# Patient Record
Sex: Female | Born: 1940 | Race: Black or African American | Hispanic: No | Smoking: Never smoker
Health system: Southern US, Community
[De-identification: ages and names within clinical notes are randomized; demographics above are authoritative.]

## PROBLEM LIST (undated history)

## (undated) DIAGNOSIS — I10 Essential (primary) hypertension: Secondary | ICD-10-CM

## (undated) DIAGNOSIS — M1990 Unspecified osteoarthritis, unspecified site: Secondary | ICD-10-CM

## (undated) DIAGNOSIS — I4891 Unspecified atrial fibrillation: Secondary | ICD-10-CM

## (undated) DIAGNOSIS — E079 Disorder of thyroid, unspecified: Secondary | ICD-10-CM

## (undated) DIAGNOSIS — E669 Obesity, unspecified: Secondary | ICD-10-CM

## (undated) DIAGNOSIS — E119 Type 2 diabetes mellitus without complications: Secondary | ICD-10-CM

## (undated) HISTORY — DX: Essential (primary) hypertension: I10

## (undated) HISTORY — DX: Type 2 diabetes mellitus without complications: E11.9

## (undated) HISTORY — DX: Disorder of thyroid, unspecified: E07.9

## (undated) HISTORY — DX: Obesity, unspecified: E66.9

## (undated) HISTORY — DX: Unspecified atrial fibrillation: I48.91

## (undated) HISTORY — PX: EYE SURGERY: SHX253

## (undated) HISTORY — PX: FOOT SURGERY: SHX648

## (undated) HISTORY — PX: TUBAL LIGATION: SHX77

## (undated) HISTORY — DX: Unspecified osteoarthritis, unspecified site: M19.90

## (undated) HISTORY — PX: ABDOMINAL HYSTERECTOMY: SHX81

## (undated) HISTORY — PX: ANKLE FRACTURE SURGERY: SHX122

## (undated) HISTORY — PX: GALLBLADDER SURGERY: SHX652

---

## 1998-03-07 ENCOUNTER — Other Ambulatory Visit: Admission: RE | Admit: 1998-03-07 | Discharge: 1998-03-07 | Payer: Self-pay | Admitting: Internal Medicine

## 1998-05-09 ENCOUNTER — Ambulatory Visit (HOSPITAL_COMMUNITY): Admission: RE | Admit: 1998-05-09 | Discharge: 1998-05-09 | Payer: Self-pay | Admitting: Internal Medicine

## 1998-05-13 ENCOUNTER — Other Ambulatory Visit: Admission: RE | Admit: 1998-05-13 | Discharge: 1998-05-13 | Payer: Self-pay | Admitting: Internal Medicine

## 1998-06-27 ENCOUNTER — Ambulatory Visit (HOSPITAL_COMMUNITY): Admission: RE | Admit: 1998-06-27 | Discharge: 1998-06-27 | Payer: Self-pay | Admitting: Internal Medicine

## 1999-03-23 ENCOUNTER — Emergency Department (HOSPITAL_COMMUNITY): Admission: EM | Admit: 1999-03-23 | Discharge: 1999-03-23 | Payer: Self-pay | Admitting: Emergency Medicine

## 1999-05-16 ENCOUNTER — Ambulatory Visit (HOSPITAL_COMMUNITY): Admission: RE | Admit: 1999-05-16 | Discharge: 1999-05-16 | Payer: Self-pay | Admitting: Internal Medicine

## 1999-07-05 ENCOUNTER — Other Ambulatory Visit: Admission: RE | Admit: 1999-07-05 | Discharge: 1999-07-05 | Payer: Self-pay | Admitting: Internal Medicine

## 2000-06-11 ENCOUNTER — Ambulatory Visit (HOSPITAL_COMMUNITY): Admission: RE | Admit: 2000-06-11 | Discharge: 2000-06-11 | Payer: Self-pay | Admitting: Internal Medicine

## 2000-06-11 ENCOUNTER — Encounter: Payer: Self-pay | Admitting: Internal Medicine

## 2001-02-28 ENCOUNTER — Encounter: Payer: Self-pay | Admitting: Emergency Medicine

## 2001-02-28 ENCOUNTER — Emergency Department (HOSPITAL_COMMUNITY): Admission: EM | Admit: 2001-02-28 | Discharge: 2001-02-28 | Payer: Self-pay | Admitting: Emergency Medicine

## 2001-06-19 ENCOUNTER — Encounter: Payer: Self-pay | Admitting: Internal Medicine

## 2001-06-19 ENCOUNTER — Ambulatory Visit (HOSPITAL_COMMUNITY): Admission: RE | Admit: 2001-06-19 | Discharge: 2001-06-19 | Payer: Self-pay | Admitting: Internal Medicine

## 2001-08-22 ENCOUNTER — Ambulatory Visit (HOSPITAL_BASED_OUTPATIENT_CLINIC_OR_DEPARTMENT_OTHER): Admission: RE | Admit: 2001-08-22 | Discharge: 2001-08-22 | Payer: Self-pay | Admitting: Orthopedic Surgery

## 2002-07-15 ENCOUNTER — Encounter: Payer: Self-pay | Admitting: Internal Medicine

## 2002-07-15 ENCOUNTER — Ambulatory Visit (HOSPITAL_COMMUNITY): Admission: RE | Admit: 2002-07-15 | Discharge: 2002-07-15 | Payer: Self-pay | Admitting: Internal Medicine

## 2003-09-14 ENCOUNTER — Encounter: Payer: Self-pay | Admitting: Internal Medicine

## 2003-09-14 ENCOUNTER — Ambulatory Visit (HOSPITAL_COMMUNITY): Admission: RE | Admit: 2003-09-14 | Discharge: 2003-09-14 | Payer: Self-pay | Admitting: Internal Medicine

## 2003-11-15 ENCOUNTER — Emergency Department (HOSPITAL_COMMUNITY): Admission: EM | Admit: 2003-11-15 | Discharge: 2003-11-15 | Payer: Self-pay | Admitting: Emergency Medicine

## 2004-08-29 ENCOUNTER — Ambulatory Visit (HOSPITAL_BASED_OUTPATIENT_CLINIC_OR_DEPARTMENT_OTHER): Admission: RE | Admit: 2004-08-29 | Discharge: 2004-08-29 | Payer: Self-pay | Admitting: Orthopedic Surgery

## 2004-08-29 ENCOUNTER — Ambulatory Visit (HOSPITAL_COMMUNITY): Admission: RE | Admit: 2004-08-29 | Discharge: 2004-08-29 | Payer: Self-pay | Admitting: Orthopedic Surgery

## 2006-01-24 ENCOUNTER — Ambulatory Visit (HOSPITAL_COMMUNITY): Admission: RE | Admit: 2006-01-24 | Discharge: 2006-01-24 | Payer: Self-pay | Admitting: Internal Medicine

## 2007-03-05 ENCOUNTER — Ambulatory Visit (HOSPITAL_COMMUNITY): Admission: RE | Admit: 2007-03-05 | Discharge: 2007-03-05 | Payer: Self-pay | Admitting: Internal Medicine

## 2008-09-14 ENCOUNTER — Ambulatory Visit (HOSPITAL_COMMUNITY): Admission: RE | Admit: 2008-09-14 | Discharge: 2008-09-14 | Payer: Self-pay | Admitting: Internal Medicine

## 2008-11-15 ENCOUNTER — Ambulatory Visit (HOSPITAL_COMMUNITY): Admission: RE | Admit: 2008-11-15 | Discharge: 2008-11-15 | Payer: Self-pay | Admitting: Internal Medicine

## 2009-02-23 ENCOUNTER — Encounter (INDEPENDENT_AMBULATORY_CARE_PROVIDER_SITE_OTHER): Payer: Self-pay | Admitting: Surgery

## 2009-02-24 ENCOUNTER — Inpatient Hospital Stay (HOSPITAL_COMMUNITY): Admission: RE | Admit: 2009-02-24 | Discharge: 2009-02-25 | Payer: Self-pay | Admitting: Surgery

## 2009-08-18 ENCOUNTER — Emergency Department (HOSPITAL_COMMUNITY): Admission: EM | Admit: 2009-08-18 | Discharge: 2009-08-18 | Payer: Self-pay | Admitting: Family Medicine

## 2009-08-19 ENCOUNTER — Inpatient Hospital Stay (HOSPITAL_COMMUNITY): Admission: EM | Admit: 2009-08-19 | Discharge: 2009-08-23 | Payer: Self-pay | Admitting: Emergency Medicine

## 2009-09-15 ENCOUNTER — Ambulatory Visit (HOSPITAL_COMMUNITY): Admission: RE | Admit: 2009-09-15 | Discharge: 2009-09-15 | Payer: Self-pay | Admitting: Internal Medicine

## 2010-06-07 ENCOUNTER — Inpatient Hospital Stay (HOSPITAL_COMMUNITY): Admission: EM | Admit: 2010-06-07 | Discharge: 2010-06-11 | Payer: Self-pay | Admitting: Emergency Medicine

## 2010-09-18 ENCOUNTER — Emergency Department (HOSPITAL_COMMUNITY)
Admission: EM | Admit: 2010-09-18 | Discharge: 2010-09-19 | Payer: Self-pay | Source: Home / Self Care | Admitting: Emergency Medicine

## 2010-09-19 ENCOUNTER — Ambulatory Visit (HOSPITAL_COMMUNITY): Admission: RE | Admit: 2010-09-19 | Discharge: 2010-09-19 | Payer: Self-pay | Admitting: Internal Medicine

## 2010-12-16 ENCOUNTER — Encounter: Payer: Self-pay | Admitting: Internal Medicine

## 2010-12-17 ENCOUNTER — Encounter: Payer: Self-pay | Admitting: Internal Medicine

## 2011-02-07 LAB — BASIC METABOLIC PANEL
BUN: 15 mg/dL (ref 6–23)
Chloride: 109 mEq/L (ref 96–112)
Creatinine, Ser: 0.88 mg/dL (ref 0.4–1.2)
GFR calc Af Amer: >60 mL/min (ref >60)
GFR calc non Af Amer: >60 mL/min (ref >60)
Glucose, Bld: 141 mg/dL — ABNORMAL HIGH (ref 70–99)
Potassium: 3.7 mEq/L (ref 3.5–5.1)
Sodium: 143 mEq/L (ref 135–145)

## 2011-02-07 LAB — POCT CARDIAC MARKERS
CKMB, poc: <1 ng/mL — ABNORMAL LOW (ref 1.0–8.0)
Myoglobin, poc: 46.4 ng/mL (ref 12–200)
Troponin i, poc: <0.05 ng/mL (ref 0.00–0.09)

## 2011-02-07 LAB — DIFFERENTIAL
Basophils Relative: 0 % (ref 0–1)
Eosinophils Relative: 1 % (ref 0–5)
Lymphocytes Relative: 33 % (ref 12–46)
Monocytes Absolute: 0.5 10*3/uL (ref 0.1–1.0)
Monocytes Relative: 9 % (ref 3–12)

## 2011-02-07 LAB — CBC
MCH: 30.2 pg (ref 26.0–34.0)
MCHC: 32.4 g/dL (ref 30.0–36.0)
Platelets: 217 10*3/uL (ref 150–400)
WBC: 5.5 10*3/uL (ref 4.0–10.5)

## 2011-02-10 LAB — COMPREHENSIVE METABOLIC PANEL
ALT: 16 U/L (ref 0–35)
AST: 29 U/L (ref 0–37)
CO2: 28 mEq/L (ref 19–32)
Chloride: 102 mEq/L (ref 96–112)
Creatinine, Ser: 0.97 mg/dL (ref 0.4–1.2)
GFR calc Af Amer: >60 mL/min (ref >60)
GFR calc non Af Amer: 57 mL/min — ABNORMAL LOW (ref >60)
Total Bilirubin: 0.4 mg/dL (ref 0.3–1.2)

## 2011-02-10 LAB — BASIC METABOLIC PANEL
GFR calc Af Amer: >60 mL/min (ref >60)
GFR calc non Af Amer: 55 mL/min — ABNORMAL LOW (ref >60)
Glucose, Bld: 153 mg/dL — ABNORMAL HIGH (ref 70–99)
Potassium: 5.1 mEq/L (ref 3.5–5.1)

## 2011-02-10 LAB — VITAMIN D 1,25 DIHYDROXY
Vitamin D 1, 25 (OH)2 Total: 32 pg/mL (ref 18–72)
Vitamin D2 1, 25 (OH)2: <8 pg/mL
Vitamin D3 1, 25 (OH)2: 32 pg/mL

## 2011-02-10 LAB — GLUCOSE, CAPILLARY
Glucose-Capillary: 124 mg/dL — ABNORMAL HIGH (ref 70–99)
Glucose-Capillary: 127 mg/dL — ABNORMAL HIGH (ref 70–99)
Glucose-Capillary: 131 mg/dL — ABNORMAL HIGH (ref 70–99)
Glucose-Capillary: 158 mg/dL — ABNORMAL HIGH (ref 70–99)

## 2011-02-10 LAB — VITAMIN D 25 HYDROXY (VIT D DEFICIENCY, FRACTURES): Vit D, 25-Hydroxy: 13 ng/mL — ABNORMAL LOW (ref 30–89)

## 2011-02-10 LAB — CBC
HCT: 26.3 % — ABNORMAL LOW (ref 36.0–46.0)
Hemoglobin: 10.1 g/dL — ABNORMAL LOW (ref 12.0–15.0)
Hemoglobin: 8.9 g/dL — ABNORMAL LOW (ref 12.0–15.0)
MCH: 32.5 pg (ref 26.0–34.0)
MCV: 96.1 fL (ref 78.0–100.0)
RBC: 2.74 MIL/uL — ABNORMAL LOW (ref 3.87–5.11)

## 2011-02-11 LAB — GLUCOSE, CAPILLARY
Glucose-Capillary: 113 mg/dL — ABNORMAL HIGH (ref 70–99)
Glucose-Capillary: 114 mg/dL — ABNORMAL HIGH (ref 70–99)
Glucose-Capillary: 125 mg/dL — ABNORMAL HIGH (ref 70–99)
Glucose-Capillary: 133 mg/dL — ABNORMAL HIGH (ref 70–99)
Glucose-Capillary: 162 mg/dL — ABNORMAL HIGH (ref 70–99)

## 2011-02-11 LAB — DIFFERENTIAL
Eosinophils Absolute: 0 10*3/uL (ref 0.0–0.7)
Eosinophils Relative: 1 % (ref 0–5)
Lymphocytes Relative: 34 % (ref 12–46)
Lymphs Abs: 2.7 10*3/uL (ref 0.7–4.0)
Monocytes Relative: 7 % (ref 3–12)
Neutrophils Relative %: 58 % (ref 43–77)

## 2011-02-11 LAB — COMPREHENSIVE METABOLIC PANEL
ALT: 24 U/L (ref 0–35)
AST: 28 U/L (ref 0–37)
AST: 36 U/L (ref 0–37)
BUN: 7 mg/dL (ref 6–23)
CO2: 28 mEq/L (ref 19–32)
Calcium: 8.7 mg/dL (ref 8.4–10.5)
Calcium: 8.8 mg/dL (ref 8.4–10.5)
Creatinine, Ser: 0.95 mg/dL (ref 0.4–1.2)
GFR calc Af Amer: 59 mL/min — ABNORMAL LOW (ref >60)
GFR calc Af Amer: >60 mL/min (ref >60)
GFR calc non Af Amer: 59 mL/min — ABNORMAL LOW (ref >60)
Sodium: 137 mEq/L (ref 135–145)
Total Protein: 7.1 g/dL (ref 6.0–8.3)

## 2011-02-11 LAB — CBC
MCHC: 33.8 g/dL (ref 30.0–36.0)
RDW: 13.9 % (ref 11.5–15.5)

## 2011-02-11 LAB — PROTIME-INR: Prothrombin Time: 13.3 seconds (ref 11.6–15.2)

## 2011-03-02 LAB — COMPREHENSIVE METABOLIC PANEL
ALT: 159 U/L — ABNORMAL HIGH (ref 0–35)
ALT: 324 U/L — ABNORMAL HIGH (ref 0–35)
ALT: 577 U/L — ABNORMAL HIGH (ref 0–35)
AST: 176 U/L — ABNORMAL HIGH (ref 0–37)
AST: 419 U/L — ABNORMAL HIGH (ref 0–37)
AST: 742 U/L — ABNORMAL HIGH (ref 0–37)
Albumin: 3.9 g/dL (ref 3.5–5.2)
Albumin: 4.1 g/dL (ref 3.5–5.2)
Alkaline Phosphatase: 178 U/L — ABNORMAL HIGH (ref 39–117)
Alkaline Phosphatase: 257 U/L — ABNORMAL HIGH (ref 39–117)
BUN: 14 mg/dL (ref 6–23)
CO2: 26 mEq/L (ref 19–32)
CO2: 28 mEq/L (ref 19–32)
Calcium: 8.6 mg/dL (ref 8.4–10.5)
Chloride: 101 mEq/L (ref 96–112)
Chloride: 103 mEq/L (ref 96–112)
Chloride: 104 mEq/L (ref 96–112)
Creatinine, Ser: 0.88 mg/dL (ref 0.4–1.2)
Creatinine, Ser: 1.19 mg/dL (ref 0.4–1.2)
GFR calc Af Amer: 55 mL/min — ABNORMAL LOW (ref >60)
GFR calc Af Amer: >60 mL/min (ref >60)
GFR calc Af Amer: >60 mL/min (ref >60)
GFR calc non Af Amer: 52 mL/min — ABNORMAL LOW (ref >60)
GFR calc non Af Amer: >60 mL/min (ref >60)
Glucose, Bld: 127 mg/dL — ABNORMAL HIGH (ref 70–99)
Glucose, Bld: 80 mg/dL (ref 70–99)
Glucose, Bld: 87 mg/dL (ref 70–99)
Potassium: 3.5 mEq/L (ref 3.5–5.1)
Potassium: 3.9 mEq/L (ref 3.5–5.1)
Sodium: 139 mEq/L (ref 135–145)
Sodium: 140 mEq/L (ref 135–145)
Sodium: 145 mEq/L (ref 135–145)
Total Bilirubin: 5.2 mg/dL — ABNORMAL HIGH (ref 0.3–1.2)
Total Bilirubin: 6 mg/dL — ABNORMAL HIGH (ref 0.3–1.2)
Total Bilirubin: 6.3 mg/dL — ABNORMAL HIGH (ref 0.3–1.2)
Total Protein: 5.5 g/dL — ABNORMAL LOW (ref 6.0–8.3)
Total Protein: 5.6 g/dL — ABNORMAL LOW (ref 6.0–8.3)

## 2011-03-02 LAB — GLUCOSE, CAPILLARY
Glucose-Capillary: 101 mg/dL — ABNORMAL HIGH (ref 70–99)
Glucose-Capillary: 102 mg/dL — ABNORMAL HIGH (ref 70–99)
Glucose-Capillary: 106 mg/dL — ABNORMAL HIGH (ref 70–99)
Glucose-Capillary: 108 mg/dL — ABNORMAL HIGH (ref 70–99)
Glucose-Capillary: 132 mg/dL — ABNORMAL HIGH (ref 70–99)
Glucose-Capillary: 133 mg/dL — ABNORMAL HIGH (ref 70–99)
Glucose-Capillary: 140 mg/dL — ABNORMAL HIGH (ref 70–99)
Glucose-Capillary: 149 mg/dL — ABNORMAL HIGH (ref 70–99)
Glucose-Capillary: 69 mg/dL — ABNORMAL LOW (ref 70–99)
Glucose-Capillary: 78 mg/dL (ref 70–99)
Glucose-Capillary: 85 mg/dL (ref 70–99)
Glucose-Capillary: 86 mg/dL (ref 70–99)
Glucose-Capillary: 88 mg/dL (ref 70–99)
Glucose-Capillary: 93 mg/dL (ref 70–99)
Glucose-Capillary: 96 mg/dL (ref 70–99)
Glucose-Capillary: 99 mg/dL (ref 70–99)

## 2011-03-02 LAB — BASIC METABOLIC PANEL
BUN: 3 mg/dL — ABNORMAL LOW (ref 6–23)
CO2: 29 mEq/L (ref 19–32)
Calcium: 8.5 mg/dL (ref 8.4–10.5)
Chloride: 112 mEq/L (ref 96–112)
Creatinine, Ser: 0.96 mg/dL (ref 0.4–1.2)
GFR calc Af Amer: >60 mL/min (ref >60)
GFR calc non Af Amer: 58 mL/min — ABNORMAL LOW (ref >60)
Glucose, Bld: 130 mg/dL — ABNORMAL HIGH (ref 70–99)
Potassium: 3.6 mEq/L (ref 3.5–5.1)
Sodium: 144 mEq/L (ref 135–145)

## 2011-03-02 LAB — URINE MICROSCOPIC-ADD ON

## 2011-03-02 LAB — HEPATIC FUNCTION PANEL
ALT: 213 U/L — ABNORMAL HIGH (ref 0–35)
ALT: 242 U/L — ABNORMAL HIGH (ref 0–35)
AST: 86 U/L — ABNORMAL HIGH (ref 0–37)
Albumin: 3.1 g/dL — ABNORMAL LOW (ref 3.5–5.2)
Alkaline Phosphatase: 166 U/L — ABNORMAL HIGH (ref 39–117)
Bilirubin, Direct: 0.5 mg/dL — ABNORMAL HIGH (ref 0.0–0.3)
Indirect Bilirubin: 0.5 mg/dL (ref 0.3–0.9)
Indirect Bilirubin: 0.7 mg/dL (ref 0.3–0.9)
Total Bilirubin: 1 mg/dL (ref 0.3–1.2)
Total Protein: 5.5 g/dL — ABNORMAL LOW (ref 6.0–8.3)
Total Protein: 6.3 g/dL (ref 6.0–8.3)

## 2011-03-02 LAB — CBC
HCT: 23.5 % — ABNORMAL LOW (ref 36.0–46.0)
HCT: 33.5 % — ABNORMAL LOW (ref 36.0–46.0)
Hemoglobin: 11.4 g/dL — ABNORMAL LOW (ref 12.0–15.0)
Hemoglobin: 8.1 g/dL — ABNORMAL LOW (ref 12.0–15.0)
MCHC: 34.3 g/dL (ref 30.0–36.0)
MCHC: 34.5 g/dL (ref 30.0–36.0)
MCV: 93.8 fL (ref 78.0–100.0)
MCV: 94.7 fL (ref 78.0–100.0)
MCV: 94.8 fL (ref 78.0–100.0)
Platelets: 162 10*3/uL (ref 150–400)
Platelets: 208 10*3/uL (ref 150–400)
RBC: 2.48 MIL/uL — ABNORMAL LOW (ref 3.87–5.11)
RDW: 14.9 % (ref 11.5–15.5)
RDW: 14.9 % (ref 11.5–15.5)
WBC: 10.3 10*3/uL (ref 4.0–10.5)
WBC: 12.3 10*3/uL — ABNORMAL HIGH (ref 4.0–10.5)
WBC: 6.2 10*3/uL (ref 4.0–10.5)

## 2011-03-02 LAB — URINALYSIS, ROUTINE W REFLEX MICROSCOPIC
Glucose, UA: NEGATIVE mg/dL
Ketones, ur: 40 mg/dL — AB
Protein, ur: 30 mg/dL — AB

## 2011-03-02 LAB — LIPASE, BLOOD: Lipase: 26 U/L (ref 11–59)

## 2011-03-02 LAB — DIFFERENTIAL
Basophils Absolute: 0 10*3/uL (ref 0.0–0.1)
Eosinophils Relative: 0 % (ref 0–5)
Lymphocytes Relative: 2 % — ABNORMAL LOW (ref 12–46)
Lymphs Abs: 0.3 10*3/uL — ABNORMAL LOW (ref 0.7–4.0)
Monocytes Absolute: 0.6 10*3/uL (ref 0.1–1.0)
Monocytes Relative: 5 % (ref 3–12)

## 2011-03-02 LAB — RAPID URINE DRUG SCREEN, HOSP PERFORMED
Amphetamines: NOT DETECTED
Barbiturates: NOT DETECTED
Benzodiazepines: NOT DETECTED
Cocaine: NOT DETECTED

## 2011-03-02 LAB — POCT URINALYSIS DIP (DEVICE): Ketones, ur: 80 mg/dL — AB

## 2011-03-02 LAB — ACETAMINOPHEN LEVEL: Acetaminophen (Tylenol), Serum: <10 ug/mL — ABNORMAL LOW (ref 10–30)

## 2011-03-02 LAB — APTT: aPTT: 24 seconds (ref 24–37)

## 2011-03-02 LAB — URINE CULTURE: Colony Count: 50000

## 2011-03-02 LAB — CULTURE, BLOOD (ROUTINE X 2): Culture: NO GROWTH

## 2011-03-02 LAB — HEPATITIS B SURFACE ANTIGEN: Hepatitis B Surface Ag: NEGATIVE

## 2011-03-02 LAB — POCT CARDIAC MARKERS: Myoglobin, poc: 147 ng/mL (ref 12–200)

## 2011-03-02 LAB — CALCIUM: Calcium: 8.1 mg/dL — ABNORMAL LOW (ref 8.4–10.5)

## 2011-03-07 LAB — CBC
Hemoglobin: 8.3 g/dL — ABNORMAL LOW (ref 12.0–15.0)
MCHC: 34.1 g/dL (ref 30.0–36.0)
RBC: 2.61 MIL/uL — ABNORMAL LOW (ref 3.87–5.11)
WBC: 8.2 10*3/uL (ref 4.0–10.5)

## 2011-03-07 LAB — COMPREHENSIVE METABOLIC PANEL
ALT: 35 U/L (ref 0–35)
AST: 40 U/L — ABNORMAL HIGH (ref 0–37)
Alkaline Phosphatase: 59 U/L (ref 39–117)
CO2: 30 mEq/L (ref 19–32)
Calcium: 8.3 mg/dL — ABNORMAL LOW (ref 8.4–10.5)
Chloride: 107 mEq/L (ref 96–112)
GFR calc Af Amer: >60 mL/min (ref >60)
GFR calc non Af Amer: 60 mL/min — ABNORMAL LOW (ref >60)
Glucose, Bld: 125 mg/dL — ABNORMAL HIGH (ref 70–99)
Potassium: 3.1 mEq/L — ABNORMAL LOW (ref 3.5–5.1)
Sodium: 141 mEq/L (ref 135–145)
Total Bilirubin: 0.6 mg/dL (ref 0.3–1.2)

## 2011-03-08 LAB — GLUCOSE, CAPILLARY: Glucose-Capillary: 119 mg/dL — ABNORMAL HIGH (ref 70–99)

## 2011-03-08 LAB — BASIC METABOLIC PANEL
BUN: 13 mg/dL (ref 6–23)
Calcium: 8.4 mg/dL (ref 8.4–10.5)
Chloride: 100 mEq/L (ref 96–112)
Creatinine, Ser: 1.2 mg/dL (ref 0.4–1.2)
GFR calc Af Amer: 54 mL/min — ABNORMAL LOW (ref >60)
GFR calc non Af Amer: 45 mL/min — ABNORMAL LOW (ref >60)

## 2011-03-08 LAB — HEMOGLOBIN AND HEMATOCRIT, BLOOD
HCT: 28.3 % — ABNORMAL LOW (ref 36.0–46.0)
Hemoglobin: 9.6 g/dL — ABNORMAL LOW (ref 12.0–15.0)

## 2011-04-10 NOTE — Discharge Summary (Signed)
NAMEMEERA, Darlene Boyer              ACCOUNT NO.:  [id number]   MEDICAL RECORD NO.:  [id number]          PATIENT TYPE:  INP   LOCATION:  1523                         FACILITY:  [facility]   PHYSICIAN:  Thornton Park. Daphine Deutscher, MD  DATE OF BIRTH:  1941-06-20   DATE OF ADMISSION:  02/23/2009  DATE OF DISCHARGE:  02/25/2009                               DISCHARGE SUMMARY   ADMISSION DIAGNOSIS:  Abdominal pain and gallstones.   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   PROCEDURE:  Laparoscopic cholecystectomy with intraoperative  cholangiogram.   HOSPITAL COURSE:  Darlene Boyer is a 70 year old African American lady  with abdominal pain and gallstones.  She had fairly remarkable  cholecystitis of a chronic nature which was taken care of surgically.  She stayed through postop day two and was ready for discharge.  She had  a low-grade fever the night before but constitutionally did not have any  complaints.  Laboratory on the day of admission revealed that her anemia  persisted at this time with hemoglobin 8.3, white count 8200 and  potassium 3.1.  She was encouraged to increase bananas and other organic  sources of potassium chloride in her diet.  More importantly, she has  been requested to follow up with Dr. Margaretmary Bayley to schedule anemia  workup which may include colonoscopy.  I mentioned that to her as well.  We will encourage her to take some ferrous sulfate in the meantime.  I  asked her to come back to see me in the office in about three weeks.  She was given a prescription for Tylox to take for pain.   DISCHARGE MEDICATIONS:  1. Hyzaar 100/12.5.  2. Arthrotec 75 mg.  3. Lorazepam.  4. Tylenol p.r.n.      Thornton Park Daphine Deutscher, MD  Electronically Signed     MBM/MEDQ  D:  02/25/2009  T:  02/25/2009  Job:  [id number]

## 2011-04-10 NOTE — Op Note (Signed)
NAMENOELANI, HARBACH              ACCOUNT NO.:  [id number]   MEDICAL RECORD NO.:  [id number]          PATIENT TYPE:  OIB   LOCATION:  1523                         FACILITY:  [facility]   PHYSICIAN:  Thornton Park. Daphine Deutscher, MD  DATE OF BIRTH:  [birth year]   DATE OF PROCEDURE:  02/23/2009  DATE OF DISCHARGE:                               OPERATIVE REPORT   PREOPERATIVE DIAGNOSIS:  Chronic cholecystitis.   POSTOPERATIVE DIAGNOSES:  Severe chronic cholecystitis with numerous  adhesions and subacute changes of the gallbladder consistent with  chronically impacted gallstones in the neck of the gallbladder.   PROCEDURE:  Laparoscopic cholecystectomy with enterolysis and  intraoperative cholangiogram.   SURGEON:  Thornton Park. Daphine Deutscher, MD   ASSISTANT:  Currie Paris, M.D.   ANESTHESIA:  General.   DESCRIPTION OF PROCEDURE:  A 70 year old African American lady with  upper abdominal pain and gallstones.  She was taken to OR one on February 23, 2009, given general anesthesia. The abdomen was prepped with the  [facility] equipment and draped sterilely.  Access to the abdomen was  gained through the umbilicus, cutting down into the umbilicus.  She had  a lot of scar tissue there and she had a wound that healed secondarily,  but we entered and then found a bunch of adhesions.  After placing the  multiple trocars for our gallbladder I went ahead and took those down  sharply.  The gallbladder itself was adherent to the colon up on the  fundus and we took those adhesions away.  Elevated the gallbladder and  then stripped it down to the neck where it was really adherent and the  anatomy was somewhat confusing.  The neck of the gallbladder was  followed down to what was a very big cystic duct into the common duct.  The critical view was achieved.  I went ahead and made an incision up of  the gallbladder and cystic duct and milked back a lot of pasty material  and small stones.  I then did a dynamic  cholangiogram, inserting the  Reddick catheter 2 cm into the common duct and taking that showing a  very dilated common duct with flow into the duodenum.  There may be what  was sludge in the common duct that may need to be retrieved  endoscopically with ERCP later.  In the meantime, we went ahead and did  debulked the cystic duct remnants as best we could of the particulate  matter consistent with old stones.  The cystic duct was then ligated  with a PDS Lapra-Ty and was transected.  The gallbladder was then  removed from the gallbladder bed with hook electrocautery and placed in  a bag and brought out through the umbilicus.  I irrigated and removed  some of the material that spilled and seemed to get all of that.  There  was no bleeding or bile leaks noted.  The umbilical defect  was repaired with laparoscopic vision with three sutures of zero Vicryl,  4-0 Vicryl used in the skin.  The patient was awakened and taken to the  recovery room  in satisfactory condition.   FINAL DIAGNOSES:  Severe chronic cholecystitis, subacute cholecystitis.      Thornton Park Daphine Deutscher, MD  Electronically Signed     MBM/MEDQ  D:  02/23/2009  T:  02/23/2009  Job:  [id number]   cc:   Margaretmary Bayley, M.D.  Fax: [phone]

## 2011-04-13 NOTE — Op Note (Signed)
[name], Darlene Boyer              ACCOUNT NO.:  [id number]   MEDICAL RECORD NO.:  [id number]          PATIENT TYPE:  AMB   LOCATION:  DSC                          FACILITY:  [facility]   PHYSICIAN:  Katy Fitch. Sypher Montez Hageman., M.D.DATE OF BIRTH:  [birth year]   DATE OF PROCEDURE:  08/29/2004  DATE OF DISCHARGE:                                 OPERATIVE REPORT   PREOPERATIVE DIAGNOSES:  1.  Stenosing tenosynovitis, right ring finger at A-1 pulley.  2.  Mass A-1, A-2 pulleys, right ring finger consistent with mucoid      cyst/ganglion cyst.   POSTOPERATIVE DIAGNOSES:  1.  Stenosing tenosynovitis, right ring finger at A-1 pulley.  2.  Mass A-1, A-2 pulleys, right ring finger consistent with mucoid      cyst/ganglion cyst.   OPERATION PERFORMED:  1.  Release of right ring finger A-1 pulley and debridement of necrotic      superficialis tendon.  2.  Excision of flexor sheath mucoid tumor.   SURGEON:  Katy Fitch. Sypher, M.D.   ASSISTANT:  Jonni Sanger, P.A.   ANESTHESIA:  General sedation and 0.25% Marcaine, 2% lidocaine metacarpal  head level block, right ring finger.   SUPERVISING ANESTHESIOLOGIST:  Zenon Mayo, MD   INDICATIONS FOR PROCEDURE:  [name] is a 70 year old woman referred  by Dr. Margaretmary Bayley for evaluation and management of a painful mass  involving her right palm overlying the ring finger flexor sheath.  She had  active stenosing tenosynovitis noted.  Clinical examination suggested a  mucoid tumor/ganglion at the A1-A2 pulley junction and evidence of stenosing  tenosynovitis.  Due to failure to respond to nonoperative measures, the  patient is brought to the operating room at this time for release of her  right finger A-1 pulley and excision of her ring finger ganglion.   DESCRIPTION OF PROCEDURE:  [name] was brought to the operating room  and placed in supine position on the operating table.  Following sedation,  the right arm was prepped with  Betadine soap and solution and sterilely  draped.  0.25% Marcaine and 2% lidocaine were infiltrated at the metacarpal  head level to obtain a digital block.  When anesthesia was satisfactory, the  procedure commenced with a short incision transversely just distal to the  distal palmar crease.  Subcutaneous tissues were carefully divided revealing  the flexor sheath of the ring finger.  There was noted to be a sizable  mucoid tumor measuring 1 cm in length and 8 mm in width and about 5 mm in  height.  This was circumferentially dissected and removed with a rongeur  down to the A-2 pulley, A-1 pulley junction.  No other masses or  predicaments were noted.  The A-1 pulley was then split with scissors.  The  flexor tendons were delivered and inspected.  The superficialis tendon had  an area of necrosis that was debrided with a  rongeur.  The wound was then inspected for bleeding points and repaired with  a mattress suture of 5-0 nylon.  There were no apparent complications.  Ms.  Monte  tolerated the surgery and anesthesia well.  She was transferred to  recovery room with stable vital signs.       RVS/MEDQ  D:  08/29/2004  T:  08/29/2004  Job:  [id number]

## 2011-04-13 NOTE — Op Note (Signed)
[place]  Patient:    Darlene Boyer, Darlene Boyer Visit Number: [id number] MRN: [id number]          Service Type: DSU Location: [facility] Attending Physician:  Susa Day Dictated by:   Katy Fitch Naaman Plummer., M.D. Proc. Date: 08/22/01 Admit Date:  08/22/2001   CC:         Lindell Spar. Chestine Spore, M.D.   Operative Report  PREOPERATIVE DIAGNOSIS:  Stenosing tenosynovitis right first dorsal compartment.  POSTOPERATIVE DIAGNOSIS:  Stenosing tenosynovitis right first dorsal compartment.  OPERATION:  Release of right first dorsal compartment.  SURGEON:  Katy Fitch. Sypher, Montez Hageman., M.D.  ASSISTANT:  Molly Maduro ______, P.A.-C  ANESTHESIA:  Marcaine 0.25% and 2% lidocaine field block of right first dorsal compartment, supplemented with IV sedation, supervised by anesthesiologist, Dr. Gelene Mink.  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  She was referred by her primary care physician, Dr. Chestine Spore, for evaluation and management of this predicament.  Clinical examination revealed a very thickened first dorsal compartment. After informed consent, she is brought to the operating room at this time for release of her right first dorsal compartment.  DESCRIPTION OF PROCEDURE:  [name] is brought to the operating room and placed in supine position on the operating table.  Following placement of a field block, the right arm was prepped with Betadine solution and sterilely draped.  When anesthesia was satisfactory, the procedure commenced with exsanguination of the limb with the Esmarch bandage, and inflation of the arterial tourniquet to 230 mmHg.  Surgery was initiated with a short transverse incision directly over the enlarged compartment.  Subcutaneous tissues were carefully dividing, taking care to identify and gently retract the radial sensory branches.   The compartment was split with scissors and scalpel.  A small septum between the abductor pollicis longus tendon and extensor pollicis brevis tendons was identified and removed with the rongeur. Full range of motion of the wrist and thumb was recovered without discomfort.  The wound was then repaired with intradermal 0 Prolene suture and Steri-Strips applied.  A wound was dressed with xeroform, sterile gauze, and Ace wrap.  [name] is advised to return for followup in approximately 7 to 10 days.  DISCHARGE MEDICATIONS:  She is given a prescription for Percocet one or two tablets p.o. q.4-6h. p.r.n. pain.  She will return to our office for followup in 7 to 10 days, or sooner if any problems. Dictated by:   Katy Fitch Naaman Plummer., M.D. Attending Physician:  Susa Day DD:  08/22/01 TD:  08/22/01 Job: [id number]

## 2011-05-12 IMAGING — RF DG ERCP WO/W SPHINCTEROTOMY
1 series · 2 of 2 positions shown · non-contrast
Comparison: CT abdomen yesterday.  Intraoperative cholangiogram
02/23/2009.

CLINICAL DATA: Acute hepatitis.  Dilated intra and extrahepatic
bile ducts on CT yesterday.  Previous cholecystectomy.

ERCP 08/19/2009:
TECHNIQUE: Multiple spot images obtained with the fluoroscopic
device and submitted for interpretation post-procedure.  ERCP was
performed by Cherradi. Jaunarena.

[Series 1: run · 2 of 2 slices shown]
[im 1/2]
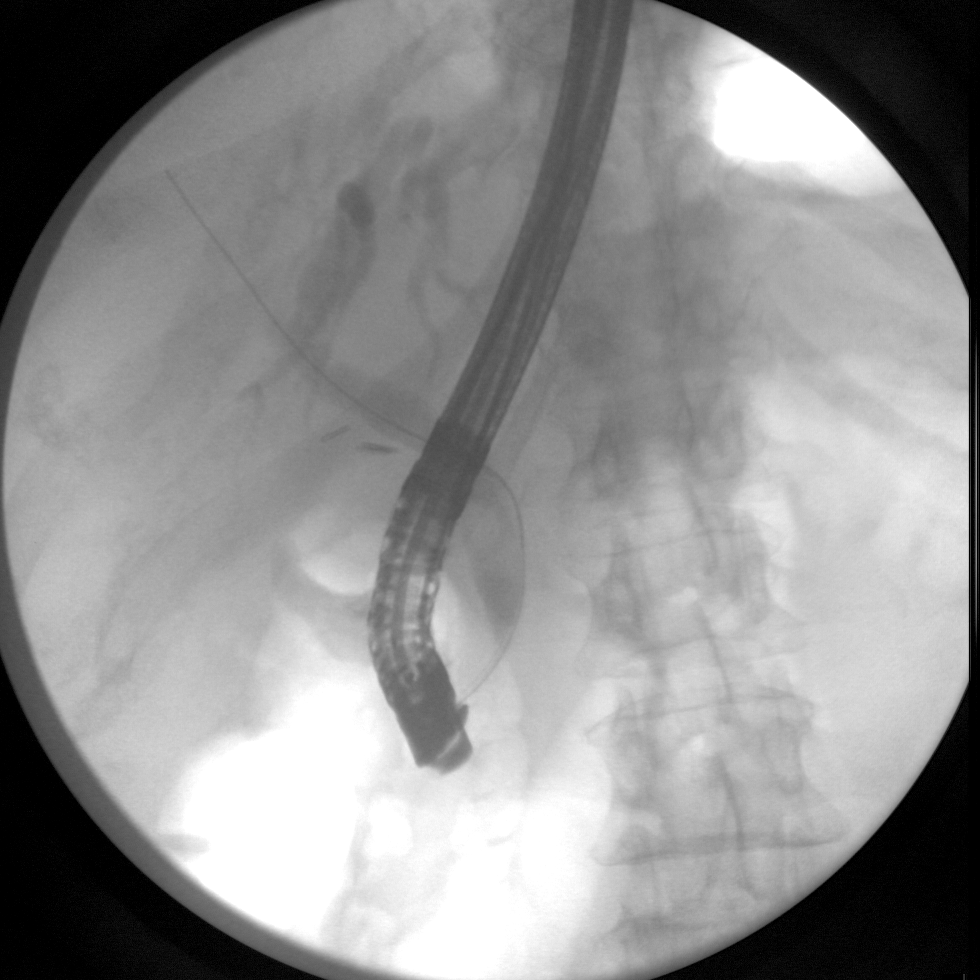
[im 2/2]
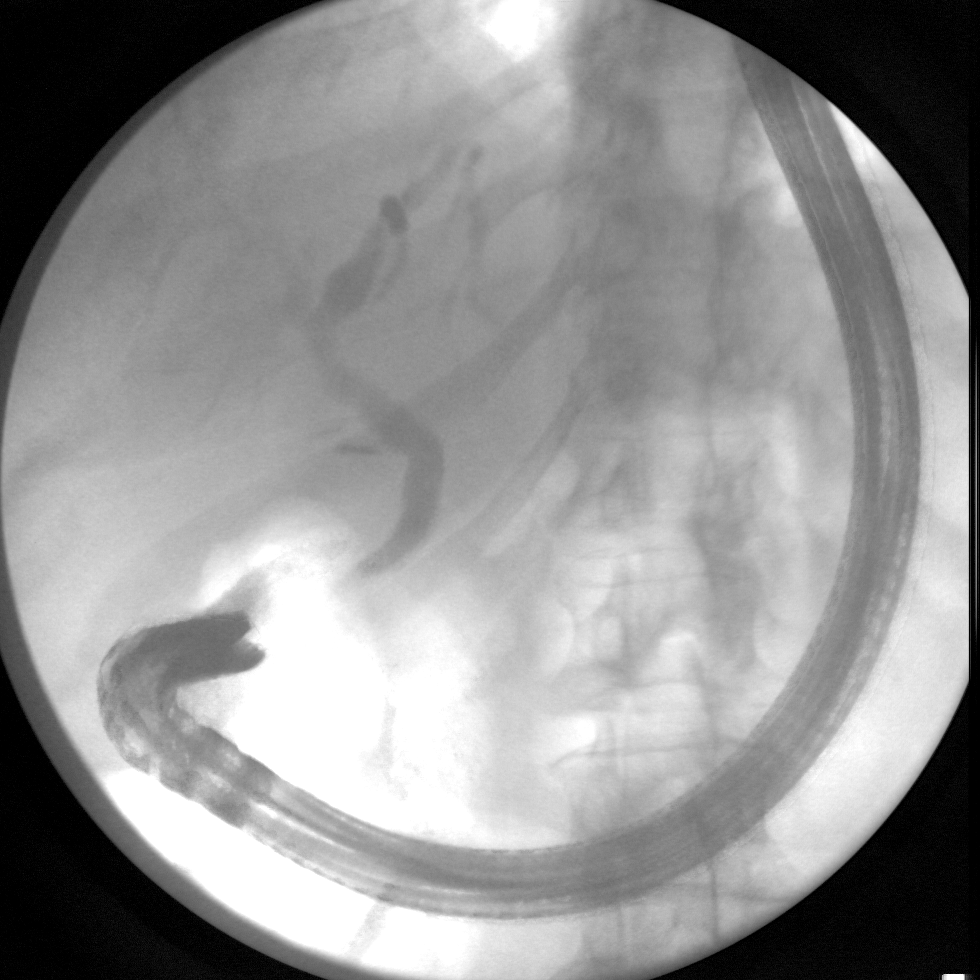

[2 of 2 positions shown; findings below may reference images not displayed]

FINDINGS: 2 spot images from the C-arm fluoroscopic device were
submitted for interpretation post-procedure.  Mild dilation of the
intra and extrahepatic bile ducts.  No visible filling defects or
distal common duct stricture.  The radiologic technologist
documented 4 minutes 33 seconds of fluoroscopy time.
IMPRESSION: Mild intra and extrahepatic biliary ductal dilation without filling
defects or visible common duct stricture on the images submitted.
Please correlate with findings at real time fluoroscopy.

These images were submitted for radiologic interpretation only.
Please see the procedural report for the amount of contrast and the
fluoroscopy time utilized.

## 2011-09-13 ENCOUNTER — Other Ambulatory Visit: Payer: Self-pay | Admitting: Internal Medicine

## 2011-09-13 DIAGNOSIS — Z1231 Encounter for screening mammogram for malignant neoplasm of breast: Secondary | ICD-10-CM

## 2011-10-04 ENCOUNTER — Ambulatory Visit (HOSPITAL_COMMUNITY): Payer: Self-pay | Attending: Internal Medicine

## 2011-11-30 ENCOUNTER — Ambulatory Visit (HOSPITAL_COMMUNITY)
Admission: RE | Admit: 2011-11-30 | Discharge: 2011-11-30 | Disposition: A | Discharge status: Home / Self Care | Payer: Medicare Other | Source: Ambulatory Visit | Attending: Internal Medicine | Admitting: Internal Medicine

## 2011-11-30 DIAGNOSIS — Z1231 Encounter for screening mammogram for malignant neoplasm of breast: Secondary | ICD-10-CM

## 2013-01-02 ENCOUNTER — Other Ambulatory Visit: Payer: Self-pay | Admitting: Internal Medicine

## 2013-01-02 DIAGNOSIS — Z1231 Encounter for screening mammogram for malignant neoplasm of breast: Secondary | ICD-10-CM

## 2013-01-08 ENCOUNTER — Ambulatory Visit (HOSPITAL_COMMUNITY): Admission: RE | Admit: 2013-01-08 | Payer: Medicare Other | Source: Ambulatory Visit

## 2013-01-13 ENCOUNTER — Ambulatory Visit (HOSPITAL_COMMUNITY)
Admission: RE | Admit: 2013-01-13 | Discharge: 2013-01-13 | Disposition: A | Discharge status: Home / Self Care | Payer: Medicare Other | Source: Ambulatory Visit | Attending: Internal Medicine | Admitting: Internal Medicine

## 2013-01-13 DIAGNOSIS — Z1231 Encounter for screening mammogram for malignant neoplasm of breast: Secondary | ICD-10-CM | POA: Insufficient documentation

## 2013-12-16 ENCOUNTER — Other Ambulatory Visit: Payer: Self-pay | Admitting: Internal Medicine

## 2013-12-16 DIAGNOSIS — Z1231 Encounter for screening mammogram for malignant neoplasm of breast: Secondary | ICD-10-CM

## 2014-01-04 ENCOUNTER — Telehealth: Payer: Self-pay | Admitting: *Deleted

## 2014-01-04 NOTE — Telephone Encounter (Signed)
Pt states she's diabetic and her pinkly toe is red and swollen, what can she do until she gets in on 01/19/2014.  Pt states it looks like a corn she had previously.  I instructed pt to use warm epsom salt soaks and to cover hard area with a neosporin bandaid, to keep it from rubbing her shoe. I told her I would see if we could get her in sooner.

## 2014-01-05 ENCOUNTER — Ambulatory Visit (INDEPENDENT_AMBULATORY_CARE_PROVIDER_SITE_OTHER): Payer: Medicare Other

## 2014-01-05 ENCOUNTER — Ambulatory Visit (INDEPENDENT_AMBULATORY_CARE_PROVIDER_SITE_OTHER): Payer: Medicare Other | Admitting: Podiatry

## 2014-01-05 ENCOUNTER — Encounter: Payer: Self-pay | Admitting: Podiatry

## 2014-01-05 VITALS — BP 159/77 | HR 64 | Resp 18 | Ht 64.0 in | Wt 230.0 lb

## 2014-01-05 DIAGNOSIS — E1149 Type 2 diabetes mellitus with other diabetic neurological complication: Secondary | ICD-10-CM

## 2014-01-05 DIAGNOSIS — M79671 Pain in right foot: Secondary | ICD-10-CM

## 2014-01-05 DIAGNOSIS — M2040 Other hammer toe(s) (acquired), unspecified foot: Secondary | ICD-10-CM

## 2014-01-05 DIAGNOSIS — M79672 Pain in left foot: Principal | ICD-10-CM

## 2014-01-05 DIAGNOSIS — M79609 Pain in unspecified limb: Secondary | ICD-10-CM

## 2014-01-05 DIAGNOSIS — Q828 Other specified congenital malformations of skin: Secondary | ICD-10-CM

## 2014-01-05 NOTE — Telephone Encounter (Signed)
SCHEDULED PT FOR TODAY @ 2PM

## 2014-01-05 NOTE — Progress Notes (Signed)
   Subjective:    Patient ID: Darlene Boyer, female    DOB: 31-Dec-1940, 73 y.o.   MRN: [id number]  HPI Comments: My left pinky toe has been giving me a problem, left #5 toe corn, it has been hurting for a week . The more i wear shoes on the foot the more it gets irritated   Toe Pain       Review of Systems  HENT:       Sinus problems  Sneezing  Ear pain  Eyes: Positive for redness and itching.  Cardiovascular: Positive for palpitations.  Endocrine: Positive for heat intolerance.       Diabetic   Allergic/Immunologic: Positive for environmental allergies.  All other systems reviewed and are negative.       Objective:   Physical Exam I have reviewed her past mental history medications allergies surgeries and social history. Pulses are remain palpable left lower extremity. Neurologic sensorium is intact. 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        Assessment & Plan:  Assessment: Pain in limb secondary to adductovarus rotated hammertoe deformity fifth left. With overlying reactive hyperkeratosis.  Plan: Debridement reactive hyperkeratosis will followup with her when she is ready for surgery.

## 2014-01-18 ENCOUNTER — Ambulatory Visit (HOSPITAL_COMMUNITY): Payer: Medicare Other

## 2014-01-19 ENCOUNTER — Ambulatory Visit: Payer: Self-pay | Admitting: Podiatry

## 2014-01-21 ENCOUNTER — Ambulatory Visit (HOSPITAL_COMMUNITY): Payer: Medicare Other | Attending: Internal Medicine

## 2014-03-09 ENCOUNTER — Ambulatory Visit (INDEPENDENT_AMBULATORY_CARE_PROVIDER_SITE_OTHER): Payer: Medicare Other | Admitting: Podiatry

## 2014-03-09 VITALS — BP 136/80 | HR 65 | Resp 16

## 2014-03-09 DIAGNOSIS — M2010 Hallux valgus (acquired), unspecified foot: Secondary | ICD-10-CM

## 2014-03-09 DIAGNOSIS — M2040 Other hammer toe(s) (acquired), unspecified foot: Secondary | ICD-10-CM

## 2014-03-09 DIAGNOSIS — E1149 Type 2 diabetes mellitus with other diabetic neurological complication: Secondary | ICD-10-CM

## 2014-03-09 NOTE — Patient Instructions (Signed)
Pre-Operative Instructions  Congratulations, you have decided to take an important step to improving your quality of life.  You can be assured that the doctors of [facility] will be with you every step of the way.  1. Plan to be at the surgery center/hospital at least 1 (one) hour prior to your scheduled time unless otherwise directed by the surgical center/hospital staff.  You must have a responsible adult accompany you, remain during the surgery and drive you home.  Make sure you have directions to the surgical center/hospital and know how to get there on time. 2. For hospital based surgery you will need to obtain a history and physical form from your family physician within 1 month prior to the date of surgery- we will give you a form for you primary physician.  3. We make every effort to accommodate the date you request for surgery.  There are however, times where surgery dates or times have to be moved.  We will contact you as soon as possible if a change in schedule is required.   4. No Aspirin/Ibuprofen for one week before surgery.  If you are on aspirin, any non-steroidal anti-inflammatory medications (Mobic, Aleve, Ibuprofen) you should stop taking it 7 days prior to your surgery.  You make take Tylenol  For pain prior to surgery.  5. Medications- If you are taking daily heart and blood pressure medications, seizure, reflux, allergy, asthma, anxiety, pain or diabetes medications, make sure the surgery center/hospital is aware before the day of surgery so they may notify you which medications to take or avoid the day of surgery. 6. No food or drink after midnight the night before surgery unless directed otherwise by surgical center/hospital staff. 7. No alcoholic beverages 24 hours prior to surgery.  No smoking 24 hours prior to or 24 hours after surgery. 8. Wear loose pants or shorts- loose enough to fit over bandages, boots, and casts. 9. No slip on shoes, sneakers are best. 10. Bring  your boot with you to the surgery center/hospital.  Also bring crutches or a walker if your physician has prescribed it for you.  If you do not have this equipment, it will be provided for you after surgery. 11. If you have not been contracted by the surgery center/hospital by the day before your surgery, call to confirm the date and time of your surgery. 12. Leave-time from work may vary depending on the type of surgery you have.  Appropriate arrangements should be made prior to surgery with your employer. 13. Prescriptions will be provided immediately following surgery by your doctor.  Have these filled as soon as possible after surgery and take the medication as directed. 14. Remove nail polish on the operative foot. 15. Wash the night before surgery.  The night before surgery wash the foot and leg well with the antibacterial soap provided and water paying special attention to beneath the toenails and in between the toes.  Rinse thoroughly with water and dry well with a towel.  Perform this wash unless told not to do so by your physician.  Enclosed: 1 Ice pack (please put in freezer the night before surgery)   1 Hibiclens skin cleaner   Pre-op Instructions  If you have any questions regarding the instructions, do not hesitate to call our office.  [place]: [address] [place], [place] [address] [phone]  [place]: [address] [place], [place] [address] [phone]  [place]: [address]  [place], [place] [address] [phone]  Dr. Richard   Tuchman DPM, Dr. Norman Regal DPM Dr. Richard Sikora DPM, Dr. M. Todd Hyatt DPM, Dr. Kathryn Egerton DPM 

## 2014-03-10 NOTE — Progress Notes (Signed)
She and her husband present today with a chief complaint of painful fifth digit of the left foot. She's also complaining of a painful bunion deformity as well as hammertoes #2 and 3 of the left foot. I have reviewed her past medical history medications and allergies today.  Objective: Pulses are strongly palpable left foot today she has adductovarus rotated hammertoe deformity with overlying reactive hyperkeratosis which is extremely painful fifth digit of the left foot. She has rigid hammertoe deformities #2 and 3 of the left foot. Hallux abductovalgus deformity mild but painful on in range of motion as well as painful on palpation to the medial aspect of the first metatarsophalangeal joint. Radiographic evaluation does demonstrate an increase in the first intermetatarsal angle and hammertoe deformities previously mentioned. Cutaneous evaluation demonstrates supple well hydrated cutis with exception of the reactive hyperkeratosis overlying the fifth digit left foot.  Assessment: Hallux valgus left. Hammertoes #2 and #3 of the left foot. 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  Plan: Discussed etiology pathology conservative or surgical therapies at this point to do to lifestyle limiting changes of her left foot surgery is hereby indicated consisting of a McBride bunion repair with possible Liane Comber. Hammertoe repair with screws #2 and #3 of the left foot. And a derotational arthroplasty fifth digit of the left foot. We went over the consent form today line bylined number by number giving her ample time to ask questions she saw fit regarding these procedures I answered them to the best of my ability in layman's terms. She understood it was amenable to it and signed all 3 pages of the consent form we did also discuss a possible postop complications which may include but are not limited to postop pain bleeding swelling infection need for further surgery delay in healing due to her diabetes  etc. I will followup with her in the near future for surgical intervention.

## 2014-03-19 DIAGNOSIS — M2010 Hallux valgus (acquired), unspecified foot: Secondary | ICD-10-CM

## 2014-03-19 DIAGNOSIS — M6240 Contracture of muscle, unspecified site: Secondary | ICD-10-CM

## 2014-03-19 DIAGNOSIS — M2040 Other hammer toe(s) (acquired), unspecified foot: Secondary | ICD-10-CM

## 2014-03-25 ENCOUNTER — Ambulatory Visit (INDEPENDENT_AMBULATORY_CARE_PROVIDER_SITE_OTHER): Payer: Medicare Other | Admitting: Podiatry

## 2014-03-25 ENCOUNTER — Ambulatory Visit (INDEPENDENT_AMBULATORY_CARE_PROVIDER_SITE_OTHER): Payer: Medicare Other

## 2014-03-25 ENCOUNTER — Encounter: Payer: Medicare Other | Admitting: Podiatry

## 2014-03-25 ENCOUNTER — Encounter: Payer: Self-pay | Admitting: Podiatry

## 2014-03-25 VITALS — BP 130/65 | HR 71 | Resp 16

## 2014-03-25 DIAGNOSIS — M2040 Other hammer toe(s) (acquired), unspecified foot: Secondary | ICD-10-CM

## 2014-03-25 DIAGNOSIS — M2010 Hallux valgus (acquired), unspecified foot: Secondary | ICD-10-CM

## 2014-03-25 DIAGNOSIS — R609 Edema, unspecified: Secondary | ICD-10-CM

## 2014-03-25 NOTE — Progress Notes (Deleted)
Subjective:      Patient ID: Darlene Boyer is a 73 y.o. female.  Chief Complaint: HPI {Common ambulatory SmartLinks:19316} ROS    Objective:    Physical Exam  Lab Review:  {Recent FWYO:37858::"IFO applicable"}    Assessment:     HAV (hallux abducto valgus) - Plan: DG Foot Complete Left  [redacted] weeks gestation of pregnancy  Other hammer toe (acquired)  Edema   Plan:     ***

## 2014-03-26 NOTE — Progress Notes (Signed)
Subjective:     Patient ID: Darlene Boyer, female   DOB: 1941/08/14, 73 y.o.   MRN: [id number]  HPI patient presents stating I am doing well with my surgery left foot   Review of Systems     Objective:   Physical Exam Neurovascular status intact with negative Homans sign noted and well-healing surgical sites left foot   with wound edges well coapted first MPJ range of motion adequate and second and third toes in good alignment Assessment:     Doing well post operative foot surgery 6 days performed by Dr. Milinda Pointer left foot    Plan:     X-rays reviewed with patient and sterile dressing reapplied with instructions on continued immobilization. Reappoint for suture removal in the next 2 weeks earlier if any issues should occur

## 2014-04-08 ENCOUNTER — Encounter: Payer: Self-pay | Admitting: Podiatry

## 2014-04-08 ENCOUNTER — Ambulatory Visit (INDEPENDENT_AMBULATORY_CARE_PROVIDER_SITE_OTHER): Payer: Medicare Other | Admitting: Podiatry

## 2014-04-08 VITALS — BP 112/57 | HR 79 | Resp 16

## 2014-04-08 DIAGNOSIS — Z9889 Other specified postprocedural states: Secondary | ICD-10-CM

## 2014-04-08 NOTE — Progress Notes (Signed)
She presents today 2 weeks status post Austin bunion repair left hammertoe repair #2 and #3 and #5 of the left foot. She states that I am ready to get out of his boot. My foot really doesn't hurt.  Objective: Vital signs are stable she is alert and oriented x3 pulses are strongly palpable left foot. Left foot demonstrates rectus hallux straight toes minimal edema no erythema cellulitis drainage or odor. She has good range of motion of the toes and I encouraged that today. Radiographically she is healing quite nicely. Sutures were removed today.  Assessment: Well-healing surgical foot left.  Plan: I placed her in a compression anklet and a Darco shoe will followup with her in 2 weeks for another set of x-rays. Hopefully at that point she will be able to get into a tennis shoe.

## 2014-04-13 ENCOUNTER — Ambulatory Visit: Payer: Medicare Other | Admitting: Podiatry

## 2014-04-23 ENCOUNTER — Ambulatory Visit (INDEPENDENT_AMBULATORY_CARE_PROVIDER_SITE_OTHER): Payer: Medicare Other | Admitting: *Deleted

## 2014-04-23 ENCOUNTER — Encounter: Payer: Medicare Other | Admitting: Podiatrist

## 2014-04-23 ENCOUNTER — Ambulatory Visit (INDEPENDENT_AMBULATORY_CARE_PROVIDER_SITE_OTHER): Payer: Medicare Other

## 2014-04-23 DIAGNOSIS — Z9889 Other specified postprocedural states: Secondary | ICD-10-CM

## 2014-04-23 MED ORDER — HYDROCODONE-ACETAMINOPHEN 10-325 MG PO TABS: 1.0000 | ORAL_TABLET | Freq: Three times a day (TID) | ORAL | Status: DC | PRN

## 2014-04-23 NOTE — Progress Notes (Signed)
   Subjective:    Patient ID: Darlene Boyer, female    DOB: 10-Apr-1941, 73 y.o.   MRN: [id number]  HPI TOOK  LT FOOT 3 VIEW X RAY TO SEE IF THE PT IS READY TO WEAR SHOES.    Review of Systems     Objective:   Physical Exam        Assessment & Plan:

## 2014-04-23 NOTE — Patient Instructions (Signed)
Continue with surgical shoe and compression stocking over the weekend. Will call on Monday to let you know if okay to get back into tennis shoe.

## 2014-04-28 ENCOUNTER — Telehealth: Payer: Self-pay | Admitting: *Deleted

## 2014-04-28 NOTE — Telephone Encounter (Signed)
Calling in reference to x-rays.  I was supposed to been called about them on Monday or do I wait until I see Dr. Milinda Pointer on the 16th of this month?  I'd appreciate some input on this because I really don't know what to do other than soaking the foot.  Please get back with me.  I returned her call.  She stated that the nurse had told her that Dr. Milinda Pointer would review my x-rays and let me know how everything is progressing.  I informed her he wants her to continue to wear the surgical shoe and will take x-rays again at next visit on the 16th.  She asked if there's anything wrong.  I told her no, he just wants to make sure it's healing properly, it's a slow process.  She stated oh okay.  I asked if she has been soaking.  She said yes, Dr. Milinda Pointer told me to, I been soaking once or twice a day.  She asked if it was okay to put lotion on the foot, the nurse told me no but Dr. Milinda Pointer said yes.  I told her to do what Dr. Milinda Pointer advised her to do.

## 2014-05-11 ENCOUNTER — Encounter: Payer: Self-pay | Admitting: Podiatry

## 2014-05-11 ENCOUNTER — Ambulatory Visit (INDEPENDENT_AMBULATORY_CARE_PROVIDER_SITE_OTHER): Payer: Medicare Other

## 2014-05-11 ENCOUNTER — Ambulatory Visit (INDEPENDENT_AMBULATORY_CARE_PROVIDER_SITE_OTHER): Payer: Medicare Other | Admitting: Podiatry

## 2014-05-11 VITALS — BP 132/64 | HR 63 | Resp 16

## 2014-05-11 DIAGNOSIS — M2010 Hallux valgus (acquired), unspecified foot: Secondary | ICD-10-CM

## 2014-05-11 NOTE — Progress Notes (Signed)
She presents today approximately 2 months status post McBride bunion repair left hammertoe repair second digit with screw left hammertoe repair third digit with screw left and hammertoe repair fifth digit left, arthroplasty. She states that she doing quite well little bit stiff in the mornings but otherwise is doing well she presents today in her Darco shoe.  Objective: Vital signs are stable she is alert and oriented x3 pulses are strongly palpable left foot. Minimal edema overlying the first metatarsophalangeal joint a fantastic range of motion without pain. She has good range of motion of the metatarsophalangeal joints to toes #2 #3 and #of the left foot. Radiographic evaluation demonstrates well-healing surgical foot with near complete arthrodesis to toes #2 and #3 with screws left foot.  Assessment: Well-healing surgical foot left.  Plan: Discontinue the use of the Darco shoe to back into a regular tennis shoe or sandal. I suggested no bare feet for at least another month she will watch for signs and symptoms of infection or she has any trauma she will notify us immediately otherwise I will followup with her as needed.

## 2014-05-11 NOTE — Progress Notes (Signed)
Dr Milinda Pointer performed a left Keller/Mcbride Bunionectomy. Percocet 10/325mg  #40 2 tabs q6hrs prn pain. Phenergan 25mg  #30 prn nausea. Keflex 500mg  1 tab TID #30

## 2014-06-29 ENCOUNTER — Telehealth: Payer: Self-pay | Admitting: *Deleted

## 2014-07-01 NOTE — Telephone Encounter (Signed)
Calling in reference to getting measured for Diabetic shoes.  You said my last visit with Dr. Jeanann Lewandowsky was 10/15/2013.  That is incorrect, I was there 2 weeks ago.  Please get in touch with me so we can get this straightened out.

## 2015-03-23 DIAGNOSIS — M2550 Pain in unspecified joint: Secondary | ICD-10-CM | POA: Diagnosis not present

## 2015-03-23 DIAGNOSIS — E119 Type 2 diabetes mellitus without complications: Secondary | ICD-10-CM | POA: Diagnosis not present

## 2015-03-23 DIAGNOSIS — I10 Essential (primary) hypertension: Secondary | ICD-10-CM | POA: Diagnosis not present

## 2015-03-23 DIAGNOSIS — E039 Hypothyroidism, unspecified: Secondary | ICD-10-CM | POA: Diagnosis not present

## 2015-04-27 DIAGNOSIS — E119 Type 2 diabetes mellitus without complications: Secondary | ICD-10-CM | POA: Diagnosis not present

## 2015-04-27 DIAGNOSIS — E039 Hypothyroidism, unspecified: Secondary | ICD-10-CM | POA: Diagnosis not present

## 2015-04-27 DIAGNOSIS — I10 Essential (primary) hypertension: Secondary | ICD-10-CM | POA: Diagnosis not present

## 2015-05-12 ENCOUNTER — Ambulatory Visit (HOSPITAL_COMMUNITY)
Admission: RE | Admit: 2015-05-12 | Discharge: 2015-05-12 | Disposition: A | Discharge status: Home / Self Care | Payer: Medicare Other | Source: Ambulatory Visit | Attending: Internal Medicine | Admitting: Internal Medicine

## 2015-05-12 ENCOUNTER — Other Ambulatory Visit: Payer: Self-pay | Admitting: Internal Medicine

## 2015-05-12 DIAGNOSIS — R05 Cough: Secondary | ICD-10-CM | POA: Diagnosis not present

## 2015-05-12 DIAGNOSIS — R0602 Shortness of breath: Secondary | ICD-10-CM | POA: Diagnosis not present

## 2015-05-12 DIAGNOSIS — I10 Essential (primary) hypertension: Secondary | ICD-10-CM | POA: Diagnosis not present

## 2015-05-12 DIAGNOSIS — R062 Wheezing: Secondary | ICD-10-CM

## 2015-05-12 DIAGNOSIS — E119 Type 2 diabetes mellitus without complications: Secondary | ICD-10-CM | POA: Diagnosis not present

## 2015-05-12 DIAGNOSIS — Z9049 Acquired absence of other specified parts of digestive tract: Secondary | ICD-10-CM | POA: Diagnosis not present

## 2015-08-29 DIAGNOSIS — E119 Type 2 diabetes mellitus without complications: Secondary | ICD-10-CM | POA: Diagnosis not present

## 2015-08-29 DIAGNOSIS — I10 Essential (primary) hypertension: Secondary | ICD-10-CM | POA: Diagnosis not present

## 2015-08-29 DIAGNOSIS — E039 Hypothyroidism, unspecified: Secondary | ICD-10-CM | POA: Diagnosis not present

## 2015-08-29 DIAGNOSIS — R252 Cramp and spasm: Secondary | ICD-10-CM | POA: Diagnosis not present

## 2015-12-01 DIAGNOSIS — H04123 Dry eye syndrome of bilateral lacrimal glands: Secondary | ICD-10-CM | POA: Diagnosis not present

## 2015-12-01 DIAGNOSIS — H10413 Chronic giant papillary conjunctivitis, bilateral: Secondary | ICD-10-CM | POA: Diagnosis not present

## 2015-12-01 DIAGNOSIS — H2511 Age-related nuclear cataract, right eye: Secondary | ICD-10-CM | POA: Diagnosis not present

## 2015-12-01 DIAGNOSIS — H40053 Ocular hypertension, bilateral: Secondary | ICD-10-CM | POA: Diagnosis not present

## 2015-12-01 DIAGNOSIS — E119 Type 2 diabetes mellitus without complications: Secondary | ICD-10-CM | POA: Diagnosis not present

## 2015-12-03 DIAGNOSIS — H2511 Age-related nuclear cataract, right eye: Secondary | ICD-10-CM | POA: Diagnosis not present

## 2015-12-12 DIAGNOSIS — H2511 Age-related nuclear cataract, right eye: Secondary | ICD-10-CM | POA: Diagnosis not present

## 2016-01-19 DIAGNOSIS — Z961 Presence of intraocular lens: Secondary | ICD-10-CM | POA: Diagnosis not present

## 2016-01-23 DIAGNOSIS — I10 Essential (primary) hypertension: Secondary | ICD-10-CM | POA: Diagnosis not present

## 2016-01-23 DIAGNOSIS — E119 Type 2 diabetes mellitus without complications: Secondary | ICD-10-CM | POA: Diagnosis not present

## 2016-01-23 DIAGNOSIS — E109 Type 1 diabetes mellitus without complications: Secondary | ICD-10-CM | POA: Diagnosis not present

## 2016-01-23 DIAGNOSIS — E039 Hypothyroidism, unspecified: Secondary | ICD-10-CM | POA: Diagnosis not present

## 2016-02-01 DIAGNOSIS — E039 Hypothyroidism, unspecified: Secondary | ICD-10-CM | POA: Diagnosis not present

## 2016-02-01 DIAGNOSIS — I10 Essential (primary) hypertension: Secondary | ICD-10-CM | POA: Diagnosis not present

## 2016-02-01 DIAGNOSIS — E119 Type 2 diabetes mellitus without complications: Secondary | ICD-10-CM | POA: Diagnosis not present

## 2016-02-01 DIAGNOSIS — R000 Tachycardia, unspecified: Secondary | ICD-10-CM | POA: Diagnosis not present

## 2016-02-08 DIAGNOSIS — I10 Essential (primary) hypertension: Secondary | ICD-10-CM | POA: Diagnosis not present

## 2016-02-08 DIAGNOSIS — E119 Type 2 diabetes mellitus without complications: Secondary | ICD-10-CM | POA: Diagnosis not present

## 2016-02-08 DIAGNOSIS — E039 Hypothyroidism, unspecified: Secondary | ICD-10-CM | POA: Diagnosis not present

## 2016-02-15 ENCOUNTER — Telehealth: Payer: Self-pay | Admitting: Cardiology

## 2016-02-15 NOTE — Telephone Encounter (Signed)
Received records from Dr Jeanann Lewandowsky for appointment on 02/23/16 with Dr Percival Spanish.  Records given to [facility] (medical records) for Dr Hochrein's schedule on 02/23/16. lp

## 2016-02-21 DIAGNOSIS — I10 Essential (primary) hypertension: Secondary | ICD-10-CM | POA: Insufficient documentation

## 2016-02-21 DIAGNOSIS — E119 Type 2 diabetes mellitus without complications: Secondary | ICD-10-CM | POA: Insufficient documentation

## 2016-02-21 DIAGNOSIS — E079 Disorder of thyroid, unspecified: Secondary | ICD-10-CM | POA: Insufficient documentation

## 2016-02-23 ENCOUNTER — Ambulatory Visit (INDEPENDENT_AMBULATORY_CARE_PROVIDER_SITE_OTHER): Payer: Medicare Other | Admitting: Cardiology

## 2016-02-23 ENCOUNTER — Encounter: Payer: Self-pay | Admitting: Cardiology

## 2016-02-23 VITALS — BP 124/78 | HR 90 | Ht 64.0 in | Wt 254.0 lb

## 2016-02-23 DIAGNOSIS — I4891 Unspecified atrial fibrillation: Secondary | ICD-10-CM

## 2016-02-23 MED ORDER — RIVAROXABAN 20 MG PO TABS: 20.0000 mg | ORAL_TABLET | Freq: Every day | ORAL | Status: DC

## 2016-02-23 NOTE — Progress Notes (Signed)
Cardiology Office Note   Date:  02/23/2016   ID:  Darlene Boyer, DOB [birth year], MRN [id number]  PCP:  Foye Spurling, MD  Cardiologist:   Minus Breeding, MD   Chief Complaint  Patient presents with  . Atrial Fibrillation      History of Present Illness: Darlene Boyer is a 75 y.o. female who presents for evaluation of atrial fibrillation. She has no past cardiac history. The other day during routine exam she was found to be in fibrillation on EKG. She does notice that her heart skips sometimes when she lies in a certain position at night. It worries her but it actually doesn't cause her to have any increased shortness of breath, chest discomfort, presyncope or syncope. She does have fatigue but she also has a lot of stress with her brother dying, husband being sick and son having diabetes recently. She's also a Theme park manager. She is active in her daily living and does her chores. She does not notice her heart rate is really increasing with this. She denies any chest pressure, neck or arm discomfort. She's had no PND or orthopnea.   Past Medical History  Diagnosis Date  . Hypertension   . Diabetes mellitus without complication ([place])   . Thyroid disease   . Atrial fibrillation [facility])     Past Surgical History  Procedure Laterality Date  . Ankle fracture surgery Right   . Tubal ligation    . Abdominal hysterectomy    . Gallbladder surgery    . Eye surgery      Lens implants     Current Outpatient Prescriptions  Medication Sig Dispense Refill  . levothyroxine (SYNTHROID, LEVOTHROID) 100 MCG tablet Take 100 mcg by mouth daily before breakfast.    . losartan (COZAAR) 100 MG tablet Take 100 mg by mouth daily.    . metFORMIN (GLUCOPHAGE) 1000 MG tablet Take 1,000 mg by mouth 2 (two) times daily with a meal.    . metoprolol succinate (TOPROL-XL) 50 MG 24 hr tablet Take 50 mg by mouth 2 (two) times daily. Take with or immediately following a meal. Take 1 tab by mouth twice a day  for 1 week then take 1 tab by mouth daily     No current facility-administered medications for this visit.    Allergies:   Review of patient's allergies indicates no known allergies.    Social History:  The patient  reports that she has never smoked. She has never used smokeless tobacco. She reports that she does not drink alcohol or use illicit drugs.   Family History:  The patient's family history includes Emphysema in her father; Lung cancer in her mother.    ROS:  Please see the history of present illness.   Otherwise, review of systems are positive for none.   All other systems are reviewed and negative.    PHYSICAL EXAM: VS:  BP 124/78 mmHg  Pulse 90  Ht 5\' 4"  (1.626 m)  Wt 254 lb (115.214 kg)  BMI 43.58 kg/m2 , BMI Body mass index is 43.58 kg/(m^2). GENERAL:  Well appearing HEENT:  Pupils equal round and reactive, fundi not visualized, oral mucosa unremarkable NECK:  No jugular venous distention, waveform within normal limits, carotid upstroke brisk and symmetric, no bruits, no thyromegaly LYMPHATICS:  No cervical, inguinal adenopathy LUNGS:  Clear to auscultation bilaterally BACK:  No CVA tenderness CHEST:  Unremarkable HEART:  PMI not displaced or sustained,S1 and S2 within normal limits, no S3, no S4,  no clicks, no rubs, no  murmurs ABD:  Flat, positive bowel sounds normal in frequency in pitch, no bruits, no rebound, no guarding, no midline pulsatile mass, no hepatomegaly, no splenomegaly EXT:  2 plus pulses throughout, no edema, no cyanosis no clubbing SKIN:  No rashes no nodules NEURO:  Cranial nerves II through XII grossly intact, motor grossly intact throughout PSYCH:  Cognitively intact, oriented to person place and time    EKG:  EKG is ordered today. The ekg ordered today demonstrates atrial flutter, rate 90, variable conduction, axis within normal limits, intervals within normal limits, nonspecific T-wave flattening.   Recent Labs: No results found for  requested labs within last 365 days.    Lipid Panel No results found for: CHOL, TRIG, HDL, CHOLHDL, VLDL, LDLCALC, LDLDIRECT    Wt Readings from Last 3 Encounters:  02/23/16 254 lb (115.214 kg)  01/05/14 230 lb (104.327 kg)      Other studies Reviewed: Additional studies/ records that were reviewed today include: office records. Review of the above records demonstrates:  Please see elsewhere in the note.     ASSESSMENT AND PLAN:  ATRIAL FIB/FLUTTER:  This is a new finding. I discussed this at length with the patient.   [name] has a CHA2DS2 - VASc score of 4 with a risk of stroke of 4%.  She has no contraindication anticoagulation. I will put her on Xarelto. We discussed the risks benefits at length. She will get an echocardiogram. I will apply a 48 hour Holter monitor to see if this is paroxysmal and to judge rate control.   Of note her electrolytes and renal function were normal. She has her thyroid followed. He has a very borderline anemia.  HTN:  The blood pressure is at target. No change in medications is indicated. We will continue with therapeutic lifestyle changes (TLC).   Current medicines are reviewed at length with the patient today.  The patient does not have concerns regarding medicines.  The following changes have been made:  no change  Labs/ tests ordered today include:   Orders Placed This Encounter  Procedures  . EKG 12-Lead     Disposition:   FU with me in six months.     Signed, Minus Breeding, MD  02/23/2016 3:20 PM    [facility]

## 2016-02-23 NOTE — Patient Instructions (Addendum)
Your physician recommends that you schedule a follow-up appointment in: [place] has requested that you have an echocardiogram. Echocardiography is a painless test that uses sound waves to create images of your heart. It provides your doctor with information about the size and shape of your heart and how well your heart's chambers and valves are working. This procedure takes approximately one hour. There are no restrictions for this procedure.  Your physician has recommended that you wear a holter monitor for 48 hour this monitor with be put on at our [facility]. Holter monitors are medical devices that record the heart's electrical activity. Doctors most often use these monitors to diagnose arrhythmias. Arrhythmias are problems with the speed or rhythm of the heartbeat. The monitor is a small, portable device. You can wear one while you do your normal daily activities. This is usually used to diagnose what is causing palpitations/syncope (passing out).  Your physician has recommended you make the following change in your medication: START Xarelto 20 mg daily

## 2016-02-28 ENCOUNTER — Telehealth: Payer: Self-pay

## 2016-02-28 NOTE — Telephone Encounter (Signed)
Prior auth for Xarelto 20mg submitted to [facility]. 

## 2016-03-01 ENCOUNTER — Telehealth: Payer: Self-pay

## 2016-03-01 NOTE — Telephone Encounter (Signed)
Xarelto approved through 11/25/2016. [place] [id number].

## 2016-03-14 ENCOUNTER — Ambulatory Visit (HOSPITAL_COMMUNITY): Payer: Medicare Other | Attending: Cardiovascular Disease

## 2016-03-14 ENCOUNTER — Ambulatory Visit (INDEPENDENT_AMBULATORY_CARE_PROVIDER_SITE_OTHER): Payer: Medicare Other

## 2016-03-14 ENCOUNTER — Other Ambulatory Visit: Payer: Self-pay

## 2016-03-14 DIAGNOSIS — I4891 Unspecified atrial fibrillation: Secondary | ICD-10-CM | POA: Diagnosis not present

## 2016-03-14 DIAGNOSIS — I119 Hypertensive heart disease without heart failure: Secondary | ICD-10-CM | POA: Insufficient documentation

## 2016-03-14 DIAGNOSIS — E119 Type 2 diabetes mellitus without complications: Secondary | ICD-10-CM | POA: Insufficient documentation

## 2016-03-14 DIAGNOSIS — I340 Nonrheumatic mitral (valve) insufficiency: Secondary | ICD-10-CM | POA: Diagnosis not present

## 2016-03-21 ENCOUNTER — Telehealth: Payer: Self-pay | Admitting: Cardiology

## 2016-03-21 NOTE — Telephone Encounter (Signed)
FORWARD TO PHARMACIST

## 2016-03-21 NOTE — Telephone Encounter (Signed)
SPOKE  TO PATIENT INFORMATION GIVEN SHE VERBALIZED UNDERSTANDING. PLAIN MUCINEX- IS OKAY TO USE

## 2016-03-21 NOTE — Telephone Encounter (Signed)
[place]  Pt was told to buy mucinex byt her PCP for her recent cold. Pt wanted to discuss possible interaction w/ xarelto, before taking mucinex. Please call back and discuss.

## 2016-03-21 NOTE — Telephone Encounter (Signed)
mucinex is fine with Xarelto.

## 2016-04-04 DIAGNOSIS — I4891 Unspecified atrial fibrillation: Secondary | ICD-10-CM | POA: Diagnosis not present

## 2016-04-04 DIAGNOSIS — J40 Bronchitis, not specified as acute or chronic: Secondary | ICD-10-CM | POA: Diagnosis not present

## 2016-04-04 DIAGNOSIS — I10 Essential (primary) hypertension: Secondary | ICD-10-CM | POA: Diagnosis not present

## 2016-04-04 DIAGNOSIS — E039 Hypothyroidism, unspecified: Secondary | ICD-10-CM | POA: Diagnosis not present

## 2016-04-04 DIAGNOSIS — E119 Type 2 diabetes mellitus without complications: Secondary | ICD-10-CM | POA: Diagnosis not present

## 2016-04-08 NOTE — Progress Notes (Signed)
Cardiology Office Note   Date:  04/09/2016   ID:  Darlene Boyer, DOB 12-19-1940, MRN [id number]  PCP:  Darlene Spurling, MD  Cardiologist:   Darlene Breeding, MD   No chief complaint on file.     History of Present Illness: Darlene Boyer is a 75 y.o. female who presents for evaluation of atrial fibrillation. She was noted to be in fibrillation at previous visit.  I put a monitor on her.  This demonstrated atrial flutter with variable conduction with response for the most part and ventricular ectopy at times in a bigeminal pattern. Bradycardic events are most pronounced during sleeping. She was started on anticoagulation at the last visit. She has felt well. She rarely feels palpitations. This happens sometimes when she goes to the bathroom. She might have when she is hot. She otherwise denies any cardiovascular symptoms. She's actually lost 14 pounds through diet and exercise. The patient denies any new symptoms such as chest discomfort, neck or arm discomfort. There has been no new shortness of breath, PND or orthopnea.    Past Medical History  Diagnosis Date  . Hypertension   . Diabetes mellitus without complication ([place])   . Thyroid disease   . Atrial fibrillation [facility])     Past Surgical History  Procedure Laterality Date  . Ankle fracture surgery Right   . Tubal ligation    . Abdominal hysterectomy    . Gallbladder surgery    . Eye surgery      Lens implants     Current Outpatient Prescriptions  Medication Sig Dispense Refill  . levothyroxine (SYNTHROID, LEVOTHROID) 100 MCG tablet Take 100 mcg by mouth daily before breakfast.    . metFORMIN (GLUCOPHAGE) 1000 MG tablet Take 1,000 mg by mouth 2 (two) times daily with a meal.    . metoprolol succinate (TOPROL-XL) 100 MG 24 hr tablet Take 100 mg by mouth daily.  5  . rivaroxaban (XARELTO) 20 MG TABS tablet Take 1 tablet (20 mg total) by mouth daily with supper. 30 tablet 9  . SYMBICORT 160-4.5 MCG/ACT inhaler Inhale  1 puff into the lungs 2 (two) times daily.  2   No current facility-administered medications for this visit.    Allergies:   Review of patient's allergies indicates no known allergies.   ROS:  Please see the history of present illness.   Otherwise, review of systems are positive for none.   All other systems are reviewed and negative.    PHYSICAL EXAM: VS:  BP 156/85 mmHg  Pulse 68  Ht 5\' 3"  (1.6 m)  Wt 240 lb 6.4 oz (109.045 kg)  BMI 42.60 kg/m2 , BMI Body mass index is 42.6 kg/(m^2). GENERAL:  Well appearing NECK:  No jugular venous distention, waveform within normal limits, carotid upstroke brisk and symmetric, no bruits, no thyromegaly LUNGS:  Clear to auscultation bilaterally BACK:  No CVA tenderness CHEST:  Unremarkable HEART:  PMI not displaced or sustained,S1 and S2 within normal limits, no S3, no S4, no clicks, no rubs, no  murmurs ABD:  Flat, positive bowel sounds normal in frequency in pitch, no bruits, no rebound, no guarding, no midline pulsatile mass, no hepatomegaly, no splenomegaly EXT:  2 plus pulses throughout, no edema, no cyanosis no clubbing   EKG:  EKG is not ordered today.   Recent Labs: No results found for requested labs within last 365 days.    Lipid Panel No results found for: CHOL, TRIG, HDL, CHOLHDL, VLDL, LDLCALC, LDLDIRECT  Wt Readings from Last 3 Encounters:  04/09/16 240 lb 6.4 oz (109.045 kg)  02/23/16 254 lb (115.214 kg)  01/05/14 230 lb (104.327 kg)      Other studies Reviewed: Additional studies/ records that were reviewed today include: office records. Review of the above records demonstrates:  Please see elsewhere in the note.     ASSESSMENT AND PLAN:  ATRIAL FIB/FLUTTER:   Darlene Boyer has a CHA2DS2 - VASc score of 4 with a risk of stroke of 4%.  She tolerates rate control and anticoagulation. No change in therapy is indicated.   HTN:  The blood pressure is elevated today. However, she says at home is always under [id number]  systolic. Therefore, no change in therapy is indicated.    Current medicines are reviewed at length with the patient today.  The patient does not have concerns regarding medicines.  The following changes have been made:  no change  Labs/ tests ordered today include:   No orders of the defined types were placed in this encounter.     Disposition:   FU with me in 12 months.     Signed, Darlene Breeding, MD  04/09/2016 11:38 AM    [facility]

## 2016-04-09 ENCOUNTER — Ambulatory Visit (INDEPENDENT_AMBULATORY_CARE_PROVIDER_SITE_OTHER): Payer: Medicare Other | Admitting: Cardiology

## 2016-04-09 ENCOUNTER — Encounter: Payer: Self-pay | Admitting: Cardiology

## 2016-04-09 VITALS — BP 156/85 | HR 68 | Ht 63.0 in | Wt 240.4 lb

## 2016-04-09 DIAGNOSIS — I4819 Other persistent atrial fibrillation: Secondary | ICD-10-CM

## 2016-04-09 DIAGNOSIS — I483 Typical atrial flutter: Secondary | ICD-10-CM | POA: Diagnosis not present

## 2016-04-09 DIAGNOSIS — I481 Persistent atrial fibrillation: Secondary | ICD-10-CM

## 2016-04-09 DIAGNOSIS — I4892 Unspecified atrial flutter: Secondary | ICD-10-CM | POA: Insufficient documentation

## 2016-04-09 NOTE — Patient Instructions (Signed)
Your physician wants you to follow-up in: 1 Year. You will receive a reminder letter in the mail two months in advance. If you don't receive a letter, please call our office to schedule the follow-up appointment.  

## 2016-04-25 ENCOUNTER — Ambulatory Visit: Payer: Self-pay | Admitting: Allergy and Immunology

## 2016-04-27 ENCOUNTER — Ambulatory Visit (HOSPITAL_COMMUNITY)
Admission: EM | Admit: 2016-04-27 | Discharge: 2016-04-27 | Disposition: A | Discharge status: Home / Self Care | Payer: Medicare Other | Attending: Emergency Medicine | Admitting: Emergency Medicine

## 2016-04-27 ENCOUNTER — Encounter (HOSPITAL_COMMUNITY): Payer: Self-pay | Admitting: *Deleted

## 2016-04-27 DIAGNOSIS — H6503 Acute serous otitis media, bilateral: Secondary | ICD-10-CM | POA: Diagnosis not present

## 2016-04-27 MED ORDER — LORATADINE 10 MG PO TABS
10.0000 mg | ORAL_TABLET | Freq: Every day | ORAL | Status: DC
Start: 2016-04-27 — End: 2017-04-11

## 2016-04-27 NOTE — ED Notes (Signed)
Pt  Reports   Symptoms  Of  Dizzy       As  Well   As  Bilateral earache    With pain l side  Of  Her  Face     Symptoms  X  1  Week    Pt  Has  A  History    Of     Allergies         pt ambulated  To  The  Exam room  Is  Sitting upright  And  Is  Speaking  In  Complete  sentances

## 2016-04-27 NOTE — ED Provider Notes (Signed)
CSN: [id number]     Arrival date & time 04/27/16  1606 History   First MD Initiated Contact with Patient 04/27/16 1638     Chief Complaint  Patient presents with  . Dizziness   (Consider location/radiation/quality/duration/timing/severity/associated sxs/prior Treatment) HPI History obtained from patient:  Pt presents with the cc of:  Left ear pain Duration of symptoms: Several days Treatment prior to arrival: Home treatment without resolution of symptoms Context: Allergies may have been acting up and since she has been taken Xarelto is a bit worried about taking any other medication. Other symptoms include: Dizziness Pain score: 2 FAMILY HISTORY: Cancer-mother    Past Medical History  Diagnosis Date  . Hypertension   . Diabetes mellitus without complication ([place])   . Thyroid disease   . Atrial fibrillation [facility])    Past Surgical History  Procedure Laterality Date  . Ankle fracture surgery Right   . Tubal ligation    . Abdominal hysterectomy    . Gallbladder surgery    . Eye surgery      Lens implants   Family History  Problem Relation Age of Onset  . Emphysema Father   . Lung cancer Mother    Social History  Substance Use Topics  . Smoking status: Never Smoker   . Smokeless tobacco: Current User    Types: Snuff     Comment: "every now and then"  . Alcohol Use: No   OB History    No data available     Review of Systems  Denies: HEADACHE, NAUSEA, ABDOMINAL PAIN, CHEST PAIN, CONGESTION, DYSURIA, SHORTNESS OF BREATH  Allergies  Review of patient's allergies indicates no known allergies.  Home Medications   Prior to Admission medications   Medication Sig Start Date End Date Taking? Authorizing Provider  levothyroxine (SYNTHROID, LEVOTHROID) 100 MCG tablet Take 100 mcg by mouth daily before breakfast.    Historical Provider, MD  loratadine (CLARITIN) 10 MG tablet Take 1 tablet (10 mg total) by mouth daily. 04/27/16   Konrad Felix, PA  metFORMIN (GLUCOPHAGE)  1000 MG tablet Take 1,000 mg by mouth 2 (two) times daily with a meal.    Historical Provider, MD  metoprolol succinate (TOPROL-XL) 100 MG 24 hr tablet Take 100 mg by mouth daily. 04/04/16   Historical Provider, MD  rivaroxaban (XARELTO) 20 MG TABS tablet Take 1 tablet (20 mg total) by mouth daily with supper. 02/23/16   Minus Breeding, MD  SYMBICORT 160-4.5 MCG/ACT inhaler Inhale 1 puff into the lungs 2 (two) times daily. 04/04/16   Historical Provider, MD   Meds Ordered and Administered this Visit  Medications - No data to display  BP 152/73 mmHg  Pulse 74  Temp(Src) 98.1 F (36.7 C) (Oral)  Resp 18  SpO2 97% No data found.   Physical Exam NURSES NOTES AND VITAL SIGNS REVIEWED. CONSTITUTIONAL: Well developed, well nourished, no acute distress HEENT: normocephalic, atraumatic, effusion both ears. Left >R EYES: Conjunctiva normal, EOM-I, no nystagmus NECK:normal ROM, supple, no adenopathy PULMONARY:No respiratory distress, normal effort, no wheezing ABDOMINAL: Soft, ND, NT BS+, No CVAT MUSCULOSKELETAL: Normal ROM of all extremities,  SKIN: warm and dry without rash PSYCHIATRIC: Mood and affect, behavior are normal  ED Course  Procedures (including critical care time)  Labs Review Labs Reviewed - No data to display  Imaging Review No results found.   Visual Acuity Review  Right Eye Distance:   Left Eye Distance:   Bilateral Distance:    Right Eye Near:  Left Eye Near:    Bilateral Near:      rx  Claritin  No interaction with tylenol and current medications.    MDM   1. Bilateral acute serous otitis media, recurrence not specified     Patient is reassured that there are no issues that require transfer to higher level of care at this time or additional tests. Patient is advised to continue home symptomatic treatment. Patient is advised that if there are new or worsening symptoms to attend the emergency department, contact primary care provider, or return to  [facility]. Instructions of care provided discharged home in stable condition.    THIS NOTE WAS GENERATED USING A VOICE RECOGNITION SOFTWARE PROGRAM. ALL REASONABLE EFFORTS  WERE MADE TO PROOFREAD THIS DOCUMENT FOR ACCURACY.  I have verbally reviewed the discharge instructions with the patient. A printed AVS was given to the patient.  All questions were answered prior to discharge.      Konrad Felix, [place] 04/27/16 [phone]

## 2016-04-27 NOTE — Discharge Instructions (Signed)
Vertigo Vertigo means that you feel like you are moving when you are not. Vertigo can also make you feel like things around you are moving when they are not. This feeling can come and go at any time. Vertigo often goes away on its own. HOME CARE  Avoid making fast movements.  Avoid driving.  Avoid using heavy machinery.  Avoid doing any task or activity that might cause danger to you or other people if you would have a vertigo attack while you are doing it.  Sit down right away if you feel dizzy or have trouble with your balance.  Take over-the-counter and prescription medicines only as told by your doctor.  Follow instructions from your doctor about which positions or movements you should avoid.  Drink enough fluid to keep your pee (urine) clear or pale yellow.  Keep all follow-up visits as told by your doctor. This is important. GET HELP IF:  Medicine does not help your vertigo.  You have a fever.  Your problems get worse or you have new symptoms.  Your family or friends see changes in your behavior.  You feel sick to your stomach (nauseous) or you throw up (vomit).  You have a "pins and needles" feeling or you are numb in part of your body. GET HELP RIGHT AWAY IF:  You have trouble moving or talking.  You are always dizzy.  You pass out (faint).  You get very bad headaches.  You feel weak or have trouble using your hands, arms, or legs.  You have changes in your hearing.  You have changes in your seeing (vision).  You get a stiff neck.  Bright light starts to bother you.   This information is not intended to replace advice given to you by your health care provider. Make sure you discuss any questions you have with your health care provider.   Document Released: 08/21/2008 Document Revised: 08/03/2015 Document Reviewed: 03/07/2015 Elsevier Interactive Patient Education 2016 Elsevier Inc. Serous Otitis Media Serous otitis media is fluid in the middle ear  space. This space contains the bones for hearing and air. Air in the middle ear space helps to transmit sound.  The air gets there through the eustachian tube. This tube goes from the back of the nose (nasopharynx) to the middle ear space. It keeps the pressure in the middle ear the same as the outside world. It also helps to drain fluid from the middle ear space. CAUSES  Serous otitis media occurs when the eustachian tube gets blocked. Blockage can come from:  Ear infections.  Colds and other upper respiratory infections.  Allergies.  Irritants such as cigarette smoke.  Sudden changes in air pressure (such as descending in an airplane).  Enlarged adenoids.  A mass in the nasopharynx. During colds and upper respiratory infections, the middle ear space can become temporarily filled with fluid. This can happen after an ear infection also. Once the infection clears, the fluid will generally drain out of the ear through the eustachian tube. If it does not, then serous otitis media occurs. SIGNS AND SYMPTOMS   Hearing loss.  A feeling of fullness in the ear, without pain.  Young children may not show any symptoms but may show slight behavioral changes, such as agitation, ear pulling, or crying. DIAGNOSIS  Serous otitis media is diagnosed by an ear exam. Tests may be done to check on the movement of the eardrum. Hearing exams may also be done. TREATMENT  The fluid most often goes  away without treatment. If allergy is the cause, allergy treatment may be helpful. Fluid that persists for several months may require minor surgery. A small tube is placed in the eardrum to:  Drain the fluid.  Restore the air in the middle ear space. In certain situations, antibiotic medicines are used to avoid surgery. Surgery may be done to remove enlarged adenoids (if this is the cause). HOME CARE INSTRUCTIONS   Keep children away from tobacco smoke.  Keep all follow-up visits as directed by your health  care provider. SEEK MEDICAL CARE IF:   Your hearing is not better in 3 months.  Your hearing is worse.  You have ear pain.  You have drainage from the ear.  You have dizziness.  You have serous otitis media only in one ear or have any bleeding from your nose (epistaxis).  You notice a lump on your neck. MAKE SURE YOU:  Understand these instructions.   Will watch your condition.   Will get help right away if you are not doing well or get worse.    This information is not intended to replace advice given to you by your health care provider. Make sure you discuss any questions you have with your health care provider.   Document Released: 02/02/2004 Document Revised: 12/03/2014 Document Reviewed: 06/09/2013 Elsevier Interactive Patient Education [employer].

## 2016-05-02 DIAGNOSIS — E119 Type 2 diabetes mellitus without complications: Secondary | ICD-10-CM | POA: Diagnosis not present

## 2016-05-02 DIAGNOSIS — I309 Acute pericarditis, unspecified: Secondary | ICD-10-CM | POA: Diagnosis not present

## 2016-05-02 DIAGNOSIS — M26601 Right temporomandibular joint disorder, unspecified: Secondary | ICD-10-CM | POA: Diagnosis not present

## 2016-05-02 DIAGNOSIS — E039 Hypothyroidism, unspecified: Secondary | ICD-10-CM | POA: Diagnosis not present

## 2016-05-04 ENCOUNTER — Telehealth: Payer: Self-pay | Admitting: Cardiology

## 2016-05-04 NOTE — Telephone Encounter (Signed)
New Message   Pt c/o medication issue:  1. Name of Medication: per pt tramadol 50 mg  2. How are you currently taking this medication (dosage and times per day)? Per pt Not taking   3. Are you having a reaction (difficulty breathing--STAT)? no  4. What is your medication issue? Pt  States she is currently taking Xarelto 20mg  and Metroprolol 100mg , but was prescribed tramadol by [facility] doctor. Pt wants to know is it okay to take all the medications together. Please call back to discuss

## 2016-05-04 NOTE — Telephone Encounter (Signed)
Correct there are no drug interactions with these 3 medications.

## 2016-05-04 NOTE — Telephone Encounter (Signed)
Unaware of any interactions w/ tramadol, but will route to pharmD for clarification.

## 2016-05-04 NOTE — Telephone Encounter (Signed)
Advice communicated to patient, who verbalized understanding.

## 2016-05-22 ENCOUNTER — Ambulatory Visit: Payer: Self-pay | Admitting: Allergy and Immunology

## 2016-06-13 DIAGNOSIS — M26622 Arthralgia of left temporomandibular joint: Secondary | ICD-10-CM | POA: Insufficient documentation

## 2016-06-13 DIAGNOSIS — H9113 Presbycusis, bilateral: Secondary | ICD-10-CM | POA: Insufficient documentation

## 2016-06-13 DIAGNOSIS — H903 Sensorineural hearing loss, bilateral: Secondary | ICD-10-CM | POA: Diagnosis not present

## 2016-06-13 DIAGNOSIS — H9202 Otalgia, left ear: Secondary | ICD-10-CM | POA: Diagnosis not present

## 2016-11-14 DIAGNOSIS — E559 Vitamin D deficiency, unspecified: Secondary | ICD-10-CM | POA: Diagnosis not present

## 2016-11-14 DIAGNOSIS — E119 Type 2 diabetes mellitus without complications: Secondary | ICD-10-CM | POA: Diagnosis not present

## 2016-11-14 DIAGNOSIS — I10 Essential (primary) hypertension: Secondary | ICD-10-CM | POA: Diagnosis not present

## 2016-11-14 DIAGNOSIS — M2550 Pain in unspecified joint: Secondary | ICD-10-CM | POA: Diagnosis not present

## 2016-11-14 DIAGNOSIS — E039 Hypothyroidism, unspecified: Secondary | ICD-10-CM | POA: Diagnosis not present

## 2016-11-20 ENCOUNTER — Telehealth: Payer: Self-pay | Admitting: Cardiology

## 2016-11-20 NOTE — Telephone Encounter (Signed)
Spoke to patient. Discussion regarding her concern about a price increase on xarelto from $45 to $156. Informed her this is probably related to the coverage year deductible requirement and that she will need to make sure to check her Rx benefits to figure out how this will be handled, but that her copay should go down once deductible requirement is met. She voiced understanding and thanks. Also called regarding a concern for alogliptin and metformin combo Darlene Boyer) - she was under the impression that the alogliptin was for BP and she did not want to take this due to already being on a BP medication. I did advise her that this med works primarily by reducing blood sugar, but that she should discuss concerns about starting the medication w the prescribing physician. She voiced understanding and thanks - notes she will follow up accordingly. Aware to call if concerns for which we can be of further assistance.

## 2016-11-20 NOTE — Telephone Encounter (Signed)
New Message  Pt c/o medication issue:  1. Name of Medication:   rivaroxaban (XARELTO) 20 MG TABS tablet     2. How are you currently taking this medication (dosage and times per day)? 1 time daily  3. Are you having a reaction (difficulty breathing--STAT)? No  4. What is your medication issue? Cost has gone up, and wants something cheaper  Per patient doesn't want to take Colvin Caroli already taking metformin.

## 2017-01-06 ENCOUNTER — Other Ambulatory Visit: Payer: Self-pay | Admitting: Cardiology

## 2017-03-19 ENCOUNTER — Encounter: Payer: Self-pay | Admitting: Cardiology

## 2017-04-08 NOTE — Progress Notes (Deleted)
Cardiology Office Note   Date:  04/08/2017   ID:  [name], DOB [birth year], MRN [id number]  PCP:  Foye Spurling, MD  Cardiologist:   Minus Breeding, MD   No chief complaint on file.     History of Present Illness: Darlene Boyer is a 76 y.o. female who presents for evaluation of atrial fibrillation. She was noted to be in fibrillation at previous visit.  I put a monitor on her.  This demonstrated atrial flutter with variable conduction with controlled response for the most part and ventricular ectopy at times in a bigeminal pattern. Bradycardic events are most pronounced during sleeping. She was started on anticoagulation.  ***    at the last visit. She has felt well. She rarely feels palpitations. This happens sometimes when she goes to the bathroom. She might have when she is hot. She otherwise denies any cardiovascular symptoms. She's actually lost 14 pounds through diet and exercise. The patient denies any new symptoms such as chest discomfort, neck or arm discomfort. There has been no new shortness of breath, PND or orthopnea.    Past Medical History:  Diagnosis Date  . Atrial fibrillation ([place])   . Diabetes mellitus without complication ([place])   . Hypertension   . Thyroid disease     Past Surgical History:  Procedure Laterality Date  . ABDOMINAL HYSTERECTOMY    . ANKLE FRACTURE SURGERY Right   . EYE SURGERY     Lens implants  . GALLBLADDER SURGERY    . TUBAL LIGATION       Current Outpatient Prescriptions  Medication Sig Dispense Refill  . levothyroxine (SYNTHROID, LEVOTHROID) 100 MCG tablet Take 100 mcg by mouth daily before breakfast.    . loratadine (CLARITIN) 10 MG tablet Take 1 tablet (10 mg total) by mouth daily. 30 tablet 0  . metFORMIN (GLUCOPHAGE) 1000 MG tablet Take 1,000 mg by mouth 2 (two) times daily with a meal.    . metoprolol succinate (TOPROL-XL) 100 MG 24 hr tablet Take 100 mg by mouth daily.  5  . SYMBICORT 160-4.5 MCG/ACT inhaler  Inhale 1 puff into the lungs 2 (two) times daily.  2  . XARELTO 20 MG TABS tablet TAKE 1 TABLET BY MOUTH DAILY WITH SUPPER 30 tablet 5   No current facility-administered medications for this visit.     Allergies:   Patient has no known allergies.   ROS:  Please see the history of present illness.   Otherwise, review of systems are positive for ***.   All other systems are reviewed and negative.    PHYSICAL EXAM: VS:  There were no vitals taken for this visit. , BMI There is no height or weight on file to calculate BMI.  GENERAL:  Well appearing HEENT:  Pupils equal round and reactive, fundi not visualized, oral mucosa unremarkable NECK:  No jugular venous distention, waveform within normal limits, carotid upstroke brisk and symmetric, no bruits, no thyromegaly LYMPHATICS:  No cervical, inguinal adenopathy LUNGS:  Clear to auscultation bilaterally BACK:  No CVA tenderness CHEST:  Unremarkable HEART:  PMI not displaced or sustained,S1 and S2 within normal limits, no S3, no S4, no clicks, no rubs, *** murmurs ABD:  Flat, positive bowel sounds normal in frequency in pitch, no bruits, no rebound, no guarding, no midline pulsatile mass, no hepatomegaly, no splenomegaly EXT:  2 plus pulses throughout, no edema, no cyanosis no clubbing SKIN:  No rashes no nodules NEURO:  Cranial nerves II through  XII grossly intact, motor grossly intact throughout PSYCH:  Cognitively intact, oriented to person place and time  GENERAL:  Well appearing NECK:  No jugular venous distention, waveform within normal limits, carotid upstroke brisk and symmetric, no bruits, no thyromegaly LUNGS:  Clear to auscultation bilaterally BACK:  No CVA tenderness CHEST:  Unremarkable HEART:  PMI not displaced or sustained,S1 and S2 within normal limits, no S3, no S4, no clicks, no rubs, no  murmurs ABD:  Flat, positive bowel sounds normal in frequency in pitch, no bruits, no rebound, no guarding, no midline pulsatile mass, no  hepatomegaly, no splenomegaly EXT:  2 plus pulses throughout, no edema, no cyanosis no clubbing   EKG:  EKG is  *** not ordered today.   Recent Labs: No results found for requested labs within last 8760 hours.    Lipid Panel No results found for: CHOL, TRIG, HDL, CHOLHDL, VLDL, LDLCALC, LDLDIRECT    Wt Readings from Last 3 Encounters:  04/09/16 240 lb 6.4 oz (109 kg)  02/23/16 254 lb (115.2 kg)  01/05/14 230 lb (104.3 kg)      Other studies Reviewed: Additional studies/ records that were reviewed today include:  *** Review of the above records demonstrates:   ***   ASSESSMENT AND PLAN:  ATRIAL FIB/FLUTTER:   [name] has a CHA2DS2 - VASc score of 4 with a risk of stroke of 4%. *** She tolerates rate control and anticoagulation. No change in therapy is indicated.   HTN:  *** The blood pressure is elevated today. However, she says at home is always under 494 systolic. Therefore, no change in therapy is indicated.    Current medicines are reviewed at length with the patient today.  The patient does not have concerns regarding medicines.  The following changes have been made:  ***  Labs/ tests ordered today include: ***  No orders of the defined types were placed in this encounter.    Disposition:   FU with me in  *** months.     Signed, Minus Breeding, MD  04/08/2017 9:16 PM    [facility]

## 2017-04-09 ENCOUNTER — Ambulatory Visit: Payer: Medicare Other | Admitting: Cardiology

## 2017-04-10 NOTE — Progress Notes (Signed)
Cardiology Office Note   Date:  04/11/2017   ID:  Darlene Boyer, DOB Nov 02, 1941, MRN [id number]  PCP:  Foye Spurling, MD  Cardiologist:   Minus Breeding, MD   Chief Complaint  Patient presents with  . Atrial Fibrillation      History of Present Illness: Darlene Boyer is a 76 y.o. female who presents for evaluation of atrial fibrillation. She was noted to be in fibrillation at previous visit.  I put a monitor on her.  This demonstrated atrial flutter with variable conduction with controlled response for the most part and ventricular ectopy at times in a bigeminal pattern. Bradycardic events are most pronounced during sleeping. She was started on anticoagulation.  Since then she has done well.  She does feel the palpitations when she is stressed.  She has had increased stress.  Her son lost his house in the tornado. The patient denies any new symptoms such as chest discomfort, neck or arm discomfort. There has been no new shortness of breath, PND or orthopnea. There has been no presyncope or syncope.   Past Medical History:  Diagnosis Date  . Atrial fibrillation ([place])   . Diabetes mellitus without complication ([place])   . Hypertension   . Thyroid disease     Past Surgical History:  Procedure Laterality Date  . ABDOMINAL HYSTERECTOMY    . ANKLE FRACTURE SURGERY Right   . EYE SURGERY     Lens implants  . GALLBLADDER SURGERY    . TUBAL LIGATION       Current Outpatient Prescriptions  Medication Sig Dispense Refill  . levothyroxine (SYNTHROID, LEVOTHROID) 100 MCG tablet Take 100 mcg by mouth daily before breakfast.    . metFORMIN (GLUCOPHAGE) 1000 MG tablet Take 1,000 mg by mouth 2 (two) times daily with a meal.    . metoprolol succinate (TOPROL-XL) 100 MG 24 hr tablet Take 100 mg by mouth daily.  5  . XARELTO 20 MG TABS tablet TAKE 1 TABLET BY MOUTH DAILY WITH SUPPER 30 tablet 5   No current facility-administered medications for this visit.     Allergies:    Patient has no known allergies.   ROS:  Please see the history of present illness.   Otherwise, review of systems are positive for none.   All other systems are reviewed and negative.    PHYSICAL EXAM: VS:  BP (!) 142/76   Pulse 80   Ht 5\' 5"  (1.651 m)   Wt 244 lb (110.7 kg)   SpO2 98%   BMI 40.60 kg/m  , BMI Body mass index is 40.6 kg/m.  GENERAL:  Well appearing NECK:  No jugular venous distention, waveform within normal limits, carotid upstroke brisk and symmetric, no bruits, no thyromegaly LUNGS:  Clear to auscultation bilaterally BACK:  No CVA tenderness CHEST:  Unremarkable HEART:  PMI not displaced or sustained,S1 and S2 within normal limits, no N0,UV clicks, no rubs, no murmurs, irregular ABD:  Flat, positive bowel sounds normal in frequency in pitch, no bruits, no rebound, no guarding, no midline pulsatile mass, no hepatomegaly, no splenomegaly EXT:  2 plus pulses throughout, no edema, no cyanosis no clubbing   EKG:  EKG is   not ordered today. Atrial fibrillation, rate, axis within normal limits, intervals within normal limits, no acute ST-T wave changes.  Recent Labs: No results found for requested labs within last 8760 hours.    Lipid Panel No results found for: CHOL, TRIG, HDL, CHOLHDL, VLDL, LDLCALC, LDLDIRECT  Wt Readings from Last 3 Encounters:  04/11/17 244 lb (110.7 kg)  04/09/16 240 lb 6.4 oz (109 kg)  02/23/16 254 lb (115.2 kg)      Other studies Reviewed: Additional studies/ records that were reviewed today include:  None Review of the above records demonstrates:      ASSESSMENT AND PLAN:  ATRIAL FIB/FLUTTER:   [name] has a CHA2DS2 - VASc score of 4 with a risk of stroke of 4%. The patient  tolerates this rhythm and rate control and anticoagulation. We will continue with the meds as listed.  HTN:  The blood pressure is at target. No change in medications is indicated. We will continue with therapeutic lifestyle changes (TLC).    Current medicines are reviewed at length with the patient today.  The patient does not have concerns regarding medicines.  The following changes have been made:  None  Labs/ tests ordered today include: None  Orders Placed This Encounter  Procedures  . EKG 12-Lead     Disposition:   FU with me in  12 months.     Signed, Minus Breeding, MD  04/11/2017 12:46 PM    [place]

## 2017-04-11 ENCOUNTER — Encounter: Payer: Self-pay | Admitting: Cardiology

## 2017-04-11 ENCOUNTER — Ambulatory Visit (INDEPENDENT_AMBULATORY_CARE_PROVIDER_SITE_OTHER): Payer: Medicare Other | Admitting: Cardiology

## 2017-04-11 VITALS — BP 142/76 | HR 80 | Ht 65.0 in | Wt 244.0 lb

## 2017-04-11 DIAGNOSIS — I10 Essential (primary) hypertension: Secondary | ICD-10-CM | POA: Diagnosis not present

## 2017-04-11 DIAGNOSIS — I482 Chronic atrial fibrillation: Secondary | ICD-10-CM | POA: Diagnosis not present

## 2017-04-11 DIAGNOSIS — I4821 Permanent atrial fibrillation: Secondary | ICD-10-CM

## 2017-04-11 NOTE — Patient Instructions (Signed)
Medication Instructions:  Continue current medications  Labwork: None Ordered  Testing/Procedures: None Ordered  Follow-Up: Your physician wants you to follow-up in: 1 Year. You will receive a reminder letter in the mail two months in advance. If you don't receive a letter, please call our office to schedule the follow-up appointment.   Any Other Special Instructions Will Be Listed Below (If Applicable).   Mediterranean Diet   If you need a refill on your cardiac medications before your next appointment, please call your pharmacy.

## 2017-04-23 ENCOUNTER — Other Ambulatory Visit: Payer: Self-pay | Admitting: Pharmacist

## 2017-04-23 MED ORDER — RIVAROXABAN 20 MG PO TABS: ORAL_TABLET | ORAL | 0 refills | Status: DC

## 2017-06-13 ENCOUNTER — Other Ambulatory Visit: Payer: Self-pay | Admitting: Cardiology

## 2017-06-14 NOTE — Telephone Encounter (Signed)
Dr Jaci Standard (PCP) office closed for vacations until 06/17/2017. Most recent blood work  Available > 75 year old  Will refill for 30 days only. Need recent blood work prior to next refill authorization

## 2017-07-15 ENCOUNTER — Other Ambulatory Visit: Payer: Self-pay | Admitting: Cardiology

## 2017-07-16 ENCOUNTER — Other Ambulatory Visit: Payer: Self-pay | Admitting: Cardiology

## 2017-07-16 DIAGNOSIS — I4892 Unspecified atrial flutter: Secondary | ICD-10-CM

## 2017-07-16 MED ORDER — RIVAROXABAN 20 MG PO TABS: ORAL_TABLET | ORAL | 0 refills | Status: DC

## 2017-07-16 NOTE — Telephone Encounter (Signed)
Follow up ° ° °Pt calling back °

## 2017-07-16 NOTE — Telephone Encounter (Signed)
Returned call to patient advised she needs to have a bmet done.Stated she will have done here at [facility] this Friday 07/19/17.Xarelto refill sent to pharmacy.

## 2017-07-16 NOTE — Telephone Encounter (Signed)
New message   Pt was unaware she needs blood work done and there eis not orders in system   *STAT* If patient is at the pharmacy, call can be transferred to refill team.   1. Which medications need to be refilled? (please list name of each medication and dose if known)  xarelto  2. Which pharmacy/location (including street and city if local pharmacy) is medication to be sent to? [facility] [address]   3. Do they need a 30 day or 90 day supply?  [place]

## 2017-07-16 NOTE — Telephone Encounter (Signed)
Returned call to patient no answer.No voice mail. 

## 2017-07-17 ENCOUNTER — Other Ambulatory Visit: Payer: Self-pay | Admitting: Cardiology

## 2017-07-19 LAB — BASIC METABOLIC PANEL
BUN / CREAT RATIO: 15 (ref 12–28)
BUN: 15 mg/dL (ref 8–27)
CALCIUM: 9.1 mg/dL (ref 8.7–10.3)
CHLORIDE: 102 mmol/L (ref 96–106)
CO2: 25 mmol/L (ref 20–29)
Creatinine, Ser: 1 mg/dL (ref 0.57–1.00)
GFR, EST AFRICAN AMERICAN: 64 mL/min/{1.73_m2} (ref >59)
GFR, EST NON AFRICAN AMERICAN: 56 mL/min/{1.73_m2} — AB (ref >59)
Glucose: 208 mg/dL — ABNORMAL HIGH (ref 65–99)
POTASSIUM: 4.6 mmol/L (ref 3.5–5.2)
SODIUM: 141 mmol/L (ref 134–144)

## 2017-07-23 ENCOUNTER — Telehealth: Payer: Self-pay | Admitting: Cardiology

## 2017-07-23 NOTE — Telephone Encounter (Signed)
Pt has been advised of results and I have forwarded labs to Dr. Carlis Abbott.

## 2017-07-23 NOTE — Telephone Encounter (Signed)
Pt returned office call please give her a call back with lab results

## 2017-08-13 ENCOUNTER — Encounter (HOSPITAL_COMMUNITY): Payer: Self-pay | Admitting: Emergency Medicine

## 2017-08-13 ENCOUNTER — Ambulatory Visit (HOSPITAL_COMMUNITY)
Admission: EM | Admit: 2017-08-13 | Discharge: 2017-08-13 | Disposition: A | Discharge status: Home / Self Care | Payer: Medicare Other | Attending: Family Medicine | Admitting: Family Medicine

## 2017-08-13 DIAGNOSIS — H9222 Otorrhagia, left ear: Secondary | ICD-10-CM | POA: Diagnosis not present

## 2017-08-13 NOTE — ED Provider Notes (Signed)
[facility]    CSN: [id number] Arrival date & time: 08/13/17  1119     History   Chief Complaint Chief Complaint  Patient presents with  . Ear Problem    HPI Darlene Boyer is a 76 y.o. female.   76 year old female with history of A. fib on Xarelto, diabetes, hypertension, thyroid disease, comes in for 1 day history of ear bleeding. Patient states she had felt itchiness of her left ear, and picked at her ear with fingernails last night. She went to bed, and woke up with blood in her ear, trickling down to her bedding. Given the blood thinner use, she came in for evaluation. She has some weakness, stating it is due to her not having any breakfast. She takes a diabetic medications at night. Denies dizziness, syncope.       Past Medical History:  Diagnosis Date  . Atrial fibrillation ([place])   . Diabetes mellitus without complication ([place])   . Hypertension   . Thyroid disease     Patient Active Problem List   Diagnosis Date Noted  . Atrial flutter ([place]) 04/09/2016  . Hypertension   . Diabetes mellitus without complication ([place])   . Thyroid disease     Past Surgical History:  Procedure Laterality Date  . ABDOMINAL HYSTERECTOMY    . ANKLE FRACTURE SURGERY Right   . EYE SURGERY     Lens implants  . GALLBLADDER SURGERY    . TUBAL LIGATION      OB History    No data available       Home Medications    Prior to Admission medications   Medication Sig Start Date End Date Taking? Authorizing Provider  furosemide (LASIX) 20 MG tablet Take 20 mg by mouth.   Yes [provider]  levothyroxine (SYNTHROID, LEVOTHROID) 100 MCG tablet Take 100 mcg by mouth daily before breakfast.    [provider]  metFORMIN (GLUCOPHAGE) 1000 MG tablet Take 1,000 mg by mouth 2 (two) times daily with a meal.    [provider]  metoprolol succinate (TOPROL-XL) 100 MG 24 hr tablet Take 100 mg by mouth daily. 04/04/16   [provider]    rivaroxaban (XARELTO) 20 MG TABS tablet TAKE 1 TABLET BY MOUTH DAILY WITH SUPPER. 07/16/17   Minus Breeding, MD  XARELTO 20 MG TABS tablet TAKE 1 TABLET BY MOUTH DAILY WITH SUPPER. 07/17/17   Minus Breeding, MD    Family History Family History  Problem Relation Age of Onset  . Emphysema Father   . Lung cancer Mother     Social History Social History  Substance Use Topics  . Smoking status: Never Smoker  . Smokeless tobacco: Current User    Types: Snuff     Comment: "every now and then"  . Alcohol use No     Allergies   Patient has no known allergies.   Review of Systems Review of Systems  Cardiovascular: Negative for chest pain, palpitations and leg swelling.  Skin: Positive for wound.  Neurological: Negative for dizziness, syncope and light-headedness.     Physical Exam Triage Vital Signs ED Triage Vitals  Enc Vitals Group     BP 08/13/17 1141 125/86     Pulse Rate 08/13/17 1141 74     Resp 08/13/17 1141 20     Temp 08/13/17 1141 98.5 F (36.9 C)     Temp Source 08/13/17 1141 Oral     SpO2 08/13/17 1141 100 %  Weight --      Height --      Head Circumference --      Peak Flow --      Pain Score 08/13/17 1139 8     Pain Loc --      Pain Edu? --      Excl. in [place]? --    No data found.   Updated Vital Signs BP 125/86 (BP Location: Left Arm) Comment: large cuff  Pulse 74   Temp 98.5 F (36.9 C) (Oral)   Resp 20   SpO2 100%   Physical Exam  Constitutional: She is oriented to person, place, and time. She appears well-developed and well-nourished. No distress.  HENT:  Head: Normocephalic and atraumatic.  Right Ear: Tympanic membrane, external ear and ear canal normal. Tympanic membrane is not erythematous and not bulging.  Left Ear: Tympanic membrane normal.  Blood on left external ear. Blood in canal, no obvious active bleeding seen.   Used cotton swab to clean external and opening of canal.   Eyes: Pupils are equal, round, and reactive to  light. Conjunctivae are normal.  Neurological: She is alert and oriented to person, place, and time.     UC Treatments / Results  Labs (all labs ordered are listed, but only abnormal results are displayed) Labs Reviewed - No data to display  EKG  EKG Interpretation None       Radiology No results found.  Procedures Procedures (including critical care time)  Medications Ordered in UC Medications - No data to display   Initial Impression / Assessment and Plan / UC Course  I have reviewed the triage vital signs and the nursing notes.  Pertinent labs & imaging results that were available during my care of the patient were reviewed by me and considered in my medical decision making (see chart for details).     Recheck of ear after 15-20 mins showed mild blood at opening of left ear canal indicating continued bleeding. Discussed case with Dr Mannie Stabile and Dr Joseph Art, who suggested ear wick with epi. Applied ear wick with lidocaine 2% with epi. Patient to remove tomorrow with saline to prevent removal of scab. Discussed with patient, if bleeding continues will need to follow up with ENT for cauterization given Xarelto use. Return precautions given. Patient and daughter expresses understanding and agrees to plan.   Final Clinical Impressions(s) / UC Diagnoses   Final diagnoses:  Otorrhagia of left ear    New Prescriptions Discharge Medication List as of 08/13/2017 12:49 PM         Ok Edwards, PA-C 08/13/17 1302

## 2017-08-13 NOTE — ED Triage Notes (Signed)
States ear was itching last night.  Patient scratched left ear with fingernail.  Left ear bleeding

## 2017-08-13 NOTE — Discharge Instructions (Signed)
We put in an ear wick with epinephrine in attempts to stop the bleeding. When removing wick tomorrow, apply some saline and slowly move the ear wick out to prevent peeling of healing scab. If bleeding continues, follow up with ENT for further evaluation and treatment.

## 2017-08-14 ENCOUNTER — Other Ambulatory Visit: Payer: Self-pay | Admitting: Cardiology

## 2017-08-20 ENCOUNTER — Telehealth: Payer: Self-pay | Admitting: Cardiology

## 2017-08-20 NOTE — Telephone Encounter (Signed)
Patient will prefer to stay on Xarelto (she is stable on it and taking since 01/2016). She will pay for 30 day supply today. We will provide 30 day supply for November, then she will let us know if able to afford December rx or additional help needed.

## 2017-08-20 NOTE — Telephone Encounter (Signed)
Pt says she can not afford her Darlene Boyer can she do?

## 2017-08-20 NOTE — Telephone Encounter (Signed)
Returned call to patient.She stated she cannot afford Xarelto.Advised I will send message to our pharmacist to talk about going on Coumadin.Stated if she is not home to call on cell phone.

## 2017-08-21 NOTE — Telephone Encounter (Signed)
Medication Samples have been provided to the patient.  Drug name: Xarelto       Strength: 20mg        Qty: 28 tabs  LOT:[id number]  Exp.Date: 12/1019  The patient has been instructed regarding the correct time, dose, and frequency of taking this medication, including desired effects and most common side effects.   Darlene Boyer 3:53 PM 08/21/2017

## 2017-08-28 ENCOUNTER — Encounter: Payer: Medicare Other | Attending: Internal Medicine | Admitting: *Deleted

## 2017-08-28 DIAGNOSIS — E119 Type 2 diabetes mellitus without complications: Secondary | ICD-10-CM

## 2017-08-28 DIAGNOSIS — I10 Essential (primary) hypertension: Secondary | ICD-10-CM | POA: Diagnosis not present

## 2017-08-28 DIAGNOSIS — E1165 Type 2 diabetes mellitus with hyperglycemia: Secondary | ICD-10-CM | POA: Insufficient documentation

## 2017-08-28 NOTE — Patient Instructions (Signed)
Plan:  Aim for 3 Carb Choices per meal (45 grams) +/- 1 either way  Aim for 0-1 Carbs per snack if hungry  Include protein in moderation with your meals and snacks Consider reading food labels for Total Carbohydrate of foods Continue with your activity level by walking daily as tolerated Consider checking BG at alternate times per day a Continue taking medication Metformin as directed by MD

## 2017-09-04 NOTE — Progress Notes (Signed)
Diabetes Self-Management Education  Visit Type: First/Initial  Appt. Start Time: 1545 Appt. End Time: 1645  09/04/2017  [name], identified by name and date of birth, is a 76 y.o. female with a diagnosis of Diabetes: Type 2. Patient is up late at night and into the morning, sleeping from 6 AM to 2 PM most days. So her eating habits are not typical. She has not been testing her BG lately but states when checked at her MD office they are typically between 125 and 150 mg/dl.  ASSESSMENT  There were no vitals taken for this visit. There is no height or weight on file to calculate BMI.      Diabetes Self-Management Education - 08/28/17 1549      Visit Information   Visit Type First/Initial     Initial Visit   Diabetes Type Type 2   Are you currently following a meal plan? No   Are you taking your medications as prescribed? Yes   Date Diagnosed 2012     Psychosocial Assessment   Patient Belief/Attitude about Diabetes Motivated to manage diabetes   Other persons present Patient   Patient Concerns Nutrition/Meal planning;Monitoring;Glycemic Control   Special Needs None   How often do you need to have someone help you when you read instructions, pamphlets, or other written materials from your doctor or pharmacy? 1 - Never   What is the last grade level you completed in school? 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 Needs Instruction   Patient understands incorporating nutritional management into lifestyle. Needs Instruction   Patient undertands incorporating physical activity into lifestyle. Needs Instruction   Patient understands using medications safely. Needs Instruction   Patient understands monitoring blood glucose, interpreting and using results Needs Instruction   Patient understands prevention, detection, and treatment of acute complications. Needs Instruction   Patient  understands prevention, detection, and treatment of chronic complications. Needs Instruction   Patient understands how to develop strategies to address psychosocial issues. Needs Instruction   Patient understands how to develop strategies to promote health/change behavior. Needs Instruction     Complications   Last HgB A1C per patient/outside source 9.6 %   How often do you check your blood sugar? Patient declines   Have you had a dilated eye exam in the past 12 months? Yes   Have you had a dental exam in the past 12 months? No   Are you checking your feet? Yes   How many days per week are you checking your feet? 7     Dietary Intake   Breakfast skip   Snack (morning) fresh fruit   Lunch 5 - 6 PM: protein with salad with fat free [place]ina dressing   Dinner 1130 PM: small bowl cereal (Special K) 1% Lactaid milk   Snack (evening) chips (out of the bag)   Beverage(s) water with flavoring all day     Exercise   Exercise Type Light (walking / raking leaves)   How many days per week to you exercise? 7   How many minutes per day do you exercise? 30   Total minutes per week of exercise 210     Patient Education   Previous Diabetes Education No   Disease state  Definition of diabetes, type 1 and 2, and the diagnosis of diabetes   Nutrition management  Role of diet in the treatment of diabetes and the relationship between  the three main macronutrients and blood glucose level;Food label reading, portion sizes and measuring food.;Carbohydrate counting   Physical activity and exercise  Role of exercise on diabetes management, blood pressure control and cardiac health.   Medications Reviewed patients medication for diabetes, action, purpose, timing of dose and side effects.   Monitoring Purpose and frequency of SMBG.;Identified appropriate SMBG and/or A1C goals.   Chronic complications Relationship between chronic complications and blood glucose control   Psychosocial adjustment Role of stress on  diabetes     Individualized Goals (developed by patient)   Nutrition Follow meal plan discussed   Physical Activity Exercise 3-5 times per week   Monitoring  test my blood glucose as discussed     Post-Education Assessment   Patient understands the diabetes disease and treatment process. Demonstrates understanding / competency   Patient understands incorporating nutritional management into lifestyle. Demonstrates understanding / competency   Patient undertands incorporating physical activity into lifestyle. Demonstrates understanding / competency   Patient understands using medications safely. Demonstrates understanding / competency   Patient understands monitoring blood glucose, interpreting and using results Demonstrates understanding / competency   Patient understands prevention, detection, and treatment of acute complications. Demonstrates understanding / competency   Patient understands prevention, detection, and treatment of chronic complications. Demonstrates understanding / competency   Patient understands how to develop strategies to address psychosocial issues. Demonstrates understanding / competency   Patient understands how to develop strategies to promote health/change behavior. Demonstrates understanding / competency     Outcomes   Expected Outcomes Demonstrated interest in learning. Expect positive outcomes   Future DMSE PRN   Program Status Completed      Individualized Plan for Diabetes Self-Management Training:   Learning Objective:  Patient will have a greater understanding of diabetes self-management. Patient education plan is to attend individual and/or group sessions per assessed needs and concerns.   Plan:   Patient Instructions  Plan:  Aim for 3 Carb Choices per meal (45 grams) +/- 1 either way  Aim for 0-1 Carbs per snack if hungry  Include protein in moderation with your meals and snacks Consider reading food labels for Total Carbohydrate of  foods Continue with your activity level by walking daily as tolerated Consider checking BG at alternate times per day a Continue taking medication Metformin as directed by MD  Expected Outcomes:  Demonstrated interest in learning. Expect positive outcomes  Education material provided: Living Well with Diabetes, Meal plan card and Carbohydrate counting sheet  If problems or questions, patient to contact team via:  Phone  Future DSME appointment: PRN

## 2018-02-11 ENCOUNTER — Other Ambulatory Visit: Payer: Self-pay | Admitting: Cardiology

## 2018-02-11 MED ORDER — RIVAROXABAN 20 MG PO TABS: ORAL_TABLET | ORAL | 0 refills | Status: DC

## 2018-02-11 NOTE — Telephone Encounter (Signed)
°*  STAT* If patient is at the pharmacy, call can be transferred to refill team.   1. Which medications need to be refilled? (please list name of each medication and dose if known) Xarelto 20 mg   2. Which pharmacy/location (including street and city if local pharmacy) is medication to be sent to?[id number] - [place], [place] - [address] AT [address]. Do they need a 30 day or 90 day supply? [place]

## 2018-03-05 ENCOUNTER — Other Ambulatory Visit: Payer: Self-pay | Admitting: Cardiology

## 2018-04-23 NOTE — Progress Notes (Deleted)
Cardiology Office Note   Date:  04/23/2018   ID:  VERONICA FRETZ, DOB 1941-03-29, MRN [id number]  PCP:  Foye Spurling, MD  Cardiologist:   Minus Breeding, MD   No chief complaint on file.     History of Present Illness: Darlene Boyer is a 77 y.o. female who presents for evaluation of atrial fibrillation. She was noted to be in fibrillation at previous visit.  I put a monitor on her.  This demonstrated atrial flutter with variable conduction with controlled response for the most part and ventricular ectopy at times in a bigeminal pattern. Bradycardic events are most pronounced during sleeping. She was started on anticoagulation.  She returns for follow up.  ***   Since then she has done well.  She does feel the palpitations when she is stressed.  She has had increased stress.  Her son lost his house in the tornado. The patient denies any new symptoms such as chest discomfort, neck or arm discomfort. There has been no new shortness of breath, PND or orthopnea. There has been no presyncope or syncope.   Past Medical History:  Diagnosis Date  . Atrial fibrillation ([place])   . Diabetes mellitus without complication ([place])   . Hypertension   . Thyroid disease     Past Surgical History:  Procedure Laterality Date  . ABDOMINAL HYSTERECTOMY    . ANKLE FRACTURE SURGERY Right   . EYE SURGERY     Lens implants  . GALLBLADDER SURGERY    . TUBAL LIGATION       Current Outpatient Medications  Medication Sig Dispense Refill  . furosemide (LASIX) 20 MG tablet Take 20 mg by mouth.    . levothyroxine (SYNTHROID, LEVOTHROID) 100 MCG tablet Take 100 mcg by mouth daily before breakfast.    . metFORMIN (GLUCOPHAGE) 1000 MG tablet Take 1,000 mg by mouth 2 (two) times daily with a meal.    . metoprolol succinate (TOPROL-XL) 100 MG 24 hr tablet Take 100 mg by mouth daily.  5  . XARELTO 20 MG TABS tablet TAKE 1 TABLET BY MOUTH DAILY WITH SUPPER. 60 tablet 0   No current  facility-administered medications for this visit.     Allergies:   Patient has no known allergies.   ROS:  Please see the history of present illness.   Otherwise, review of systems are positive for ***.   All other systems are reviewed and negative.    PHYSICAL EXAM: VS:  There were no vitals taken for this visit. , BMI There is no height or weight on file to calculate BMI.  GENERAL:  Well appearing NECK:  No jugular venous distention, waveform within normal limits, carotid upstroke brisk and symmetric, no bruits, no thyromegaly LUNGS:  Clear to auscultation bilaterally CHEST:  Unremarkable HEART:  PMI not displaced or sustained,S1 and S2 within normal limits, no S3, no S4, no clicks, no rubs, *** murmurs ABD:  Flat, positive bowel sounds normal in frequency in pitch, no bruits, no rebound, no guarding, no midline pulsatile mass, no hepatomegaly, no splenomegaly EXT:  2 plus pulses throughout, no edema, no cyanosis no clubbing   GENERAL:  Well appearing NECK:  No jugular venous distention, waveform within normal limits, carotid upstroke brisk and symmetric, no bruits, no thyromegaly LUNGS:  Clear to auscultation bilaterally BACK:  No CVA tenderness CHEST:  Unremarkable HEART:  PMI not displaced or sustained,S1 and S2 within normal limits, no P8,EU clicks, no rubs, no murmurs, irregular ABD:  Flat, positive bowel sounds normal in frequency in pitch, no bruits, no rebound, no guarding, no midline pulsatile mass, no hepatomegaly, no splenomegaly EXT:  2 plus pulses throughout, no edema, no cyanosis no clubbing   EKG:  EKG is *** ordered today. Atrial fibrillation, rate ***, axis within normal limits, intervals within normal limits, no acute ST-T wave changes.  Recent Labs: 07/19/2017: BUN 15; Creatinine, Ser 1.00; Potassium 4.6; Sodium 141    Lipid Panel No results found for: CHOL, TRIG, HDL, CHOLHDL, VLDL, LDLCALC, LDLDIRECT    Wt Readings from Last 3 Encounters:  04/11/17 244  lb (110.7 kg)  04/09/16 240 lb 6.4 oz (109 kg)  02/23/16 254 lb (115.2 kg)      Other studies Reviewed: Additional studies/ records that were reviewed today include:  *** Review of the above records demonstrates:   ***   ASSESSMENT AND PLAN:  ATRIAL FIB/FLUTTER:   Darlene Boyer has a CHA2DS2 - VASc score of 4 with a risk of stroke of 4%. ***  The patient  tolerates this rhythm and rate control and anticoagulation. We will continue with the meds as listed.  HTN:  The blood pressure is *** at target. No change in medications is indicated. We will continue with therapeutic lifestyle changes (TLC).   Current medicines are reviewed at length with the patient today.  The patient does not have concerns regarding medicines.  The following changes have been made:  ***  Labs/ tests ordered today include: ***  No orders of the defined types were placed in this encounter.    Disposition:   FU with me in  *** months.     Signed, Minus Breeding, MD  04/23/2018 12:42 PM    [facility]

## 2018-04-24 ENCOUNTER — Ambulatory Visit: Payer: Medicare Other | Admitting: Cardiology

## 2018-05-05 NOTE — Progress Notes (Signed)
Cardiology Office Note   Date:  05/06/2018   ID:  Darlene Boyer, DOB [birth year], MRN [id number]  PCP:  Foye Spurling, MD  Cardiologist:   Minus Breeding, MD   Chief Complaint  Patient presents with  . Atrial Fibrillation      History of Present Illness: Darlene Boyer is a 77 y.o. female who presents for evaluation of atrial fibrillation. She was noted to be in fibrillation at previous visit.  I put a monitor on her.  This demonstrated atrial flutter with variable conduction with controlled response for the most part and ventricular ectopy at times in a bigeminal pattern. Bradycardic events are most pronounced during sleeping. She was started on anticoagulation.  She returns for follow up.     Since then she has done well.  She does feel the palpitations when she is stressed.  She has had increased stress.  Her son lost his house in the tornado. The patient denies any new symptoms such as chest discomfort, neck or arm discomfort. There has been no new shortness of breath, PND or orthopnea. There has been no presyncope or syncope.   Past Medical History:  Diagnosis Date  . Atrial fibrillation ([place])   . Diabetes mellitus without complication ([place])   . Hypertension   . Thyroid disease     Past Surgical History:  Procedure Laterality Date  . ABDOMINAL HYSTERECTOMY    . ANKLE FRACTURE SURGERY Right   . EYE SURGERY     Lens implants  . GALLBLADDER SURGERY    . TUBAL LIGATION       Current Outpatient Medications  Medication Sig Dispense Refill  . furosemide (LASIX) 20 MG tablet Take 20 mg by mouth.    . levothyroxine (SYNTHROID, LEVOTHROID) 100 MCG tablet Take 100 mcg by mouth daily before breakfast.    . metFORMIN (GLUCOPHAGE) 1000 MG tablet Take 1,000 mg by mouth 2 (two) times daily with a meal.    . metoprolol succinate (TOPROL-XL) 100 MG 24 hr tablet Take 100 mg by mouth daily.  5  . XARELTO 20 MG TABS tablet TAKE 1 TABLET BY MOUTH DAILY WITH SUPPER. 60  tablet 0   No current facility-administered medications for this visit.     Allergies:   Patient has no known allergies.   ROS:  Please see the history of present illness.   Otherwise, review of systems are positive for none.   All other systems are reviewed and negative.    PHYSICAL EXAM: VS:  BP 138/72   Pulse 69   Ht 5\' 4"  (1.626 m)   Wt 245 lb 9.6 oz (111.4 kg)   BMI 42.16 kg/m  , BMI Body mass index is 42.16 kg/m.  GENERAL:  Well appearing NECK:  No jugular venous distention, waveform within normal limits, carotid upstroke brisk and symmetric, no bruits, no thyromegaly LUNGS:  Clear to auscultation bilaterally CHEST:  Unremarkable HEART:  PMI not displaced or sustained,S1 and S2 within normal limits, no S3,  no clicks, no rubs, no murmurs, irregular  ABD:  Flat, positive bowel sounds normal in frequency in pitch, no bruits, no rebound, no guarding, no midline pulsatile mass, no hepatomegaly, no splenomegaly EXT:  2 plus pulses throughout, no edema, no cyanosis no clubbing    EKG:  EKG is  ordered today. Atrial fibrillation, rate 69, axis within normal limits, intervals within normal limits, no acute ST-T wave changes.  Recent Labs: 07/19/2017: BUN 15; Creatinine, Ser 1.00; Potassium  4.6; Sodium 141    Lipid Panel No results found for: CHOL, TRIG, HDL, CHOLHDL, VLDL, LDLCALC, LDLDIRECT    Wt Readings from Last 3 Encounters:  05/06/18 245 lb 9.6 oz (111.4 kg)  04/11/17 244 lb (110.7 kg)  04/09/16 240 lb 6.4 oz (109 kg)      Other studies Reviewed: Additional studies/ records that were reviewed today include:  None Review of the above records demonstrates:      ASSESSMENT AND PLAN:  ATRIAL FIB/FLUTTER:   Darlene Boyer has a CHA2DS2 - VASc score of 4 with a risk of stroke of 4%. The patient  tolerates this rhythm and rate control and anticoagulation. We will continue with the meds as listed.  HTN:  The blood pressure is at target.  No change in therapy.    DM:  A1C is 8.6.  This is being treated by Foye Spurling, MD  Current medicines are reviewed at length with the patient today.  The patient does not have concerns regarding medicines.  The following changes have been made:  None  Labs/ tests ordered today include: None  No orders of the defined types were placed in this encounter.    Disposition:   FU with me in  12 months.     Signed, Minus Breeding, MD  05/06/2018 3:03 PM    [facility]

## 2018-05-06 ENCOUNTER — Ambulatory Visit: Payer: Medicare Other | Admitting: Cardiology

## 2018-05-06 ENCOUNTER — Encounter: Payer: Self-pay | Admitting: Cardiology

## 2018-05-06 VITALS — BP 138/72 | HR 69 | Ht 64.0 in | Wt 245.6 lb

## 2018-05-06 DIAGNOSIS — I10 Essential (primary) hypertension: Secondary | ICD-10-CM | POA: Diagnosis not present

## 2018-05-06 DIAGNOSIS — I4821 Permanent atrial fibrillation: Secondary | ICD-10-CM

## 2018-05-06 DIAGNOSIS — I482 Chronic atrial fibrillation: Secondary | ICD-10-CM

## 2018-05-06 NOTE — Patient Instructions (Signed)
Medication Instructions:  Continue current medications  If you need a refill on your cardiac medications before your next appointment, please call your pharmacy.  Labwork: None Ordered   Testing/Procedures: None Ordered  Follow-Up: Your physician wants you to follow-up in: 1 Year. You should receive a reminder letter in the mail two months in advance. If you do not receive a letter, please call our office [phone].    Thank you for choosing [facility]!!      

## 2018-05-09 NOTE — Addendum Note (Signed)
Addended by: Vennie Homans on: 05/09/2018 11:36 AM   Modules accepted: Orders

## 2018-05-14 ENCOUNTER — Other Ambulatory Visit: Payer: Self-pay | Admitting: Cardiology

## 2018-05-14 NOTE — Telephone Encounter (Signed)
Rx request sent to pharmacy.  

## 2018-08-21 ENCOUNTER — Other Ambulatory Visit: Payer: Self-pay | Admitting: Cardiology

## 2018-10-14 ENCOUNTER — Other Ambulatory Visit: Payer: Self-pay | Admitting: Family Medicine

## 2018-10-14 DIAGNOSIS — Z13820 Encounter for screening for osteoporosis: Secondary | ICD-10-CM

## 2018-10-14 DIAGNOSIS — E2839 Other primary ovarian failure: Secondary | ICD-10-CM

## 2018-12-01 ENCOUNTER — Other Ambulatory Visit: Payer: Self-pay

## 2018-12-01 ENCOUNTER — Encounter (HOSPITAL_COMMUNITY): Payer: Self-pay

## 2018-12-01 ENCOUNTER — Ambulatory Visit (HOSPITAL_COMMUNITY)
Admission: EM | Admit: 2018-12-01 | Discharge: 2018-12-01 | Disposition: A | Discharge status: Home / Self Care | Payer: Medicare Other | Attending: Internal Medicine | Admitting: Internal Medicine

## 2018-12-01 DIAGNOSIS — J209 Acute bronchitis, unspecified: Secondary | ICD-10-CM

## 2018-12-01 DIAGNOSIS — J019 Acute sinusitis, unspecified: Secondary | ICD-10-CM | POA: Diagnosis not present

## 2018-12-01 MED ORDER — BENZONATATE 200 MG PO CAPS: 200.0000 mg | ORAL_CAPSULE | Freq: Three times a day (TID) | ORAL | 0 refills | Status: DC | PRN

## 2018-12-01 MED ORDER — AMOXICILLIN-POT CLAVULANATE 875-125 MG PO TABS: 1.0000 | ORAL_TABLET | Freq: Two times a day (BID) | ORAL | 0 refills | Status: DC

## 2018-12-01 NOTE — ED Triage Notes (Signed)
Pt cc coughing a lot. Pt thinks she has bronchitis. This has going since the 12/ 27/19.

## 2018-12-01 NOTE — ED Provider Notes (Signed)
[place]    CSN: [id number] Arrival date & time: 12/01/18  1525     History   Chief Complaint Chief Complaint  Patient presents with  . Cough    HPI Darlene Boyer is a 78 y.o. female.   She presents today with a 10-day history of cough, chest tightness, wheezing.  She is having trouble sleeping at night, particularly for the last 3 nights, because of cough.  Cough is productive of phlegm.  She also has sinus congestion and drainage.  Little bit of nausea, and has had some posttussive gagging.  No fever, has had some headache in the last 3 to 4 days.  No vomiting, no diarrhea.  Has been a little bit achy, particularly her back, since she has not slept well and has been sleeping sitting up.    HPI  Past Medical History:  Diagnosis Date  . Atrial fibrillation ([place])   . Diabetes mellitus without complication ([place])   . Hypertension   . Thyroid disease     Patient Active Problem List   Diagnosis Date Noted  . Atrial flutter ([place]) 04/09/2016  . Hypertension   . Diabetes mellitus without complication ([place])   . Thyroid disease     Past Surgical History:  Procedure Laterality Date  . ABDOMINAL HYSTERECTOMY    . ANKLE FRACTURE SURGERY Right   . EYE SURGERY     Lens implants  . GALLBLADDER SURGERY    . TUBAL LIGATION        Home Medications    Prior to Admission medications   Medication Sig Start Date End Date Taking? Authorizing Provider  amoxicillin-clavulanate (AUGMENTIN) 875-125 MG tablet Take 1 tablet by mouth every 12 (twelve) hours. 12/01/18   Wynona Luna, MD  benzonatate (TESSALON) 200 MG capsule Take 1 capsule (200 mg total) by mouth 3 (three) times daily as needed for cough. 12/01/18   Wynona Luna, MD  furosemide (LASIX) 20 MG tablet Take 20 mg by mouth.    [provider]  levothyroxine (SYNTHROID, LEVOTHROID) 100 MCG tablet Take 100 mcg by mouth daily before breakfast.    [provider]  metFORMIN (GLUCOPHAGE)  1000 MG tablet Take 1,000 mg by mouth 2 (two) times daily with a meal.    [provider]  metoprolol succinate (TOPROL-XL) 100 MG 24 hr tablet Take 100 mg by mouth daily. 04/04/16   [provider]  XARELTO 20 MG TABS tablet TAKE 1 TABLET BY MOUTH DAILY WITH SUPPER. 03/05/18   Minus Breeding, MD  XARELTO 20 MG TABS tablet TAKE 1 TABLET BY MOUTH DAILY WITH SUPPER. 08/22/18   Minus Breeding, MD    Family History Family History  Problem Relation Age of Onset  . Emphysema Father   . Lung cancer Mother     Social History Social History   Tobacco Use  . Smoking status: Never Smoker  . Smokeless tobacco: Current User    Types: Snuff  . Tobacco comment: "every now and then"  Substance Use Topics  . Alcohol use: No    Alcohol/week: 0.0 standard drinks  . Drug use: No     Allergies   Patient has no known allergies.   Review of Systems Review of Systems  All other systems reviewed and are negative.    Physical Exam Triage Vital Signs ED Triage Vitals  Enc Vitals Group     BP 12/01/18 1553 (!) 148/69     Pulse Rate 12/01/18 1553 68  Resp 12/01/18 1553 18     Temp 12/01/18 1553 98.4 F (36.9 C)     Temp src --      SpO2 12/01/18 1553 100 %     Weight 12/01/18 1551 240 lb (108.9 kg)     Height --      Pain Score 12/01/18 1551 6     Pain Loc --    Updated Vital Signs BP (!) 148/69 (BP Location: Right Arm)   Pulse 68   Temp 98.4 F (36.9 C)   Resp 18   Wt 108.9 kg   SpO2 100%   BMI 41.20 kg/m   Physical Exam Vitals signs and nursing note reviewed.  Constitutional:      General: She is not in acute distress.    Comments: Alert, nicely groomed  HENT:     Head: Atraumatic.     Ears:     Comments: Bilateral TMs are dull, right TM is red Marked nasal congestion bilaterally with mucopurulent material present Posterior pharynx is difficult to visualize    Mouth/Throat:     Mouth: Mucous membranes are dry.  Eyes:     Comments: Conjugate  gaze, no eye redness/drainage  Neck:     Musculoskeletal: Neck supple.  Cardiovascular:     Rate and Rhythm: Normal rate and regular rhythm.  Pulmonary:     Effort: No respiratory distress.     Breath sounds: No wheezing or rales.     Comments: Coarse but symmetric breath sounds throughout Abdominal:     General: There is no distension.  Musculoskeletal: Normal range of motion.     Comments: No leg swelling  Skin:    General: Skin is warm and dry.     Comments: No cyanosis  Neurological:     Mental Status: She is alert and oriented to person, place, and time.       Final Clinical Impressions(s) / UC Diagnoses   Final diagnoses:  Acute sinusitis with symptoms > 10 days  Acute bronchitis with bronchospasm     Discharge Instructions     No danger signs on exam today.  Prescriptions for amoxicillin/clavulanate (antibiotic) and benzonatate (for cough) were sent to the pharmacy.  Anticipate gradual improvement in cough and sinus drainage over the next several days.  Cough may take a couple weeks to subside.  Push fluids and rest.  Recheck for new fever >100.5, increasing phlegm production/nasal discharge, or if not starting to improve in a few days.      ED Prescriptions    Medication Sig Dispense Auth. Provider   amoxicillin-clavulanate (AUGMENTIN) 875-125 MG tablet Take 1 tablet by mouth every 12 (twelve) hours. 14 tablet Wynona Luna, MD   benzonatate (TESSALON) 200 MG capsule Take 1 capsule (200 mg total) by mouth 3 (three) times daily as needed for cough. 30 capsule Wynona Luna, MD       Wynona Luna, MD 12/02/18 1256

## 2018-12-01 NOTE — Discharge Instructions (Signed)
No danger signs on exam today.  Prescriptions for amoxicillin/clavulanate (antibiotic) and benzonatate (for cough) were sent to the pharmacy.  Anticipate gradual improvement in cough and sinus drainage over the next several days.  Cough may take a couple weeks to subside.  Push fluids and rest.  Recheck for new fever >100.5, increasing phlegm production/nasal discharge, or if not starting to improve in a few days.

## 2018-12-12 ENCOUNTER — Other Ambulatory Visit: Payer: Self-pay | Admitting: Cardiology

## 2018-12-12 DIAGNOSIS — I4892 Unspecified atrial flutter: Secondary | ICD-10-CM

## 2018-12-12 NOTE — Telephone Encounter (Signed)
Called pt to let them know that I will grant a refill of 30 days of xarelto and order labs and if labs are not done by the next refill request then it will be denied.

## 2018-12-17 ENCOUNTER — Other Ambulatory Visit: Payer: Self-pay

## 2018-12-17 DIAGNOSIS — I4892 Unspecified atrial flutter: Secondary | ICD-10-CM

## 2018-12-18 LAB — COMPREHENSIVE METABOLIC PANEL
A/G RATIO: 1.9 (ref 1.2–2.2)
ALBUMIN: 4.1 g/dL (ref 3.7–4.7)
ALT: 9 IU/L (ref 0–32)
AST: 14 IU/L (ref 0–40)
Alkaline Phosphatase: 79 IU/L (ref 39–117)
BILIRUBIN TOTAL: 0.3 mg/dL (ref 0.0–1.2)
BUN / CREAT RATIO: 16 (ref 12–28)
BUN: 15 mg/dL (ref 8–27)
CALCIUM: 8.8 mg/dL (ref 8.7–10.3)
CHLORIDE: 103 mmol/L (ref 96–106)
CO2: 28 mmol/L (ref 20–29)
Creatinine, Ser: 0.93 mg/dL (ref 0.57–1.00)
GFR calc Af Amer: 69 mL/min/{1.73_m2} (ref >59)
GFR, EST NON AFRICAN AMERICAN: 60 mL/min/{1.73_m2} (ref >59)
GLOBULIN, TOTAL: 2.2 g/dL (ref 1.5–4.5)
Glucose: 165 mg/dL — ABNORMAL HIGH (ref 65–99)
POTASSIUM: 5.4 mmol/L — AB (ref 3.5–5.2)
Sodium: 142 mmol/L (ref 134–144)
TOTAL PROTEIN: 6.3 g/dL (ref 6.0–8.5)

## 2018-12-22 ENCOUNTER — Telehealth: Payer: Self-pay | Admitting: *Deleted

## 2018-12-22 DIAGNOSIS — Z79899 Other long term (current) drug therapy: Secondary | ICD-10-CM

## 2018-12-22 NOTE — Telephone Encounter (Signed)
spoke with pt about her blood work, pt stated she is taking Losartan 100 mg daily, Losartan is not on pt medication list, but pt stated she has been taking Losartan for over a year, Losartan updated on pt medication list

## 2018-12-22 NOTE — Telephone Encounter (Signed)
Pt call back stated she actually taking Losartan 50 mg daily

## 2018-12-22 NOTE — Telephone Encounter (Signed)
-----   Message from Minus Breeding, MD sent at 12/18/2018  8:37 PM EST ----- Potassium is slightly elevated.  Please repeat BMET next week.  Call Ms. Clutter with the results and send results to Foye Spurling, MD

## 2018-12-23 NOTE — Telephone Encounter (Signed)
Ask her to look at the name of the prescribing MD.

## 2018-12-26 ENCOUNTER — Telehealth: Payer: Self-pay | Admitting: Cardiology

## 2018-12-26 NOTE — Telephone Encounter (Signed)
  Patient is returning call from Nya 

## 2018-12-26 NOTE — Telephone Encounter (Signed)
Spoke with pt and pt is aware needs to repeat labs and is doing so next week not sure if any other info was needed will forward to [place] to make sure nothing else was needed.Informed pt that maybe was an old message .Adonis Housekeeper

## 2018-12-26 NOTE — Telephone Encounter (Signed)
New message    Pt returning call about lab results

## 2018-12-30 NOTE — Telephone Encounter (Signed)
Losartan was prescribed by Dr Jeanann Lewandowsky and is not being fill Dr Deneise Lever.

## 2018-12-31 NOTE — Telephone Encounter (Signed)
OK to prescribe Losartan 50 mg po daily with 90 pills and 2 refills.  That should get her to her follow up with me.  She should be seeing me in June.

## 2019-01-01 LAB — BASIC METABOLIC PANEL
BUN/Creatinine Ratio: 11 — ABNORMAL LOW (ref 12–28)
BUN: 11 mg/dL (ref 8–27)
CALCIUM: 8.9 mg/dL (ref 8.7–10.3)
CHLORIDE: 102 mmol/L (ref 96–106)
CO2: 26 mmol/L (ref 20–29)
Creatinine, Ser: 1.04 mg/dL — ABNORMAL HIGH (ref 0.57–1.00)
GFR calc non Af Amer: 52 mL/min/{1.73_m2} — ABNORMAL LOW (ref >59)
GFR, EST AFRICAN AMERICAN: 60 mL/min/{1.73_m2} (ref >59)
GLUCOSE: 166 mg/dL — AB (ref 65–99)
POTASSIUM: 4.7 mmol/L (ref 3.5–5.2)
Sodium: 141 mmol/L (ref 134–144)

## 2019-01-14 ENCOUNTER — Encounter: Payer: Self-pay | Admitting: *Deleted

## 2019-01-14 ENCOUNTER — Other Ambulatory Visit: Payer: Self-pay | Admitting: Cardiology

## 2019-01-14 NOTE — Telephone Encounter (Signed)
Pt is good for the refill sent

## 2019-02-23 ENCOUNTER — Other Ambulatory Visit: Payer: Self-pay | Admitting: Cardiology

## 2019-02-23 NOTE — Telephone Encounter (Signed)
Age 78, weight 109kg, SCr 1.04 on 12/31/18, CrCl 46ml/min.

## 2019-07-13 NOTE — Progress Notes (Signed)
Cardiology Office Note   Date:  07/14/2019   ID:  Darlene Boyer, DOB 09/18/1941, MRN [id number]  PCP:  Calvert Cantor, MD  Cardiologist:   Minus Breeding, MD   Chief Complaint  Patient presents with  . Atrial Fibrillation      History of Present Illness: Darlene Boyer is a 78 y.o. female who presents for evaluation of atrial fibrillation. She was noted to be in fibrillation at previous visit.  I put a monitor on her.  This demonstrated atrial flutter with variable conduction with controlled response for the most part and ventricular ectopy at times in a bigeminal pattern. Bradycardic events are most pronounced during sleeping. She was started on anticoagulation.  She returns for follow up.    Since I last saw her she has done well. The patient denies any new symptoms such as chest discomfort, neck or arm discomfort. There has been no new shortness of breath, PND or orthopnea. There have been no reported palpitations, presyncope or syncope.    Past Medical History:  Diagnosis Date  . Atrial fibrillation ([place])   . Diabetes mellitus without complication ([place])   . Hypertension   . Thyroid disease     Past Surgical History:  Procedure Laterality Date  . ABDOMINAL HYSTERECTOMY    . ANKLE FRACTURE SURGERY Right   . EYE SURGERY     Lens implants  . GALLBLADDER SURGERY    . TUBAL LIGATION       Current Outpatient Medications  Medication Sig Dispense Refill  . amoxicillin-clavulanate (AUGMENTIN) 875-125 MG tablet Take 1 tablet by mouth every 12 (twelve) hours. 14 tablet 0  . benzonatate (TESSALON) 200 MG capsule Take 1 capsule (200 mg total) by mouth 3 (three) times daily as needed for cough. 30 capsule 0  . furosemide (LASIX) 20 MG tablet Take 20 mg by mouth.    . levothyroxine (SYNTHROID, LEVOTHROID) 100 MCG tablet Take 100 mcg by mouth daily before breakfast.    . losartan (COZAAR) 50 MG tablet Take 50 mg by mouth daily.    . metFORMIN (GLUCOPHAGE) 1000 MG  tablet Take 1,000 mg by mouth 2 (two) times daily with a meal.    . metoprolol succinate (TOPROL-XL) 100 MG 24 hr tablet Take 100 mg by mouth daily.  5  . XARELTO 20 MG TABS tablet TAKE 1 TABLET BY MOUTH DAILY WITH SUPPER. 30 tablet 5   No current facility-administered medications for this visit.     Allergies:   Patient has no known allergies.   ROS:  Please see the history of present illness.   Otherwise, review of systems are positive for right knee pain.   PHYSICAL EXAM: VS:  BP 138/82 (BP Location: Left Arm, Patient Position: Sitting, Cuff Size: Large)   Pulse 71   Temp 97.6 F (36.4 C)   Ht 5\' 2"  (1.575 m)   Wt 244 lb (110.7 kg)   BMI 44.63 kg/m  , BMI Body mass index is 44.63 kg/m.  GENERAL:  Well appearing NECK:  No jugular venous distention, waveform within normal limits, carotid upstroke brisk and symmetric, no bruits, no thyromegaly LUNGS:  Clear to auscultation bilaterally CHEST:  Unremarkable HEART:  PMI not displaced or sustained,S1 and S2 within normal limits, no S3, no clicks, no rubs, no murmurs ABD:  Flat, positive bowel sounds normal in frequency in pitch, no bruits, no rebound, no guarding, no midline pulsatile mass, no hepatomegaly, no splenomegaly EXT:  2 plus pulses  throughout, no edema, no cyanosis no clubbing   EKG:  EKG is  ordered today. Atrial fibrillation, rate 71 , axis within normal limits, intervals within normal limits, no acute ST-T wave changes.  Recent Labs: 12/17/2018: ALT 9 12/31/2018: BUN 11; Creatinine, Ser 1.04; Potassium 4.7; Sodium 141    Lipid Panel No results found for: CHOL, TRIG, HDL, CHOLHDL, VLDL, LDLCALC, LDLDIRECT    Wt Readings from Last 3 Encounters:  07/14/19 244 lb (110.7 kg)  12/01/18 240 lb (108.9 kg)  05/06/18 245 lb 9.6 oz (111.4 kg)      Other studies Reviewed: Additional studies/ records that were reviewed today include:  None3 Review of the above records demonstrates:      ASSESSMENT AND PLAN:  ATRIAL  FIB/FLUTTER:   Darlene Boyer has a CHA2DS2 - VASc score of 4 with a risk of stroke of 4%.  She tolerates rate control and anticoagulation.  No change in therapy.  HTN:  The blood pressure is at target.  No change in therapy.   DM:  A1C is elevated but she is apparently had an increased dose of Glucophage.  Her A1c was 8.7.  I will defer to Calvert Cantor, MD   OVERWEIGH: We talked about this at length.  She has her diet and I recommended the Mediterranean diet.  Current medicines are reviewed at length with the patient today.  The patient does not have concerns regarding medicines.  The following changes have been made:  None  Labs/ tests ordered today include: None  Orders Placed This Encounter  Procedures  . EKG 12-Lead     Disposition:   FU with me in  12  months.     Signed, Minus Breeding, MD  07/14/2019 6:02 PM    [facility]

## 2019-07-14 ENCOUNTER — Ambulatory Visit (INDEPENDENT_AMBULATORY_CARE_PROVIDER_SITE_OTHER): Payer: Medicare Other | Admitting: Cardiology

## 2019-07-14 ENCOUNTER — Other Ambulatory Visit: Payer: Self-pay

## 2019-07-14 ENCOUNTER — Encounter: Payer: Self-pay | Admitting: Cardiology

## 2019-07-14 VITALS — BP 138/82 | HR 71 | Temp 97.6°F | Ht 62.0 in | Wt 244.0 lb

## 2019-07-14 DIAGNOSIS — I4892 Unspecified atrial flutter: Secondary | ICD-10-CM | POA: Diagnosis not present

## 2019-07-14 DIAGNOSIS — I10 Essential (primary) hypertension: Secondary | ICD-10-CM

## 2019-07-14 NOTE — Patient Instructions (Addendum)

## 2019-08-20 ENCOUNTER — Other Ambulatory Visit: Payer: Self-pay | Admitting: Cardiology

## 2019-08-20 NOTE — Telephone Encounter (Signed)
18f 110.7kg Scr 1.04 12/31/18 ccr 80.78mlmin Lovw/hochrein 07/14/19

## 2019-09-10 ENCOUNTER — Other Ambulatory Visit: Payer: Self-pay | Admitting: Family Medicine

## 2019-09-10 DIAGNOSIS — E2839 Other primary ovarian failure: Secondary | ICD-10-CM

## 2019-10-31 ENCOUNTER — Other Ambulatory Visit: Payer: Self-pay

## 2019-10-31 ENCOUNTER — Inpatient Hospital Stay (HOSPITAL_COMMUNITY)
Admission: EM | Admit: 2019-10-31 | Discharge: 2019-11-03 | DRG: 243 | Disposition: A | Discharge status: Home / Self Care | Payer: Medicare Other | Attending: Internal Medicine | Admitting: Internal Medicine

## 2019-10-31 ENCOUNTER — Emergency Department (HOSPITAL_COMMUNITY): Payer: Medicare Other

## 2019-10-31 ENCOUNTER — Encounter (HOSPITAL_COMMUNITY): Payer: Self-pay

## 2019-10-31 DIAGNOSIS — I340 Nonrheumatic mitral (valve) insufficiency: Secondary | ICD-10-CM | POA: Diagnosis not present

## 2019-10-31 DIAGNOSIS — I495 Sick sinus syndrome: Secondary | ICD-10-CM

## 2019-10-31 DIAGNOSIS — I440 Atrioventricular block, first degree: Secondary | ICD-10-CM | POA: Diagnosis present

## 2019-10-31 DIAGNOSIS — R001 Bradycardia, unspecified: Secondary | ICD-10-CM | POA: Diagnosis present

## 2019-10-31 DIAGNOSIS — I498 Other specified cardiac arrhythmias: Secondary | ICD-10-CM | POA: Diagnosis present

## 2019-10-31 DIAGNOSIS — Z90710 Acquired absence of both cervix and uterus: Secondary | ICD-10-CM | POA: Diagnosis not present

## 2019-10-31 DIAGNOSIS — I493 Ventricular premature depolarization: Secondary | ICD-10-CM | POA: Diagnosis present

## 2019-10-31 DIAGNOSIS — E109 Type 1 diabetes mellitus without complications: Secondary | ICD-10-CM | POA: Diagnosis present

## 2019-10-31 DIAGNOSIS — I110 Hypertensive heart disease with heart failure: Secondary | ICD-10-CM | POA: Diagnosis present

## 2019-10-31 DIAGNOSIS — Z20828 Contact with and (suspected) exposure to other viral communicable diseases: Secondary | ICD-10-CM | POA: Diagnosis present

## 2019-10-31 DIAGNOSIS — Z7984 Long term (current) use of oral hypoglycemic drugs: Secondary | ICD-10-CM | POA: Diagnosis not present

## 2019-10-31 DIAGNOSIS — Z79890 Hormone replacement therapy (postmenopausal): Secondary | ICD-10-CM

## 2019-10-31 DIAGNOSIS — I272 Pulmonary hypertension, unspecified: Secondary | ICD-10-CM | POA: Diagnosis present

## 2019-10-31 DIAGNOSIS — I4811 Longstanding persistent atrial fibrillation: Secondary | ICD-10-CM | POA: Diagnosis not present

## 2019-10-31 DIAGNOSIS — I5032 Chronic diastolic (congestive) heart failure: Secondary | ICD-10-CM | POA: Diagnosis present

## 2019-10-31 DIAGNOSIS — I071 Rheumatic tricuspid insufficiency: Secondary | ICD-10-CM | POA: Diagnosis present

## 2019-10-31 DIAGNOSIS — Z959 Presence of cardiac and vascular implant and graft, unspecified: Secondary | ICD-10-CM

## 2019-10-31 DIAGNOSIS — Z7901 Long term (current) use of anticoagulants: Secondary | ICD-10-CM | POA: Diagnosis not present

## 2019-10-31 DIAGNOSIS — R0609 Other forms of dyspnea: Secondary | ICD-10-CM

## 2019-10-31 DIAGNOSIS — Z825 Family history of asthma and other chronic lower respiratory diseases: Secondary | ICD-10-CM

## 2019-10-31 DIAGNOSIS — Z801 Family history of malignant neoplasm of trachea, bronchus and lung: Secondary | ICD-10-CM

## 2019-10-31 DIAGNOSIS — I4819 Other persistent atrial fibrillation: Secondary | ICD-10-CM | POA: Diagnosis present

## 2019-10-31 DIAGNOSIS — E039 Hypothyroidism, unspecified: Secondary | ICD-10-CM | POA: Diagnosis present

## 2019-10-31 DIAGNOSIS — Z79899 Other long term (current) drug therapy: Secondary | ICD-10-CM | POA: Diagnosis not present

## 2019-10-31 DIAGNOSIS — Z961 Presence of intraocular lens: Secondary | ICD-10-CM | POA: Diagnosis present

## 2019-10-31 DIAGNOSIS — T501X6A Underdosing of loop [high-ceiling] diuretics, initial encounter: Secondary | ICD-10-CM | POA: Diagnosis present

## 2019-10-31 DIAGNOSIS — I4892 Unspecified atrial flutter: Secondary | ICD-10-CM | POA: Diagnosis present

## 2019-10-31 DIAGNOSIS — Y92009 Unspecified place in unspecified non-institutional (private) residence as the place of occurrence of the external cause: Secondary | ICD-10-CM | POA: Diagnosis not present

## 2019-10-31 DIAGNOSIS — I361 Nonrheumatic tricuspid (valve) insufficiency: Secondary | ICD-10-CM | POA: Diagnosis not present

## 2019-10-31 LAB — BASIC METABOLIC PANEL
Anion gap: 12 (ref 5–15)
BUN: 27 mg/dL — ABNORMAL HIGH (ref 8–23)
CO2: 24 mmol/L (ref 22–32)
Calcium: 9 mg/dL (ref 8.9–10.3)
Chloride: 105 mmol/L (ref 98–111)
Creatinine, Ser: 1.16 mg/dL — ABNORMAL HIGH (ref 0.44–1.00)
GFR calc Af Amer: 53 mL/min — ABNORMAL LOW (ref >60)
GFR calc non Af Amer: 46 mL/min — ABNORMAL LOW (ref >60)
Glucose, Bld: 203 mg/dL — ABNORMAL HIGH (ref 70–99)
Potassium: 3.3 mmol/L — ABNORMAL LOW (ref 3.5–5.1)
Sodium: 141 mmol/L (ref 135–145)

## 2019-10-31 LAB — GLUCOSE, CAPILLARY: Glucose-Capillary: 251 mg/dL — ABNORMAL HIGH (ref 70–99)

## 2019-10-31 LAB — CBC
HCT: 32.5 % — ABNORMAL LOW (ref 36.0–46.0)
Hemoglobin: 10.3 g/dL — ABNORMAL LOW (ref 12.0–15.0)
MCH: 30.7 pg (ref 26.0–34.0)
MCHC: 31.7 g/dL (ref 30.0–36.0)
MCV: 96.7 fL (ref 80.0–100.0)
Platelets: 238 10*3/uL (ref 150–400)
RBC: 3.36 MIL/uL — ABNORMAL LOW (ref 3.87–5.11)
RDW: 15.7 % — ABNORMAL HIGH (ref 11.5–15.5)
WBC: 7.4 10*3/uL (ref 4.0–10.5)
nRBC: 0 % (ref 0.0–0.2)

## 2019-10-31 LAB — TROPONIN I (HIGH SENSITIVITY)
Troponin I (High Sensitivity): 17 ng/L (ref <18)
Troponin I (High Sensitivity): 19 ng/L — ABNORMAL HIGH (ref <18)

## 2019-10-31 LAB — POC SARS CORONAVIRUS 2 AG -  ED: SARS Coronavirus 2 Ag: NEGATIVE

## 2019-10-31 MED ORDER — SODIUM CHLORIDE 0.9% FLUSH: 3.0000 mL | Freq: Once | INTRAVENOUS | Status: DC

## 2019-10-31 MED ORDER — LEVOTHYROXINE SODIUM 100 MCG PO TABS
100.0000 ug | ORAL_TABLET | Freq: Every day | ORAL | Status: DC
  Administered 2019-11-01 – 2019-11-03 (×3): 100 ug via ORAL
  Filled 2019-10-31 (×3): qty 1

## 2019-10-31 MED ORDER — FUROSEMIDE 20 MG PO TABS
20.0000 mg | ORAL_TABLET | Freq: Every day | ORAL | Status: DC
  Administered 2019-11-01 – 2019-11-03 (×3): 20 mg via ORAL
  Filled 2019-10-31 (×3): qty 1

## 2019-10-31 MED ORDER — ACETAMINOPHEN 325 MG PO TABS: 650.0000 mg | ORAL_TABLET | ORAL | Status: DC | PRN

## 2019-10-31 MED ORDER — ONDANSETRON HCL 4 MG/2ML IJ SOLN: 4.0000 mg | Freq: Four times a day (QID) | INTRAMUSCULAR | Status: DC | PRN

## 2019-10-31 MED ORDER — LOSARTAN POTASSIUM 50 MG PO TABS
50.0000 mg | ORAL_TABLET | Freq: Every day | ORAL | Status: DC
  Administered 2019-11-01 – 2019-11-03 (×3): 50 mg via ORAL
  Filled 2019-10-31 (×3): qty 1

## 2019-10-31 NOTE — ED Provider Notes (Signed)
[place] DEPT Provider Note   CSN: [id number] Arrival date & time: 10/31/19  1057     History   Chief Complaint Chief Complaint  Patient presents with  . Shortness of Breath    HPI Darlene Boyer is a 78 y.o. female with a history of DM, HTN, and atrial fibrillation/flutter on xarelto who presents to the ED with complaints of intermittent shortness of breath for the past 1 week.  Patient states that she has dyspnea on exertion when she is walking for extended periods of time or climbing flights of stairs.  Dyspnea is associated with some pressure to the area under her right breast at times as well as palpitations.  Her symptoms are alleviated with rest.  No other alleviating or aggravating factors.  Denies orthopnea, fever, cough, lightheadedness, dizziness, syncope, leg pain/swelling, hemoptysis, recent surgery/trauma,  hormone use, personal hx of cancer, or hx of DVT/PE. She did recently drive to [place] 5 hours. She has been compliant with her xarelto. She denies hx of CAD, has not had prior stress test/cath, follows with cardiologist Dr. Percival Spanish. Denies nausea, vomiting, or diaphoresis.         HPI  Past Medical History:  Diagnosis Date  . Atrial fibrillation ([place])   . Diabetes mellitus without complication ([place])   . Hypertension   . Thyroid disease     Patient Active Problem List   Diagnosis Date Noted  . Atrial flutter ([place]) 04/09/2016  . Hypertension   . Diabetes mellitus without complication ([place])   . Thyroid disease     Past Surgical History:  Procedure Laterality Date  . ABDOMINAL HYSTERECTOMY    . ANKLE FRACTURE SURGERY Right   . EYE SURGERY     Lens implants  . GALLBLADDER SURGERY    . TUBAL LIGATION       OB History   No obstetric history on file.      Home Medications    Prior to Admission medications   Medication Sig Start Date End Date Taking? Authorizing Provider  amoxicillin-clavulanate (AUGMENTIN)  875-125 MG tablet Take 1 tablet by mouth every 12 (twelve) hours. 12/01/18   Wynona Luna, MD  benzonatate (TESSALON) 200 MG capsule Take 1 capsule (200 mg total) by mouth 3 (three) times daily as needed for cough. 12/01/18   Wynona Luna, MD  furosemide (LASIX) 20 MG tablet Take 20 mg by mouth.    [provider]  levothyroxine (SYNTHROID, LEVOTHROID) 100 MCG tablet Take 100 mcg by mouth daily before breakfast.    [provider]  losartan (COZAAR) 50 MG tablet Take 50 mg by mouth daily.    [provider]  metFORMIN (GLUCOPHAGE) 1000 MG tablet Take 1,000 mg by mouth 2 (two) times daily with a meal.    [provider]  metoprolol succinate (TOPROL-XL) 100 MG 24 hr tablet Take 100 mg by mouth daily. 04/04/16   [provider]  XARELTO 20 MG TABS tablet TAKE 1 TABLET BY MOUTH DAILY WITH SUPPER 08/20/19   Minus Breeding, MD    Family History Family History  Problem Relation Age of Onset  . Emphysema Father   . Lung cancer Mother     Social History Social History   Tobacco Use  . Smoking status: Never Smoker  . Smokeless tobacco: Current User    Types: Snuff  . Tobacco comment: "every now and then"  Substance Use Topics  . Alcohol use: No    Alcohol/week: 0.0 standard  drinks  . Drug use: No     Allergies   Patient has no known allergies.   Review of Systems Review of Systems  Constitutional: Negative for chills, diaphoresis and fever.  HENT: Negative for congestion, ear pain and sore throat.   Respiratory: Positive for shortness of breath. Negative for cough and wheezing.        Negative for orthopnea.   Cardiovascular: Positive for palpitations. Negative for leg swelling.       + for right chest pressure with dyspnea.  Gastrointestinal: Negative for abdominal pain, nausea and vomiting.  Musculoskeletal: Negative for myalgias.  Neurological: Negative for dizziness, syncope, speech difficulty, light-headedness and  headaches.  All other systems reviewed and are negative.    Physical Exam Updated Vital Signs BP (!) 155/62   Pulse (!) 41   Temp 97.9 F (36.6 C) (Oral)   Resp 16   SpO2 97%   Physical Exam Vitals signs and nursing note reviewed.  Constitutional:      General: She is not in acute distress.    Appearance: She is well-developed. She is not toxic-appearing.  HENT:     Head: Normocephalic and atraumatic.  Eyes:     General:        Right eye: No discharge.        Left eye: No discharge.     Conjunctiva/sclera: Conjunctivae normal.  Neck:     Musculoskeletal: Neck supple.  Cardiovascular:     Rate and Rhythm: Bradycardia present.     Comments: 2+ symmetric radial pulses. Pulmonary:     Effort: Pulmonary effort is normal. No respiratory distress.     Breath sounds: Normal breath sounds. No wheezing, rhonchi or rales.  Chest:     Chest wall: No tenderness.  Abdominal:     General: There is no distension.     Palpations: Abdomen is soft.     Tenderness: There is no abdominal tenderness.  Musculoskeletal:     Right lower leg: No edema.     Left lower leg: No edema.  Skin:    General: Skin is warm and dry.     Findings: No rash.  Neurological:     General: No focal deficit present.     Mental Status: She is alert.     Comments: Clear speech.   Psychiatric:        Behavior: Behavior normal.    ED Treatments / Results  Labs (all labs ordered are listed, but only abnormal results are displayed) Labs Reviewed  BASIC METABOLIC PANEL - Abnormal; Notable for the following components:      Result Value   Potassium 3.3 (*)    Glucose, Bld 203 (*)    BUN 27 (*)    Creatinine, Ser 1.16 (*)    GFR calc non Af Amer 46 (*)    GFR calc Af Amer 53 (*)    All other components within normal limits  CBC - Abnormal; Notable for the following components:   RBC 3.36 (*)    Hemoglobin 10.3 (*)    HCT 32.5 (*)    RDW 15.7 (*)    All other components within normal limits   TROPONIN I (HIGH SENSITIVITY)  TROPONIN I (HIGH SENSITIVITY)    EKG EKG Interpretation  Date/Time:  Saturday October 31 2019 11:14:53 EST Ventricular Rate:  51 PR Interval:    QRS Duration: 88 QT Interval:  430 QTC Calculation: 396 R Axis:   40 Text Interpretation: Sinus bradycardia with 1st  degree A-V block Nonspecific T wave abnormality Abnormal ECG No significant change since last tracing Confirmed by Wandra Arthurs [id number]) on 10/31/2019 5:00:59 PM    Radiology Dg Chest 2 View  Result Date: 10/31/2019 CLINICAL DATA:  Right sided lower rib pain. EXAM: CHEST - 2 VIEW COMPARISON:  05/12/2015 FINDINGS: The heart size and mediastinal contours are within normal limits. Both lungs are clear. The visualized skeletal structures are unremarkable. IMPRESSION: No active cardiopulmonary disease. Electronically Signed   By: Kerby Moors M.D.   On: 10/31/2019 12:13    Procedures .Critical Care Performed by: Amaryllis Dyke, PA-C Authorized by: Amaryllis Dyke, PA-C     (including critical care time)  CRITICAL CARE Performed by: Kennith Maes   Total critical care time: 30 minutes  Critical care time was exclusive of separately billable procedures and treating other patients.  Critical care was necessary to treat or prevent imminent or life-threatening deterioration.  Critical care was time spent personally by me on the following activities: development of treatment plan with patient and/or surrogate as well as nursing, discussions with consultants, evaluation of patient's response to treatment, examination of patient, obtaining history from patient or surrogate, ordering and performing treatments and interventions, ordering and review of laboratory studies, ordering and review of radiographic studies, pulse oximetry and re-evaluation of patient's condition.   Medications Ordered in ED Medications  sodium chloride flush (NS) 0.9 % injection 3 mL (0 mLs  Intravenous Hold 10/31/19 1636)     Initial Impression / Assessment and Plan / ED Course  I have reviewed the triage vital signs and the nursing notes.  Pertinent labs & imaging results that were available during my care of the patient were reviewed by me and considered in my medical decision making (see chart for details).   Patient presents to the emergency department with dyspnea on exertion over the past 1 week.  She is nontoxic-appearing, resting comfortably, vitals notable for bradycardia with rates into the 30s.  She otherwise has a fairly benign physical exam.  She is not in respiratory distress.  Labs per triage have been reviewed-anemia similar to prior, mildly uptrending creatinine, hyperglycemia without acidosis or anion gap elevation.  Troponin 17 with repeat of 19.  Initial EKG with sinus bradycardia and first-degree AV block without significant change from prior, repeat EKG reveals junctional rhythm.  Concern for symptomatic bradycardia.  Will discuss with cardiology.    8:10: CONSULT: Discussed with Dr. Charissa Bash with cardiology- will admit to [facility] cardiology service. We will place bed request.   Findings and plan of care discussed with supervising physician Dr. Darl Householder who has evaluated patient & is in agreement.   COVID testing negative.   Darlene Boyer was evaluated in Emergency Department on 10/31/2019 for the symptoms described in the history of present illness. He/she was evaluated in the context of the global COVID-19 pandemic, which necessitated consideration that the patient might be at risk for infection with the SARS-CoV-2 virus that causes COVID-19. Institutional protocols and algorithms that pertain to the evaluation of patients at risk for COVID-19 are in a state of rapid change based on information released by regulatory bodies including the CDC and federal and state organizations. These policies and algorithms were followed during the patient's care in the ED.   Final Clinical Impressions(s) / ED Diagnoses   Final diagnoses:  Symptomatic bradycardia  Junctional rhythm    ED Discharge Orders    None       Avianah Pellman, Aldona Bar  R, PA-C 10/31/19 1919    Drenda Freeze, MD 10/31/19 [id number]

## 2019-10-31 NOTE — H&P (Signed)
Cardiology History & Physical    Patient ID: Darlene Boyer MRN: [id number]; DOB: 1941-02-24   Admission date: 10/31/2019  Primary Care Provider: Calvert Cantor, MD Primary Cardiologist: No primary care provider on file.  Primary Electrophysiologist:  None   Chief Complaint:  Dyspnea  Patient Profile:   Darlene Boyer is a 78 y.o. female with DM, HTN, long-standing persistent AF/AFL who presents with dyspnea.  History of Present Illness:   Patient presented to the ED today for dyspnea.  Reports dyspnea when walking for extended periods or climbing stairs for about the two weeks.  Improves with rest.  She occasionally will have a cramp under her right breast, which seems to also occur with exertion, though not as frequently.  She otherwise denies chest pain, syncope, or presyncope.  Initial EKG in the ED demonstrated sinus rhythm with a heart rate of around 51 bpm and PR of 200 ms.  She then had an EKG with significant sinus bradycardia/arrest with a junctional escape at 33 bpm.  Cardiology was consulted.  VS HR 33, BP 127/90, RR 18, Sat 98% K 3.3, Cr 1.2, Trop 17->19 No imaging  Patient has a history of AF/AFL dating back to at least 2017.  EKGs since that time demonstrate AFL with variable conduction (atypical appearing).  She has been managed successfully with a rate control strategy on metoprolol 100 daily and rivaroxaban.  It appears she has been in AF/AFL continuously since 2017, until this admission.    Holter 02/2016 atrial flutter with variable conduction, frequent PVCs, longest pause 2.6 seconds Most recent echo 02/2016 LVEF 60-65, mild MR, moderate to severe LA enlargement  Heart Pathway Score:      Past Medical History:  Diagnosis Date  . Atrial fibrillation ([place])   . Diabetes mellitus without complication ([place])   . Hypertension   . Thyroid disease     Past Surgical History:  Procedure Laterality Date  . ABDOMINAL HYSTERECTOMY    . ANKLE FRACTURE  SURGERY Right   . EYE SURGERY     Lens implants  . GALLBLADDER SURGERY    . TUBAL LIGATION       Medications Prior to Admission: Prior to Admission medications   Medication Sig Start Date End Date Taking? Authorizing Provider  cetirizine (ZYRTEC) 10 MG chewable tablet Chew 10 mg by mouth daily.   Yes [provider]  dextromethorphan-guaiFENesin (MUCINEX DM) 30-600 MG 12hr tablet Take 1 tablet by mouth 2 (two) times daily.   Yes [provider]  furosemide (LASIX) 20 MG tablet Take 20 mg by mouth.   Yes [provider]  levothyroxine (SYNTHROID, LEVOTHROID) 100 MCG tablet Take 100 mcg by mouth daily before breakfast.   Yes [provider]  losartan (COZAAR) 50 MG tablet Take 50 mg by mouth daily.   Yes [provider]  metFORMIN (GLUCOPHAGE) 1000 MG tablet Take 1,000 mg by mouth 2 (two) times daily with a meal.   Yes [provider]  metoprolol succinate (TOPROL-XL) 100 MG 24 hr tablet Take 100 mg by mouth daily. 04/04/16  Yes [provider]  Multiple Vitamins-Minerals (WOMENS 50+ MULTI VITAMIN/MIN PO) Take 1 tablet by mouth daily.   Yes [provider]  XARELTO 20 MG TABS tablet TAKE 1 TABLET BY MOUTH DAILY WITH SUPPER Patient taking differently: Take 20 mg by mouth daily with supper.  08/20/19  Yes Minus Breeding, MD  amoxicillin-clavulanate (AUGMENTIN) 875-125 MG tablet Take 1 tablet by mouth every 12 (  twelve) hours. Patient not taking: Reported on 10/31/2019 12/01/18   Wynona Luna, MD  benzonatate (TESSALON) 200 MG capsule Take 1 capsule (200 mg total) by mouth 3 (three) times daily as needed for cough. Patient not taking: Reported on 10/31/2019 12/01/18   Wynona Luna, MD  traZODone (DESYREL) 50 MG tablet Take 501 mg by mouth at bedtime as needed for sleep. 10/12/19   [provider]     Allergies:   No Known Allergies  Social History:   Social History   Socioeconomic History  . Marital  status: Married    Spouse name: Not on file  . Number of children: 5  . Years of education: Not on file  . Highest education level: Not on file  Occupational History  . Not on file  Social Needs  . Financial resource strain: Not on file  . Food insecurity    Worry: Not on file    Inability: Not on file  . Transportation needs    Medical: Not on file    Non-medical: Not on file  Tobacco Use  . Smoking status: Never Smoker  . Smokeless tobacco: Current User    Types: Snuff  . Tobacco comment: "every now and then"  Substance and Sexual Activity  . Alcohol use: No    Alcohol/week: 0.0 standard drinks  . Drug use: No  . Sexual activity: Not on file  Lifestyle  . Physical activity    Days per week: Not on file    Minutes per session: Not on file  . Stress: Not on file  Relationships  . Social Herbalist on phone: Not on file    Gets together: Not on file    Attends religious service: Not on file    Active member of club or organization: Not on file    Attends meetings of clubs or organizations: Not on file    Relationship status: Not on file  . Intimate partner violence    Fear of current or ex partner: Not on file    Emotionally abused: Not on file    Physically abused: Not on file    Forced sexual activity: Not on file  Other Topics Concern  . Not on file  Social History Narrative   Lives at home with husband.      Family History:   The patient's family history includes Emphysema in her father; Lung cancer in her mother.    ROS:  Please see the history of present illness.  All other ROS reviewed and negative.     Physical Exam/Data:   Vitals:   10/31/19 2015 10/31/19 2030 10/31/19 2155 10/31/19 2158  BP: 111/87 127/90  (!) 168/44  Pulse: (!) 38 (!) 33 (!) 41 (!) 44  Resp: (!) 21 18 20    Temp:   98.2 F (36.8 C) 98.2 F (36.8 C)  TempSrc:   Oral Oral  SpO2: 99% 98% 99% 98%  Weight:   113.8 kg   Height:   5\' 5"  (1.651 m)    No intake or  output data in the 24 hours ending 10/31/19 2217 Last 3 Weights 10/31/2019 07/14/2019 12/01/2018  Weight (lbs) 250 lb 12.8 oz 244 lb 240 lb  Weight (kg) 113.762 kg 110.678 kg 108.863 kg     Body mass index is 41.74 kg/m.  Wt Readings from Last 3 Encounters:  10/31/19 113.8 kg  07/14/19 110.7 kg  12/01/18 108.9 kg    Physical Exam: General: Well  developed, well nourished, in no acute distress. Head: Normocephalic, atraumatic, sclera non-icteric, no xanthomas, nares are without discharge.  Neck: Negative for carotid bruits. JVD not elevated. Lungs: Clear bilaterally to auscultation without wheezes, rales, or rhonchi. Breathing is unlabored. Heart: RRR with S1 S2. No murmurs, rubs, or gallops appreciated. Abdomen: Soft, non-tender, non-distended with normoactive bowel sounds. No hepatomegaly. No rebound/guarding. No obvious abdominal masses. Msk:  Strength and tone appear normal for age. Extremities: No clubbing or cyanosis. No edema.  Distal pedal pulses are 2+ and equal bilaterally. Neuro: Alert and oriented X 3. No focal deficit. No facial asymmetry. Moves all extremities spontaneously. Psych:  Responds to questions appropriately with a normal affect.    EKG:  The ECG that was done demonstrates junctional rhythm.    Relevant CV Studies: See HPI  Laboratory Data:  High Sensitivity Troponin:   Recent Labs  Lab 10/31/19 1112 10/31/19 1312  TROPONINIHS 17 19*      Cardiac EnzymesNo results for input(s): TROPONINI in the last 168 hours. No results for input(s): TROPIPOC in the last 168 hours.  Chemistry Recent Labs  Lab 10/31/19 1112  NA 141  K 3.3*  CL 105  CO2 24  GLUCOSE 203*  BUN 27*  CREATININE 1.16*  CALCIUM 9.0  GFRNONAA 46*  GFRAA 53*  ANIONGAP 12    No results for input(s): PROT, ALBUMIN, AST, ALT, ALKPHOS, BILITOT in the last 168 hours. Hematology Recent Labs  Lab 10/31/19 1112  WBC 7.4  RBC 3.36*  HGB 10.3*  HCT 32.5*  MCV 96.7  MCH 30.7  MCHC  31.7  RDW 15.7*  PLT 238   BNPNo results for input(s): BNP, PROBNP in the last 168 hours.  DDimer No results for input(s): DDIMER in the last 168 hours.   Radiology/Studies:  Dg Chest 2 View  Result Date: 10/31/2019 CLINICAL DATA:  Right sided lower rib pain. EXAM: CHEST - 2 VIEW COMPARISON:  05/12/2015 FINDINGS: The heart size and mediastinal contours are within normal limits. Both lungs are clear. The visualized skeletal structures are unremarkable. IMPRESSION: No active cardiopulmonary disease. Electronically Signed   By: Kerby Moors M.D.   On: 10/31/2019 12:13    Assessment and Plan   #. Sinus bradycardia Patient presents with a new diagnosis of sinus bradycardia.  It appears that she recently spontaneously converted out of long-standing AFL to sinus rhythm, and now has underlying sinus node disease.  Her initial ECG showed sinus rhythm with a heart rate of 50, but currently she has sinus arrest/severe sinus bradycardia with a junctional escape in the low 30s.  She remains stable and is asymptomatic at rest.  It is unclear if her dyspnea on exertion is related to the bradycardia, but it may be due to chronotropic incompetence.  We will hold metoprolol, though I doubt this alone will fix the problem.  She will probably need a dual-chamber pacemaker.  She is currently stable, so this could be done Monday or Tuesday. -- hold metoprolol -- K 3.3 - will supplement -- will get baseline echo -- likely DC PPM Monday  #. Dyspnea on exertion As discussed above, this may be secondary to bradycardia.  CXR clear, troponin normal, ECG without ischemia.  If symtpoms do not improve with PPM placement/resolution of bradycarida, can consider further evaluation (likely stress test).  #. Atrial fibrillation/flutter Currently in sinus rhythm as discussed above.  Will hold rivaroxaban in anticipation of pacemaker implant (CHADS-VASC=5).  #. HTN SBP 150s-170s.  Continue home losartan 50  mg, lasix 20  mg.  #. Diabetes mellitus Hold metformin  #. Hypothyroidism Will check TSH.  Continue levothyroxine 100 mcg.  Severity of Illness: The appropriate patient status for this patient is INPATIENT. Inpatient status is judged to be reasonable and necessary in order to provide the required intensity of service to ensure the patient's safety. The patient's presenting symptoms, physical exam findings, and initial radiographic and laboratory data in the context of their chronic comorbidities is felt to place them at high risk for further clinical deterioration. Furthermore, it is not anticipated that the patient will be medically stable for discharge from the hospital within 2 midnights of admission. The following factors support the patient status of inpatient.   " The patient's presenting symptoms include dyspnea on exertion. " The worrisome physical exam findings include none. " The initial radiographic and laboratory data are worrisome because of bradycardia. " The chronic co-morbidities include AF/AFL.   * I certify that at the point of admission it is my clinical judgment that the patient will require inpatient hospital care spanning beyond 2 midnights from the point of admission due to high intensity of service, high risk for further deterioration and high frequency of surveillance required.*    For questions or updates, please contact [place] Please consult [email or web address] for contact info under      Signed, Amiel Sharrow S, MD 10/31/2019, 10:17 PM

## 2019-10-31 NOTE — ED Triage Notes (Signed)
Pt states for several weeks, she has been having significant pressure under her right breast after walking. Denies cough.

## 2019-10-31 NOTE — Progress Notes (Signed)
Pt arrived via [place] to [phone].  Pt denies any c/o.  HR 34.  Cardiology service paged to notify of paitent arrival.

## 2019-10-31 NOTE — ED Notes (Signed)
Called to give report nurse will call back. 

## 2019-10-31 NOTE — ED Notes (Signed)
Pt given juice and sandwich.

## 2019-11-01 ENCOUNTER — Inpatient Hospital Stay (HOSPITAL_COMMUNITY): Payer: Medicare Other

## 2019-11-01 DIAGNOSIS — I361 Nonrheumatic tricuspid (valve) insufficiency: Secondary | ICD-10-CM

## 2019-11-01 DIAGNOSIS — I4811 Longstanding persistent atrial fibrillation: Secondary | ICD-10-CM

## 2019-11-01 DIAGNOSIS — I495 Sick sinus syndrome: Principal | ICD-10-CM

## 2019-11-01 DIAGNOSIS — R001 Bradycardia, unspecified: Secondary | ICD-10-CM

## 2019-11-01 DIAGNOSIS — I340 Nonrheumatic mitral (valve) insufficiency: Secondary | ICD-10-CM

## 2019-11-01 LAB — BASIC METABOLIC PANEL
Anion gap: 8 (ref 5–15)
BUN: 24 mg/dL — ABNORMAL HIGH (ref 8–23)
CO2: 23 mmol/L (ref 22–32)
Calcium: 8.9 mg/dL (ref 8.9–10.3)
Chloride: 108 mmol/L (ref 98–111)
Creatinine, Ser: 1.14 mg/dL — ABNORMAL HIGH (ref 0.44–1.00)
GFR calc Af Amer: 54 mL/min — ABNORMAL LOW (ref >60)
GFR calc non Af Amer: 47 mL/min — ABNORMAL LOW (ref >60)
Glucose, Bld: 255 mg/dL — ABNORMAL HIGH (ref 70–99)
Potassium: 3.9 mmol/L (ref 3.5–5.1)
Sodium: 139 mmol/L (ref 135–145)

## 2019-11-01 LAB — SURGICAL PCR SCREEN
MRSA, PCR: NEGATIVE
Staphylococcus aureus: NEGATIVE

## 2019-11-01 LAB — SARS CORONAVIRUS 2 (TAT 6-24 HRS): SARS Coronavirus 2: NEGATIVE

## 2019-11-01 LAB — ECHOCARDIOGRAM COMPLETE
Height: 65 in
Weight: 4000 oz

## 2019-11-01 LAB — GLUCOSE, CAPILLARY: Glucose-Capillary: 257 mg/dL — ABNORMAL HIGH (ref 70–99)

## 2019-11-01 LAB — TSH: TSH: 2.963 u[IU]/mL (ref 0.350–4.500)

## 2019-11-01 LAB — D-DIMER, QUANTITATIVE: D-Dimer, Quant: 1 ug/mL-FEU — ABNORMAL HIGH (ref 0.00–0.50)

## 2019-11-01 LAB — MAGNESIUM: Magnesium: 2.1 mg/dL (ref 1.7–2.4)

## 2019-11-01 MED ORDER — FUROSEMIDE 10 MG/ML IJ SOLN
40.0000 mg | Freq: Once | INTRAMUSCULAR | Status: AC
  Administered 2019-11-01: 15:00:00 40 mg via INTRAVENOUS
  Filled 2019-11-01: qty 4

## 2019-11-01 MED ORDER — CHLORHEXIDINE GLUCONATE 4 % EX LIQD
60.0000 mL | Freq: Once | CUTANEOUS | Status: DC
  Filled 2019-11-01: qty 60

## 2019-11-01 MED ORDER — SODIUM CHLORIDE 0.9 % IV SOLN
INTRAVENOUS | Status: DC
  Administered 2019-11-02: 07:00:00 via INTRAVENOUS

## 2019-11-01 MED ORDER — SODIUM CHLORIDE 0.9 % IV SOLN
80.0000 mg | INTRAVENOUS | Status: AC
  Administered 2019-11-02: 13:00:00 80 mg
  Filled 2019-11-01: qty 2

## 2019-11-01 MED ORDER — POTASSIUM CHLORIDE CRYS ER 20 MEQ PO TBCR
40.0000 meq | EXTENDED_RELEASE_TABLET | Freq: Once | ORAL | Status: AC
  Administered 2019-11-01: 06:00:00 40 meq via ORAL
  Filled 2019-11-01: qty 2

## 2019-11-01 MED ORDER — CEFAZOLIN SODIUM-DEXTROSE 2-4 GM/100ML-% IV SOLN
2.0000 g | INTRAVENOUS | Status: AC
  Administered 2019-11-02: 12:00:00 2 g via INTRAVENOUS
  Filled 2019-11-01: qty 100

## 2019-11-01 MED ORDER — CHLORHEXIDINE GLUCONATE 4 % EX LIQD
60.0000 mL | Freq: Once | CUTANEOUS | Status: AC
  Administered 2019-11-02: 4 via TOPICAL

## 2019-11-01 MED ORDER — IOHEXOL 350 MG/ML SOLN
100.0000 mL | Freq: Once | INTRAVENOUS | Status: AC | PRN
  Administered 2019-11-01: 16:00:00 66 mL via INTRAVENOUS

## 2019-11-01 NOTE — Progress Notes (Signed)
Pt ambulated hallway about 100 ft.  Stated she was slightly SOB.  Her HR was in 60s - 70s while ambulating.  Idolina Primer, RN

## 2019-11-01 NOTE — Consult Note (Signed)
ELECTROPHYSIOLOGY CONSULT NOTE  Patient ID: Darlene Boyer, MRN: [id number], DOB/AGE: Dec 11, 1940 78 y.o. Admit date: 10/31/2019 Date of Consult: 11/01/2019  Primary Physician: Calvert Cantor, MD Primary Cardiologist: [facility] Darlene Boyer is a 78 y.o. female who is being seen today for the evaluation of bradycardia at the request of [facility].   Chief Complaint: dyspnea   HPI Darlene Boyer is a 78 y.o. female admitted yesterday with complaints of dyspnea for the last couple of weeks.  She had been aware of her heart beating hard when she climbs stairs.  She has had no palpitations.  Has chronic lower extremity edema which is unchanged.  No nocturnal dyspnea or orthopnea.  And no chest discomfort.   Hx of atrial fib identified at least 2017 with monitoring at that time demonstrating some rapid rates in the 140 range.  She was treated with metoprolol, rivaroxaban. Interval ECGs 2018, 19, and 8/20 all demonstrated atrial fibrillation.  On arrival yday her ECG showed sinus rhythm.  Echocardiogram 4/17 and repeated today demonstrate   left atrial enlargement with normal left ventricular function and mild pulmonary hypertension       Thromboembolic risk factors ( age -45, HTN-1, DM-1, Gender-1) for a CHADSVASc Score of 5    Past Medical History:  Diagnosis Date  . Atrial fibrillation ([place])   . Diabetes mellitus without complication ([place])   . Hypertension   . Thyroid disease       Surgical History:  Past Surgical History:  Procedure Laterality Date  . ABDOMINAL HYSTERECTOMY    . ANKLE FRACTURE SURGERY Right   . EYE SURGERY     Lens implants  . GALLBLADDER SURGERY    . TUBAL LIGATION       Home Meds: Prior to Admission medications   Medication Sig Start Date End Date Taking? Authorizing Provider  cetirizine (ZYRTEC) 10 MG chewable tablet Chew 10 mg by mouth daily.   Yes [provider]  dextromethorphan-guaiFENesin (MUCINEX DM) 30-600 MG 12hr tablet Take 1  tablet by mouth 2 (two) times daily.   Yes [provider]  furosemide (LASIX) 20 MG tablet Take 20 mg by mouth.   Yes [provider]  levothyroxine (SYNTHROID, LEVOTHROID) 100 MCG tablet Take 100 mcg by mouth daily before breakfast.   Yes [provider]  losartan (COZAAR) 50 MG tablet Take 50 mg by mouth daily.   Yes [provider]  metFORMIN (GLUCOPHAGE) 1000 MG tablet Take 1,000 mg by mouth 2 (two) times daily with a meal.   Yes [provider]  metoprolol succinate (TOPROL-XL) 100 MG 24 hr tablet Take 100 mg by mouth daily. 04/04/16  Yes [provider]  Multiple Vitamins-Minerals (WOMENS 50+ MULTI VITAMIN/MIN PO) Take 1 tablet by mouth daily.   Yes [provider]  XARELTO 20 MG TABS tablet TAKE 1 TABLET BY MOUTH DAILY WITH SUPPER Patient taking differently: Take 20 mg by mouth daily with supper.  08/20/19  Yes Minus Breeding, MD  amoxicillin-clavulanate (AUGMENTIN) 875-125 MG tablet Take 1 tablet by mouth every 12 (twelve) hours. Patient not taking: Reported on 10/31/2019 12/01/18   Wynona Luna, MD  benzonatate (TESSALON) 200 MG capsule Take 1 capsule (200 mg total) by mouth 3 (three) times daily as needed for cough. Patient not taking: Reported on 10/31/2019 12/01/18   Wynona Luna, MD  traZODone (DESYREL) 50 MG tablet Take 501 mg by mouth at bedtime as needed for sleep. 10/12/19   [provider]    Inpatient Medications:  . furosemide  20 mg Oral Daily  . levothyroxine  100 mcg Oral QAC breakfast  . losartan  50 mg Oral Daily  . sodium chloride flush  3 mL Intravenous Once     Allergies: No Known Allergies  Social History   Socioeconomic History  . Marital status: Married    Spouse name: Not on file  . Number of children: 5  . Years of education: Not on file  . Highest education level: Not on file  Occupational History  . Not on file  Social Needs  . Financial resource strain: Not on file   . Food insecurity    Worry: Not on file    Inability: Not on file  . Transportation needs    Medical: Not on file    Non-medical: Not on file  Tobacco Use  . Smoking status: Never Smoker  . Smokeless tobacco: Current User    Types: Snuff  . Tobacco comment: "every now and then"  Substance and Sexual Activity  . Alcohol use: No    Alcohol/week: 0.0 standard drinks  . Drug use: No  . Sexual activity: Not on file  Lifestyle  . Physical activity    Days per week: Not on file    Minutes per session: Not on file  . Stress: Not on file  Relationships  . Social Herbalist on phone: Not on file    Gets together: Not on file    Attends religious service: Not on file    Active member of club or organization: Not on file    Attends meetings of clubs or organizations: Not on file    Relationship status: Not on file  . Intimate partner violence    Fear of current or ex partner: Not on file    Emotionally abused: Not on file    Physically abused: Not on file    Forced sexual activity: Not on file  Other Topics Concern  . Not on file  Social History Narrative   Lives at home with husband.      Family History  Problem Relation Age of Onset  . Emphysema Father   . Lung cancer Mother      ROS:  Please see the history of present illness.     All other systems reviewed and negative.    Physical Exam: Blood pressure (!) 156/49, pulse (!) 35, temperature 98.4 F (36.9 C), temperature source Oral, resp. rate 17, height 5\' 5"  (1.651 m), weight 113.4 kg, SpO2 98 %. General: Well developed, well nourished female in no acute distress. Head: Normocephalic, atraumatic, sclera non-icteric, no xanthomas, nares are without discharge. EENT: normal Lymph Nodes:  none Back: without scoliosis/kyphosis , no CVA tendersness Neck: Negative for carotid bruits. JVD not elevated. Lungs: Clear bilaterally to auscultation without wheezes, rales, or rhonchi. Breathing is unlabored. Heart:  slow but RRR with S1 S2.  2/6 systolic murmur , rubs, or gallops appreciated. Abdomen: Soft, non-tender, non-distended with normoactive bowel sounds. No hepatomegaly. No rebound/guarding. No obvious abdominal masses. Msk:  Strength and tone appear normal for age. Extremities: No clubbing or cyanosis.  1+  edema.  Distal pedal pulses are 2+ and equal bilaterally. Skin: Warm and Dry Neuro: Alert and oriented X 3. CN III-XII intact Grossly normal sensory and motor function . Psych:  Responds to questions appropriately with a normal affect.      Labs: Cardiac Enzymes No results for input(s): CKTOTAL,  CKMB, TROPONINI in the last 72 hours. CBC Lab Results  Component Value Date   WBC 7.4 10/31/2019   HGB 10.3 (L) 10/31/2019   HCT 32.5 (L) 10/31/2019   MCV 96.7 10/31/2019   PLT 238 10/31/2019   PROTIME: No results for input(s): LABPROT, INR in the last 72 hours. Chemistry  Recent Labs  Lab 11/01/19 0608  NA 139  K 3.9  CL 108  CO2 23  BUN 24*  CREATININE 1.14*  CALCIUM 8.9  GLUCOSE 255*   Lipids No results found for: CHOL, HDL, LDLCALC, TRIG BNP No results found for: PROBNP Thyroid Function Tests: Recent Labs    11/01/19 0608  TSH 2.963      Miscellaneous Lab Results  Component Value Date   DDIMER 1.00 (H) 11/01/2019    Radiology/Studies:  Dg Chest 2 View  Result Date: 10/31/2019 CLINICAL DATA:  Right sided lower rib pain. EXAM: CHEST - 2 VIEW COMPARISON:  05/12/2015 FINDINGS: The heart size and mediastinal contours are within normal limits. Both lungs are clear. The visualized skeletal structures are unremarkable. IMPRESSION: No active cardiopulmonary disease. Electronically Signed   By: Kerby Moors M.D.   On: 10/31/2019 12:13    EKG: *12/6  0600  Junctional rhythm @ 30   12.5 1735 junctional with intermittent sinus @ 33  12/5 1114 sinus @ 81 16/09/43  Prominent U waves  Telemetry Personally reviewed  Bigeminy with long QT about 600 msec Echo  12/20 LVEF   Normal Pulm HTN mild  LAE milde  Holter Personally reviewed  4/17  Frq PVC 10% Atrial fib/flutter with rapid and controlled VR   Assessment and Plan:   Sinus node dysfunction with profound daytime bradycardia   Atrial flutter/fib   Hypertension  HFpEF-chronic  An unusual scenario, presumably, (but unknown for sure)\ long standing persistent atrial fibrillation since 2017, having been recorded on all interval tracings until yesterday.  She clearly has severe sinus node dysfunction which is now evident.  Her left atrial size is surprisingly not all that big; hence, there may be some role for efforts to maintain sinus rhythm.  This would require backup bradycardia pacing given her history of rapid atrial fibrillation/flutter that had required beta-blockers to begin with.  Question is begged as to what happened that caused her to revert to sinus rhythm.  Her D-dimer is elevated; she is on anticoagulation.  I cannot imagine how a pulmonary embolism would do this, if anything assoc atrial strain and predilcition with atrial fibrillation  Have discussed the benefits and risks of pacemaker implantation  She understands agrees and is willing to proceed.  Dose of diuretics today.    Virl Axe

## 2019-11-01 NOTE — Progress Notes (Signed)
*  PRELIMINARY RESULTS* Echocardiogram 2D Echocardiogram has been performed.  Darlene Boyer 11/01/2019, 10:50 AM

## 2019-11-01 NOTE — Progress Notes (Signed)
Progress Note  Patient Name: Darlene Boyer Date of Encounter: 11/01/2019  Primary Cardiologist:   Minus Breeding, MD   Subjective   She became SOB and and vague discomfort under her right breast and back starting at Thanksgiving.  She just has not felt right. No acute complaints overnight  Inpatient Medications    Scheduled Meds: . furosemide  20 mg Oral Daily  . levothyroxine  100 mcg Oral QAC breakfast  . losartan  50 mg Oral Daily  . sodium chloride flush  3 mL Intravenous Once   Continuous Infusions:  PRN Meds: acetaminophen, ondansetron (ZOFRAN) IV   Vital Signs    Vitals:   10/31/19 2155 10/31/19 2158 11/01/19 0238 11/01/19 0518  BP:  (!) 168/44 (!) 139/41 (!) 156/49  Pulse: (!) 41 (!) 44  (!) 35  Resp: 20   17  Temp: 98.2 F (36.8 C) 98.2 F (36.8 C)  98.4 F (36.9 C)  TempSrc: Oral Oral  Oral  SpO2: 99% 98%  98%  Weight: 113.8 kg   113.4 kg  Height: 5\' 5"  (1.651 m)       Intake/Output Summary (Last 24 hours) at 11/01/2019 0746 Last data filed at 10/31/2019 2200 Gross per 24 hour  Intake 240 ml  Output -  Net 240 ml   Filed Weights   10/31/19 2155 11/01/19 0518  Weight: 113.8 kg 113.4 kg    Telemetry    PAF likely.  Junctional rhythm, PVCs in a bigeminal pattern.   - Personally Reviewed  ECG    NA - Personally Reviewed  Physical Exam   GEN: No acute distress.   Neck: No  JVD Cardiac: RRR,  murmurs, rubs, or gallops.  Respiratory: Clear  to auscultation bilaterally. GI: Soft, nontender, non-distended  MS:    Mild leg edema; No deformity. Neuro:  Nonfocal  Psych: Normal affect   Labs    Chemistry Recent Labs  Lab 10/31/19 1112 11/01/19 0608  NA 141 139  K 3.3* 3.9  CL 105 108  CO2 24 23  GLUCOSE 203* 255*  BUN 27* 24*  CREATININE 1.16* 1.14*  CALCIUM 9.0 8.9  GFRNONAA 46* 47*  GFRAA 53* 54*  ANIONGAP 12 8     Hematology Recent Labs  Lab 10/31/19 1112  WBC 7.4  RBC 3.36*  HGB 10.3*  HCT 32.5*  MCV 96.7  MCH  30.7  MCHC 31.7  RDW 15.7*  PLT 238    Cardiac EnzymesNo results for input(s): TROPONINI in the last 168 hours. No results for input(s): TROPIPOC in the last 168 hours.   BNPNo results for input(s): BNP, PROBNP in the last 168 hours.   DDimer No results for input(s): DDIMER in the last 168 hours.   Radiology    Dg Chest 2 View  Result Date: 10/31/2019 CLINICAL DATA:  Right sided lower rib pain. EXAM: CHEST - 2 VIEW COMPARISON:  05/12/2015 FINDINGS: The heart size and mediastinal contours are within normal limits. Both lungs are clear. The visualized skeletal structures are unremarkable. IMPRESSION: No active cardiopulmonary disease. Electronically Signed   By: Kerby Moors M.D.   On: 10/31/2019 12:13    Cardiac Studies   Echo pending.   Patient Profile     78 y.o. female with DM, HTN, long-standing persistent AF/AFL who presents with dyspnea.  Assessment & Plan    SINUS BRADYCARDIA:  Bradycardia.  Low dose beta blocker held.    Still bradycardic.  I asked the nurses to ambulate in the  hallway so we could see her heart rate.  However, she is now in the middle of an echo.  We should check this later.   DYSPNEA:  Not a clear etiology.  Question secondary to rate related and I agree that this could be the case.  Echo is pending.   Of note she drove to [place].  I will check  D Dimer.   ATRIAL FIB/FLUTTER:  Anticoagulation on hold pending possible device implant.     HYPOTHYROIDISM:  TSH OK.    LEG SWELLING:  She reports that she was not taking her Lasix. She is on her oral Lasix.  Echo pending.   For questions or updates, please contact [place] Please consult [email or web address] for contact info under Cardiology/STEMI.   Signed, Minus Breeding, MD  11/01/2019, 7:46 AM

## 2019-11-02 ENCOUNTER — Encounter (HOSPITAL_COMMUNITY): Payer: Self-pay | Admitting: Internal Medicine

## 2019-11-02 ENCOUNTER — Inpatient Hospital Stay (HOSPITAL_COMMUNITY)
Admission: EM | Disposition: A | Discharge status: Home / Self Care | Payer: Self-pay | Source: Home / Self Care | Attending: Internal Medicine

## 2019-11-02 HISTORY — PX: PACEMAKER IMPLANT: EP1218

## 2019-11-02 LAB — GLUCOSE, CAPILLARY
Glucose-Capillary: 192 mg/dL — ABNORMAL HIGH (ref 70–99)
Glucose-Capillary: 181 mg/dL — ABNORMAL HIGH (ref 70–99)
Glucose-Capillary: 196 mg/dL — ABNORMAL HIGH (ref 70–99)
Glucose-Capillary: 121 mg/dL — ABNORMAL HIGH (ref 70–99)

## 2019-11-02 SURGERY — PACEMAKER IMPLANT
Anesthesia: LOCAL

## 2019-11-02 MED ORDER — CEFAZOLIN SODIUM-DEXTROSE 2-4 GM/100ML-% IV SOLN
INTRAVENOUS | Status: AC
  Filled 2019-11-02: qty 100

## 2019-11-02 MED ORDER — LIDOCAINE HCL (PF) 1 % IJ SOLN
INTRAMUSCULAR | Status: DC | PRN
  Administered 2019-11-02: 45 mL

## 2019-11-02 MED ORDER — FENTANYL CITRATE (PF) 100 MCG/2ML IJ SOLN
INTRAMUSCULAR | Status: DC | PRN
  Administered 2019-11-02: 25 ug via INTRAVENOUS

## 2019-11-02 MED ORDER — CEFAZOLIN SODIUM-DEXTROSE 1-4 GM/50ML-% IV SOLN
1.0000 g | Freq: Four times a day (QID) | INTRAVENOUS | Status: AC
  Administered 2019-11-02 – 2019-11-03 (×3): 1 g via INTRAVENOUS
  Filled 2019-11-02 (×3): qty 50

## 2019-11-02 MED ORDER — SODIUM CHLORIDE 0.9 % IV SOLN
INTRAVENOUS | Status: AC
  Filled 2019-11-02: qty 2

## 2019-11-02 MED ORDER — ACETAMINOPHEN 325 MG PO TABS
325.0000 mg | ORAL_TABLET | ORAL | Status: DC | PRN
  Administered 2019-11-02 – 2019-11-03 (×2): 650 mg via ORAL
  Filled 2019-11-02 (×2): qty 2

## 2019-11-02 MED ORDER — MIDAZOLAM HCL 5 MG/5ML IJ SOLN
INTRAMUSCULAR | Status: DC | PRN
  Administered 2019-11-02: 1 mg via INTRAVENOUS

## 2019-11-02 MED ORDER — FENTANYL CITRATE (PF) 100 MCG/2ML IJ SOLN
INTRAMUSCULAR | Status: AC
  Filled 2019-11-02: qty 2

## 2019-11-02 MED ORDER — LIDOCAINE HCL 1 % IJ SOLN
INTRAMUSCULAR | Status: AC
  Filled 2019-11-02: qty 20

## 2019-11-02 MED ORDER — MIDAZOLAM HCL 5 MG/5ML IJ SOLN
INTRAMUSCULAR | Status: AC
  Filled 2019-11-02: qty 5

## 2019-11-02 MED ORDER — HEPARIN (PORCINE) IN NACL 1000-0.9 UT/500ML-% IV SOLN
INTRAVENOUS | Status: AC
  Filled 2019-11-02: qty 500

## 2019-11-02 MED ORDER — INSULIN ASPART 100 UNIT/ML ~~LOC~~ SOLN
0.0000 [IU] | Freq: Three times a day (TID) | SUBCUTANEOUS | Status: DC
  Administered 2019-11-02 – 2019-11-03 (×3): 3 [IU] via SUBCUTANEOUS
  Administered 2019-11-03: 08:00:00 2 [IU] via SUBCUTANEOUS

## 2019-11-02 MED ORDER — LIDOCAINE HCL 1 % IJ SOLN
INTRAMUSCULAR | Status: AC
  Filled 2019-11-02: qty 40

## 2019-11-02 MED ORDER — INSULIN ASPART 100 UNIT/ML ~~LOC~~ SOLN: 0.0000 [IU] | Freq: Every day | SUBCUTANEOUS | Status: DC

## 2019-11-02 MED ORDER — ONDANSETRON HCL 4 MG/2ML IJ SOLN: 4.0000 mg | Freq: Four times a day (QID) | INTRAMUSCULAR | Status: DC | PRN

## 2019-11-02 SURGICAL SUPPLY — 11 items
CABLE SURGICAL S-101-97-12 (CABLE) ×2 IMPLANT
HEMOSTAT SURGICEL 2X4 FIBR (HEMOSTASIS) ×2 IMPLANT
IPG PACE AZUR XT DR MRI W1DR01 (Pacemaker) ×1 IMPLANT
LEAD CAPSURE NOVUS 5076-52CM (Lead) ×2 IMPLANT
LEAD CAPSURE NOVUS 5076-58CM (Lead) ×2 IMPLANT
PACE AZURE XT DR MRI W1DR01 (Pacemaker) ×2 IMPLANT
PAD PRO RADIOLUCENT 2001M-C (PAD) ×2 IMPLANT
SHEATH 7FR PRELUDE SNAP 13 (SHEATH) ×4 IMPLANT
SHEATH 7FR PRELUDE SNAP 25 (SHEATH) ×2 IMPLANT
TRAY PACEMAKER INSERTION (PACKS) ×2 IMPLANT
WIRE HI TORQ VERSACORE-J 145CM (WIRE) ×2 IMPLANT

## 2019-11-02 NOTE — Progress Notes (Addendum)
Progress Note  Patient Name: Darlene Boyer Date of Encounter: 11/02/2019  Primary Cardiologist: Minus Breeding, MD   Subjective   NO CP, no SOB  Inpatient Medications    Scheduled Meds:  chlorhexidine  60 mL Topical Once   furosemide  20 mg Oral Daily   gentamicin irrigation  80 mg Irrigation On Call   insulin aspart  0-15 Units Subcutaneous TID WC   insulin aspart  0-5 Units Subcutaneous QHS   levothyroxine  100 mcg Oral QAC breakfast   losartan  50 mg Oral Daily   sodium chloride flush  3 mL Intravenous Once   Continuous Infusions:  sodium chloride 50 mL/hr at 11/02/19 [id number]   sodium chloride 50 mL/hr at 11/02/19 0635    ceFAZolin (ANCEF) IV     PRN Meds: acetaminophen, ondansetron (ZOFRAN) IV   Vital Signs    Vitals:   11/01/19 0518 11/01/19 1400 11/01/19 2042 11/02/19 0622  BP: (!) 156/49 (!) 152/53 (!) 163/84 (!) 143/50  Pulse: (!) 35 (!) 37 (!) 42 (!) 40  Resp: 17 18 18 18   Temp: 98.4 F (36.9 C) 98.8 F (37.1 C) 98.2 F (36.8 C) (!) 97.5 F (36.4 C)  TempSrc: Oral Oral Oral Oral  SpO2: 98% 100% 100% 100%  Weight: 113.4 kg   113.5 kg  Height:        Intake/Output Summary (Last 24 hours) at 11/02/2019 0814 Last data filed at 11/02/2019 0700 Gross per 24 hour  Intake 577.96 ml  Output --  Net 577.96 ml   Last 3 Weights 11/02/2019 11/01/2019 10/31/2019  Weight (lbs) 250 lb 4.8 oz 250 lb 250 lb 12.8 oz  Weight (kg) 113.535 kg 113.399 kg 113.762 kg      Telemetry    Junctional rhythm 30's-40bpm intermittently has some SB 50's - Personally Reviewed  ECG    No new EKGs - Personally Reviewed  Physical Exam   GEN: No acute distress.   Neck: No JVD Cardiac: RRR, bradycardic, no murmurs, rubs, or gallops.  Respiratory: CTA b/l. GI: Soft, nontender, non-distended, obese MS: TEDS stockings on, 1+ edema, No deformity. Neuro:  Nonfocal  Psych: Normal affect   Labs    High Sensitivity Troponin:   Recent Labs  Lab 10/31/19 1112  10/31/19 1312  TROPONINIHS 17 19*      Chemistry Recent Labs  Lab 10/31/19 1112 11/01/19 0608  NA 141 139  K 3.3* 3.9  CL 105 108  CO2 24 23  GLUCOSE 203* 255*  BUN 27* 24*  CREATININE 1.16* 1.14*  CALCIUM 9.0 8.9  GFRNONAA 46* 47*  GFRAA 53* 54*  ANIONGAP 12 8     Hematology Recent Labs  Lab 10/31/19 1112  WBC 7.4  RBC 3.36*  HGB 10.3*  HCT 32.5*  MCV 96.7  MCH 30.7  MCHC 31.7  RDW 15.7*  PLT 238    BNPNo results for input(s): BNP, PROBNP in the last 168 hours.   DDimer  Recent Labs  Lab 11/01/19 1115  DDIMER 1.00*     Radiology    Dg Chest 2 View Result Date: 10/31/2019 CLINICAL DATA:  Right sided lower rib pain. EXAM: CHEST - 2 VIEW COMPARISON:  05/12/2015 FINDINGS: The heart size and mediastinal contours are within normal limits. Both lungs are clear. The visualized skeletal structures are unremarkable. IMPRESSION: No active cardiopulmonary disease. Electronically Signed   By: Kerby Moors M.D.   On: 10/31/2019 12:13     Ct Angio Chest Pe W Or  Wo Contrast Result Date: 11/01/2019 CLINICAL DATA:  78 year female with shortness of breath. EXAM: CT ANGIOGRAPHY CHEST WITH CONTRAST TECHNIQUE: Multidetector CT imaging of the chest was performed using the standard protocol during bolus administration of intravenous contrast. Multiplanar CT image reconstructions and MIPs were obtained to evaluate the vascular anatomy. CONTRAST:  35mL OMNIPAQUE IOHEXOL 350 MG/ML SOLN COMPARISON:  Chest radiograph dated 10/31/2019. FINDINGS: Cardiovascular: Mild cardiomegaly with mild biatrial dilatation. There is retrograde flow of contrast from the right atrium into the IVC indicative of a degree of right heart dysfunction. No pericardial effusion. Mild calcification of the mitral annulus. There is mild atherosclerotic calcification of the thoracic aorta. No pulmonary artery embolus identified. Mediastinum/Nodes: There is no hilar or mediastinal adenopathy. The esophagus is  grossly unremarkable. No mediastinal fluid collection. Lungs/Pleura: Small bilateral pleural effusions with mild associated compressive atelectasis of the lower lobes. There is no pneumothorax. The central airways are patent. Upper Abdomen: Mild irregularity of the liver contour, likely early changes of cirrhosis. Cholecystectomy. Musculoskeletal: Degenerative changes of the spine and shoulders. No acute osseous pathology. Review of the MIP images confirms the above findings. IMPRESSION: 1. No CT evidence of pulmonary artery embolus. 2. Mild cardiomegaly with mild biatrial dilatation and findings of right heart dysfunction. 3. Small bilateral pleural effusions with mild associated compressive atelectasis of the lower lobes. 4. Aortic Atherosclerosis (ICD10-I70.0). Electronically Signed   By: Anner Crete M.D.   On: 11/01/2019 16:20    Cardiac Studies    11/01/2019: TTE IMPRESSIONS  1. Left ventricular ejection fraction, by visual estimation, is 65 to 70%. The left ventricle has hyperdynamic function. There is moderately increased left ventricular hypertrophy.  2. Left ventricular diastolic parameters are consistent with Grade II diastolic dysfunction (pseudonormalization).  3. Global right ventricle has normal systolic function.The right ventricular size is normal. No increase in right ventricular wall thickness.  4. Left atrial size was moderately dilated.  5. Right atrial size was normal.  6. Moderate mitral annular calcification.  7. The mitral valve is normal in structure. Mild mitral valve regurgitation. No evidence of mitral stenosis.  8. The tricuspid valve is normal in structure. Tricuspid valve regurgitation moderate.  9. The aortic valve is normal in structure. Aortic valve regurgitation is not visualized. No evidence of aortic valve sclerosis or stenosis. 10. The pulmonic valve was normal in structure. Pulmonic valve regurgitation is not visualized. 11. Moderately elevated pulmonary  artery systolic pressure. 12. The tricuspid regurgitant velocity is 3.56 m/s, and with an assumed right atrial pressure of 8 mmHg, the estimated right ventricular systolic pressure is moderately elevated at 58.7 mmHg. 13. The inferior vena cava is normal in size with <50% respiratory variability, suggesting right atrial pressure of 8 mmHg.  Patient Profile     78 y.o. female with PMHx of AFib (suspect longstanding persistent), chronic LE edema, DM, HTN, hypothyroidism, chronic HFpEF  Admitted to [facility] 10/31/2019 with c/o DOE, progressive for 2 weeks, noted to have SB as well as junctional rhythm, her home metoprolol stopped, DOE suspect to be symptomatic bradycardia, her home Xarelto held for likely PPM implant  Assessment & Plan    1. Symptomatic bradycardia 2. Tachy-brady    3. PAFib        CHA2DS2Vasc is 5, on Xarelto at home (held for [facility] implant)  The patient remains agreeable to planned PPM implant today, she has no follow up questions since her d/w Dr. Caryl Comes yesterday   4. HFpEF     IV lasix yesterday,  scheduled for PO lasix     No output charted     Pt denies SOB this AM     (getting IVF for her procedure, will stop afterwards)   5. HTN     On home losartan     Resume BB post implant   6. DM     SSI in place   For questions or updates, please contact [place] Please consult [email or web address] for contact info under        Signed, Baldwin Jamaica, PA-C  11/02/2019, 8:14 AM    stablle  Still bradycardic  Will proceed with pacing  The benefits and risks were reviewed including but not limited to death,  perforation, infection, lead dislodgement and device malfunction.  The patient understands agrees and is willing to proceed.  Will give IV lasix following procedure  Anticipate discharge in am

## 2019-11-03 ENCOUNTER — Inpatient Hospital Stay (HOSPITAL_COMMUNITY): Payer: Medicare Other

## 2019-11-03 LAB — GLUCOSE, CAPILLARY
Glucose-Capillary: 164 mg/dL — ABNORMAL HIGH (ref 70–99)
Glucose-Capillary: 197 mg/dL — ABNORMAL HIGH (ref 70–99)

## 2019-11-03 MED FILL — Lidocaine HCl Local Inj 1%: INTRAMUSCULAR | Qty: 60 | Status: AC

## 2019-11-03 NOTE — Discharge Instructions (Signed)
° ° °  Supplemental Discharge Instructions for  [place] Patients  Activity No heavy lifting or vigorous activity with your left/right arm for 6 to 8 weeks.  Do not raise your left/right arm above your head for one week.  Gradually raise your affected arm as drawn below.              11/06/2019              11/07/2019              11/08/2019            11/09/2019 __  NO DRIVING the patient does not drive  WOUND CARE - Keep the wound area clean and dry.  Do not get this area wet for 24 hours. You may shower on  11/04/2019   . - The tape/steri-strips on your wound will fall off; do not pull them off.  No bandage is needed on the site.  DO  NOT apply any creams, oils, or ointments to the wound area. - If you notice any drainage or discharge from the wound, any swelling or bruising at the site, or you develop a fever > 101? F after you are discharged home, call the office at once.  Special Instructions - You are still able to use cellular telephones; use the ear opposite the side where you have your pacemaker/defibrillator.  Avoid carrying your cellular phone near your device. - When traveling through airports, show security personnel your identification card to avoid being screened in the metal detectors.  Ask the security personnel to use the hand wand. - Avoid arc welding equipment, MRI testing (magnetic resonance imaging), TENS units (transcutaneous nerve stimulators).  Call the office for questions about other devices. - Avoid electrical appliances that are in poor condition or are not properly grounded. - Microwave ovens are safe to be near or to operate.

## 2019-11-03 NOTE — Care Management Important Message (Signed)
Important Message  Patient Details  Name: Darlene Boyer MRN: [id number] Date of Birth: Oct 25, 1941   Medicare Important Message Given:  Yes     Shelda Altes 11/03/2019, 1:03 PM

## 2019-11-03 NOTE — Plan of Care (Signed)
  Problem: Education: Goal: Knowledge of General Education information will improve Description: Including pain rating scale, medication(s)/side effects and non-pharmacologic comfort measures Outcome: Completed/Met   Problem: Health Behavior/Discharge Planning: Goal: Ability to manage health-related needs will improve Outcome: Completed/Met   Problem: Clinical Measurements: Goal: Ability to maintain clinical measurements within normal limits will improve Outcome: Completed/Met Goal: Will remain free from infection Outcome: Completed/Met Goal: Diagnostic test results will improve Outcome: Completed/Met Goal: Respiratory complications will improve Outcome: Completed/Met Goal: Cardiovascular complication will be avoided Outcome: Completed/Met   Problem: Activity: Goal: Risk for activity intolerance will decrease Outcome: Adequate for Discharge   Problem: Nutrition: Goal: Adequate nutrition will be maintained Outcome: Completed/Met   Problem: Coping: Goal: Level of anxiety will decrease Outcome: Completed/Met   Problem: Elimination: Goal: Will not experience complications related to bowel motility Outcome: Completed/Met Goal: Will not experience complications related to urinary retention Outcome: Completed/Met   Problem: Pain Managment: Goal: General experience of comfort will improve Outcome: Completed/Met   Problem: Safety: Goal: Ability to remain free from injury will improve Outcome: Completed/Met   Problem: Skin Integrity: Goal: Risk for impaired skin integrity will decrease Outcome: Completed/Met

## 2019-11-03 NOTE — Discharge Summary (Addendum)
DISCHARGE SUMMARY    Patient ID: Darlene Boyer,  MRN: [id number], DOB/AGE: 1941-03-12 78 y.o.  Admit date: 10/31/2019 Discharge date: 11/03/2019  Primary Care Physician: Lois Huxley, PA  Primary Cardiologist: Dr. Percival Spanish Electrophysiologist: Dr. Caryl Comes (new)  Primary Discharge Diagnosis:  1. Symptomatic bradycardia status post pacemaker implantation this admission  Secondary Discharge Diagnosis:  1. Persistent AFib 2. DM 3. HTN 4. Hypothyroidism   No Known Allergies   Procedures This Admission:  1.  Implantation of a MDT dual chamber PPM on  by Dr Caryl Comes.  The patient received a Medtronic MRI compatible 5076 ventricular lead serial number [id number] and a Medtronic MRI compatible  atrial lead serial number [id number], Medtronic MRI compatible  pulse generator serial number [id number]. There were no immediate post procedure complications. 2.  CXR on 11/03/2019 demonstrated no pneumothorax status post device implantation.   Brief HPI: Darlene Boyer is a 78 y.o. female with PMHx noted above, sought medical attention with progressive DOE and worsening exertional capacity.  She was noted to be bradycardic, her home betablocker held and admitted for further w/u.   Hospital Course:  The patient was admitted , labs unrevealing with normal TSH, no anginal sounding complaints.  She had driven to [place] recently and r/o for PE (on xarelto outpatient).  She was given dose of IV diuretics with some evidence of volume OL and reportedly had not taken her lasix at home.  TTE noted preserved LVEF 65-70%, LH with grade II diastolic dysfunction.  EP was called and felt given hx of Afib with RVR in the past and now symptomatic bradycardia, diagnosed with tachy-brady and recommended PPM implant She underwent implantation of a PPM yesterday with details as outlined above.  She was monitored on telemetry overnight which demonstrated A paced, V sensed rhythm.  Left chest was without hematoma or  ecchymosis.  The device was interrogated and found to be functioning normally.  CXR was obtained and demonstrated no pneumothorax status post device implantation. Also noted no pleural effusion. Wound care, arm mobility, and restrictions were reviewed with the patient.  The patient was examined by Dr. Caryl Comes and considered stable for discharge to home.   We will resume her home Toprol and instructed not to resume her xarelto until tomorrow   Physical Exam: Vitals:   11/02/19 1715 11/02/19 2011 11/03/19 0630 11/03/19 0950  BP: 129/60 (!) 135/56 (!) 140/55 (!) 134/43  Pulse: (!) 55 (!) 59 (!) 59   Resp: 16     Temp: 98.1 F (36.7 C) 99.1 F (37.3 C) 98.3 F (36.8 C)   TempSrc: Oral Oral Oral   SpO2: 100% 100% 97%   Weight:      Height:        GEN- The patient is well appearing, alert and oriented x 3 today.   HEENT: normocephalic, atraumatic; sclera clear, conjunctiva pink; hearing intact; oropharynx clear; neck supple, no JVP Lungs- CTA b/l, normal work of breathing.  No wheezes, rales, rhonchi Heart- RRR, no murmurs, rubs or gallops, PMI not laterally displaced GI- soft, non-tender, non-distended Extremities- no clubbing, cyanosis, or edema MS- no significant deformity or atrophy Skin- warm and dry, no rash or lesion, left chest without hematoma/ecchymosis Psych- euthymic mood, full affect Neuro- no gross deficits   Labs:   Lab Results  Component Value Date   WBC 7.4 10/31/2019   HGB 10.3 (L) 10/31/2019   HCT 32.5 (L) 10/31/2019   MCV 96.7 10/31/2019   PLT 238  10/31/2019    Recent Labs  Lab 11/01/19 0608  NA 139  K 3.9  CL 108  CO2 23  BUN 24*  CREATININE 1.14*  CALCIUM 8.9  GLUCOSE 255*    Discharge Medications:  Allergies as of 11/03/2019   No Known Allergies     Medication List    STOP taking these medications   amoxicillin-clavulanate 875-125 MG tablet Commonly known as: AUGMENTIN   benzonatate 200 MG capsule Commonly known as: TESSALON       TAKE these medications   cetirizine 10 MG chewable tablet Commonly known as: ZYRTEC Chew 10 mg by mouth daily.   dextromethorphan-guaiFENesin 30-600 MG 12hr tablet Commonly known as: MUCINEX DM Take 1 tablet by mouth 2 (two) times daily.   furosemide 20 MG tablet Commonly known as: LASIX Take 20 mg by mouth.   levothyroxine 100 MCG tablet Commonly known as: SYNTHROID Take 100 mcg by mouth daily before breakfast.   losartan 50 MG tablet Commonly known as: COZAAR Take 50 mg by mouth daily.   metFORMIN 1000 MG tablet Commonly known as: GLUCOPHAGE Take 1,000 mg by mouth 2 (two) times daily with a meal.   metoprolol succinate 100 MG 24 hr tablet Commonly known as: TOPROL-XL Take 100 mg by mouth daily.   traZODone 50 MG tablet Commonly known as: DESYREL Take 50 mg by mouth at bedtime as needed for sleep.   WOMENS 50+ MULTI VITAMIN/MIN PO Take 1 tablet by mouth daily.   Xarelto 20 MG Tabs tablet Generic drug: rivaroxaban TAKE 1 TABLET BY MOUTH DAILY WITH SUPPER What changed: See the new instructions. Notes to patient: Resume tomorrow, 11/04/2019       Disposition: Home Discharge Instructions    Diet - low sodium heart healthy   Complete by: As directed    Increase activity slowly   Complete by: As directed      Follow-up Information    [facility] Follow up.   Specialty: Cardiology Why: 11/12/2019 @ 8:30AM, wound check visit Contact information: [address], [address]       Deboraha Sprang, MD Follow up.   Specialty: Cardiology Why: 02/02/2019 @ 3:15PM Contact information: [id number] [place] [id number] [phone]           Duration of Discharge Encounter: Greater than 30 minutes including physician time.  Venetia Night, PA-C 11/03/2019 10:25 AM   Patient seen and examined.  Instructions given.

## 2019-11-12 ENCOUNTER — Other Ambulatory Visit: Payer: Self-pay

## 2019-11-12 ENCOUNTER — Ambulatory Visit (INDEPENDENT_AMBULATORY_CARE_PROVIDER_SITE_OTHER): Payer: Medicare Other | Admitting: *Deleted

## 2019-11-12 DIAGNOSIS — R001 Bradycardia, unspecified: Secondary | ICD-10-CM

## 2019-11-12 LAB — CUP PACEART INCLINIC DEVICE CHECK
Battery Remaining Longevity: 172 mo
Battery Voltage: 3.23 V
Brady Statistic AP VP Percent: 5.11 %
Brady Statistic AP VS Percent: 9.65 %
Brady Statistic AS VP Percent: 9.87 %
Brady Statistic AS VS Percent: 75.39 %
Brady Statistic RA Percent Paced: 12.45 %
Brady Statistic RV Percent Paced: 12.39 %
Date Time Interrogation Session: 20201217084000
Implantable Lead Implant Date: 20201207
Implantable Lead Location: 753859
Implantable Lead Location: 753860
Implantable Lead Model: 4076
Implantable Lead Model: 4076
Implantable Pulse Generator Implant Date: 20201207
Lead Channel Impedance Value: 323 Ohm
Lead Channel Impedance Value: 494 Ohm
Lead Channel Impedance Value: 551 Ohm
Lead Channel Impedance Value: 551 Ohm
Lead Channel Pacing Threshold Amplitude: 0.5 V
Lead Channel Pacing Threshold Pulse Width: 0.4 ms
Lead Channel Sensing Intrinsic Amplitude: 17.875 mV
Lead Channel Sensing Intrinsic Amplitude: 3.25 mV
Lead Channel Setting Pacing Amplitude: 3.5 V
Lead Channel Setting Pacing Amplitude: 3.5 V
Lead Channel Setting Pacing Pulse Width: 0.4 ms
Lead Channel Setting Sensing Sensitivity: 0.9 mV

## 2019-11-12 NOTE — Patient Instructions (Signed)
Call office if you develop redness, drainage or swelling at incision site. Wash are with warm soap and water daily.

## 2019-11-12 NOTE — Progress Notes (Signed)
Wound check appointment. Steri-strips removed. Wound without redness or edema. Incision edges approximated, wound well healed. Normal device function. Thresholds, sensing, and impedances consistent with implant measurements. Device programmed at 3.5V/auto capture programmed on for extra safety margin until 3 month visit. Histogram distribution appropriate for patient and level of activity. AT/AF Burden 20.5%,AT/AF episodes appear to be AT/AF with controlled v-rates, + Xarelto. 1 high ventricular rates noted that appeared to be AT with 1:1 v-rate. Patient educated about wound care, arm mobility, lifting restrictions. ROV with Dr Caryl Comes 02/02/20 .Next remote 02/01/20.

## 2019-11-23 ENCOUNTER — Telehealth: Payer: Self-pay

## 2019-11-23 NOTE — Telephone Encounter (Signed)
The pt states her monitor was flashing orange. I gave her the number to [place] support. [employer] support did not have her in there system. I manually put the pt in [facility]. I updated her monitor information in [facility]. I had the pt to send in a transmission for the nurse to review and give her a call back. The pt thanked me for the call.

## 2019-11-24 NOTE — Telephone Encounter (Signed)
Transmission received. Normal device function.  Presenting rhythm AP/VS.  Attempted to call patient back, no answer, not able to leave a message.

## 2019-12-18 ENCOUNTER — Encounter: Payer: Self-pay | Admitting: Internal Medicine

## 2019-12-30 ENCOUNTER — Ambulatory Visit: Payer: Medicare Other | Admitting: Internal Medicine

## 2019-12-30 ENCOUNTER — Encounter: Payer: Self-pay | Admitting: Internal Medicine

## 2019-12-30 ENCOUNTER — Telehealth: Payer: Self-pay | Admitting: *Deleted

## 2019-12-30 ENCOUNTER — Other Ambulatory Visit: Payer: Self-pay

## 2019-12-30 VITALS — BP 138/70 | HR 83 | Temp 98.4°F | Ht 65.0 in | Wt 220.0 lb

## 2019-12-30 DIAGNOSIS — Z01818 Encounter for other preprocedural examination: Secondary | ICD-10-CM

## 2019-12-30 DIAGNOSIS — R140 Abdominal distension (gaseous): Secondary | ICD-10-CM

## 2019-12-30 DIAGNOSIS — R6881 Early satiety: Secondary | ICD-10-CM

## 2019-12-30 DIAGNOSIS — Z1211 Encounter for screening for malignant neoplasm of colon: Secondary | ICD-10-CM

## 2019-12-30 DIAGNOSIS — R1013 Epigastric pain: Secondary | ICD-10-CM

## 2019-12-30 MED ORDER — SUPREP BOWEL PREP KIT 17.5-3.13-1.6 GM/177ML PO SOLN: 1.0000 | ORAL | 0 refills | Status: DC

## 2019-12-30 MED ORDER — PANTOPRAZOLE SODIUM 40 MG PO TBEC: 40.0000 mg | DELAYED_RELEASE_TABLET | Freq: Every day | ORAL | 1 refills | Status: DC

## 2019-12-30 NOTE — Patient Instructions (Signed)
You have been scheduled for an endoscopy and colonoscopy. Please follow the written instructions given to you at your visit today. Please pick up your prep supplies at the pharmacy within the next 1-3 days. If you use inhalers (even only as needed), please bring them with you on the day of your procedure. Your physician has requested that you go to www.startemmi.com and enter the access code given to you at your visit today. This web site gives a general overview about your procedure. However, you should still follow specific instructions given to you by our office regarding your preparation for the procedure.  We have sent the following medications to your pharmacy for you to pick up at your convenience: Pantoprazole 40 mg daily  If you are age 72 or older, your body mass index should be between 23-30. Your Body mass index is 36.61 kg/m. If this is out of the aforementioned range listed, please consider follow up with your Primary Care Provider.  If you are age 96 or younger, your body mass index should be between 19-25. Your Body mass index is 36.61 kg/m. If this is out of the aformentioned range listed, please consider follow up with your Primary Care Provider.   Due to recent changes in healthcare laws, you may see the results of your imaging and laboratory studies on MyChart before your provider has had a chance to review them.  We understand that in some cases there may be results that are confusing or concerning to you. Not all laboratory results come back in the same time frame and the provider may be waiting for multiple results in order to interpret others.  Please give [place] 48 hours in order for your provider to thoroughly review all the results before contacting the office for clarification of your results.

## 2019-12-30 NOTE — Telephone Encounter (Signed)
Left message for patient to call back  

## 2019-12-30 NOTE — Telephone Encounter (Signed)
Pt takes Xarelto for afib with CHADS2VASc score of 5 (age x2, sex, HTN, DM). SCr 1.14, CrCl 39mL/min. Ok to hold Xarelto for 2 days prior to procedure as requested.

## 2019-12-30 NOTE — Progress Notes (Signed)
Patient ID: Darlene Boyer, female   DOB: 06/08/1941, 79 y.o.   MRN: [id number] HPI: Darlene Boyer is a 79 year old female with a history of atrial fibrillation status post recent pacemaker placement on Xarelto, diabetes, hypertension, thyroid disease and arthritis who is seen to evaluate upper abdominal fullness and bloating.  She is here alone today.  She reports that she has had some early satiety, bloating and an uncomfortable feeling in her epigastrium over the last 1 to 2 months.  She reports she has to push on her epigastrium to get relief.  She will feel discomfort in the epigastrium which radiates around to her right upper quadrant and into her right mid back.  She describes this as a sensation of "butterflies".  She does have some indigestion and belching but no heartburn or pyrosis.  No nausea or vomiting.  No dysphagia.  Bowel movements for her have not changed she often uses stool softeners.  She has bowel movements 2 to 3 days/week without blood or melena.  She had a colonoscopy just over 10 years ago with Dr. Amedeo Plenty.  She was told she had one polyp but it was "okay".  She recalls being due for colonoscopy November 2020.  She has had prior cholecystectomy and hysterectomy.  Family history of breast cancer.  Never tobacco user, she is widowed.  She has 5 adult children, 1 of which is a patient of mine, Darlene Boyer.  No alcohol use.  Past Medical History:  Diagnosis Date  . Arthritis   . Atrial fibrillation ([place])   . Diabetes mellitus without complication ([place])   . Hypertension   . Obesity   . Thyroid disease     Past Surgical History:  Procedure Laterality Date  . ABDOMINAL HYSTERECTOMY     partial  . ANKLE FRACTURE SURGERY Right   . EYE SURGERY     Lens implants  . FOOT SURGERY Left    Pins in 3 toes  . GALLBLADDER SURGERY    . PACEMAKER IMPLANT N/A 11/02/2019   Procedure: PACEMAKER IMPLANT;  Surgeon: Deboraha Sprang, MD;  Location: [facility];  Service:  Cardiovascular;  Laterality: N/A;  . TUBAL LIGATION      Outpatient Medications Prior to Visit  Medication Sig Dispense Refill  . dextromethorphan-guaiFENesin (MUCINEX DM) 30-600 MG 12hr tablet Take 1 tablet by mouth 2 (two) times daily.    . furosemide (LASIX) 20 MG tablet Take 20 mg by mouth.    . levothyroxine (SYNTHROID, LEVOTHROID) 100 MCG tablet Take 100 mcg by mouth daily before breakfast.    . loratadine (CLARITIN) 10 MG tablet Take 10 mg by mouth every other day.    . losartan (COZAAR) 50 MG tablet Take 50 mg by mouth daily.    . metFORMIN (GLUCOPHAGE) 1000 MG tablet Take 1,000 mg by mouth 2 (two) times daily with a meal.    . metoprolol succinate (TOPROL-XL) 100 MG 24 hr tablet Take 100 mg by mouth daily.  5  . Multiple Vitamins-Minerals (WOMENS 50+ MULTI VITAMIN/MIN PO) Take 1 tablet by mouth daily.    . traZODone (DESYREL) 50 MG tablet Take 50 mg by mouth at bedtime as needed for sleep.     Alveda Reasons 20 MG TABS tablet TAKE 1 TABLET BY MOUTH DAILY WITH SUPPER (Patient taking differently: Take 20 mg by mouth daily with supper. ) 90 tablet 1  . cetirizine (ZYRTEC) 10 MG chewable tablet Chew 10 mg by mouth daily.     No  facility-administered medications prior to visit.    No Known Allergies  Family History  Problem Relation Age of Onset  . Emphysema Father   . Cancer Mother        told lung and breast  . Colon cancer Neg Hx   . Esophageal cancer Neg Hx   . Rectal cancer Neg Hx     Social History   Tobacco Use  . Smoking status: Never Smoker  . Smokeless tobacco: Current User    Types: Snuff  . Tobacco comment: "every now and then"  Substance Use Topics  . Alcohol use: No    Alcohol/week: 0.0 standard drinks  . Drug use: No    ROS: As per history of present illness, otherwise negative  BP 138/70   Pulse 83   Temp 98.4 F (36.9 C)   Ht 5\' 5"  (1.651 m)   Wt 220 lb (99.8 kg)   BMI 36.61 kg/m  Constitutional: Well-developed and well-nourished. No  distress. HEENT: Normocephalic and atraumatic.  Conjunctivae are normal.  No scleral icterus. Neck: Neck supple. Trachea midline. Cardiovascular: Normal rate, regular rhythm and intact distal pulses.  Pulmonary/chest: Effort normal and breath sounds normal. No wheezing, rales or rhonchi. Abdominal: Soft, nontender, nondistended. Bowel sounds active throughout.  Extremities: no clubbing, cyanosis, or edema Neurological: Alert and oriented to person place and time. Skin: Skin is warm and dry.  Keloid scar abdomen right upper quadrant Psychiatric: Normal mood and affect. Behavior is normal.  RELEVANT LABS AND IMAGING: CBC    Component Value Date/Time   WBC 7.4 10/31/2019 1112   RBC 3.36 (L) 10/31/2019 1112   HGB 10.3 (L) 10/31/2019 1112   HCT 32.5 (L) 10/31/2019 1112   PLT 238 10/31/2019 1112   MCV 96.7 10/31/2019 1112   MCH 30.7 10/31/2019 1112   MCHC 31.7 10/31/2019 1112   RDW 15.7 (H) 10/31/2019 1112   LYMPHSABS 1.8 09/18/2010 2350   MONOABS 0.5 09/18/2010 2350   EOSABS 0.1 09/18/2010 2350   BASOSABS 0.0 09/18/2010 2350    CMP     Component Value Date/Time   NA 139 11/01/2019 0608   NA 141 12/31/2018 1537   K 3.9 11/01/2019 0608   CL 108 11/01/2019 0608   CO2 23 11/01/2019 0608   GLUCOSE 255 (H) 11/01/2019 0608   BUN 24 (H) 11/01/2019 0608   BUN 11 12/31/2018 1537   CREATININE 1.14 (H) 11/01/2019 0608   CALCIUM 8.9 11/01/2019 0608   PROT 6.3 12/17/2018 1551   ALBUMIN 4.1 12/17/2018 1551   AST 14 12/17/2018 1551   ALT 9 12/17/2018 1551   ALKPHOS 79 12/17/2018 1551   BILITOT 0.3 12/17/2018 1551   GFRNONAA 47 (L) 11/01/2019 0608   GFRAA 54 (L) 11/01/2019 [id number]    ASSESSMENT/PLAN: 79 year old female with a history of atrial fibrillation status post recent pacemaker placement on Xarelto, diabetes, hypertension, thyroid disease and arthritis who is seen to evaluate upper abdominal fullness and bloating.  1.  Abdominal bloating/early satiety/epigastric abdominal pain  --this could be a GERD type symptom or dyspepsia/gastritis.  I recommended that we try pantoprazole 40 mg daily and perform an upper endoscopy.  We discussed the risk, benefits and alternatives and she is agreeable and wishes to proceed --Pantoprazole 40 mg once daily --Upper endoscopy  2.  CRC screening --screening colonoscopy due at this time.  We discussed the risk, benefits and alternatives and she is agreeable and wishes to proceed.  To be performed on the same day as her upper  endoscopy.  Request records from Dr. Amedeo Plenty from last colonoscopy  Her Xarelto will need to be held 48 hours before the procedure and we will contact Dr. Percival Spanish to ensure this is reasonable.  Will hold Xarelto 2 days prior to endoscopic procedures - will instruct when and how to resume after procedure. Benefits and risks of procedure explained including risks of bleeding, perforation, infection, missed lesions, reactions to medications and possible need for hospitalization and surgery for complications. Additional rare but real risk of stroke or other vascular clotting events off Xarelto also explained and need to seek urgent help if any signs of these problems occur. Will communicate by phone or EMR with patient's  prescribing provider to confirm that holding Xarelto is reasonable in this case.      [id number], [address], [place],  [place] [address]

## 2019-12-30 NOTE — Telephone Encounter (Signed)
Request for surgical clearance:     Endoscopy Procedure  What type of surgery is being performed?     Endoscopy/Colonoscopy  When is this surgery scheduled?     01/27/2020  What type of clearance is required ?   Pharmacy  Are there any medications that need to be held prior to surgery and how long? Xarelto, 2 days  Practice name and name of physician performing surgery?      [facility]  What is your office phone and fax number?      Phone- [phone]  Fax361 [phone]  Anesthesia type (None, local, MAC, general) ?       MAC

## 2019-12-31 NOTE — Telephone Encounter (Signed)
Patient has been advised that she may hold Xarelto 2 days prior to procedure as per Dr Hochrein's office. Patient verbalizes understanding of this.

## 2019-12-31 NOTE — Telephone Encounter (Signed)
   Primary Cardiologist: Minus Breeding, MD  Chart reviewed as part of pre-operative protocol coverage.   Per pharmacy, [place] to hold Xarelto for 2 days prior to procedure as requested.  I will route this recommendation to the requesting party via Epic fax function and remove from pre-op pool.  Please call with questions.  Darreld Mclean, PA-C 12/31/2019, 8:54 AM

## 2020-01-20 ENCOUNTER — Other Ambulatory Visit: Payer: Self-pay | Admitting: Cardiology

## 2020-01-22 ENCOUNTER — Ambulatory Visit (INDEPENDENT_AMBULATORY_CARE_PROVIDER_SITE_OTHER): Payer: Medicare Other

## 2020-01-22 ENCOUNTER — Other Ambulatory Visit: Payer: Self-pay

## 2020-01-22 ENCOUNTER — Other Ambulatory Visit: Payer: Self-pay | Admitting: Internal Medicine

## 2020-01-22 DIAGNOSIS — Z1159 Encounter for screening for other viral diseases: Secondary | ICD-10-CM

## 2020-01-23 LAB — SARS CORONAVIRUS 2 (TAT 6-24 HRS): SARS Coronavirus 2: NEGATIVE

## 2020-01-27 ENCOUNTER — Other Ambulatory Visit: Payer: Self-pay

## 2020-01-27 ENCOUNTER — Encounter: Payer: Self-pay | Admitting: Internal Medicine

## 2020-01-27 ENCOUNTER — Ambulatory Visit (AMBULATORY_SURGERY_CENTER): Payer: Medicare Other | Admitting: Internal Medicine

## 2020-01-27 VITALS — BP 136/70 | HR 63 | Temp 97.1°F | Resp 13 | Ht 65.0 in | Wt 220.0 lb

## 2020-01-27 DIAGNOSIS — D123 Benign neoplasm of transverse colon: Secondary | ICD-10-CM | POA: Diagnosis not present

## 2020-01-27 DIAGNOSIS — D122 Benign neoplasm of ascending colon: Secondary | ICD-10-CM | POA: Diagnosis not present

## 2020-01-27 DIAGNOSIS — K449 Diaphragmatic hernia without obstruction or gangrene: Secondary | ICD-10-CM

## 2020-01-27 DIAGNOSIS — K2950 Unspecified chronic gastritis without bleeding: Secondary | ICD-10-CM

## 2020-01-27 DIAGNOSIS — D124 Benign neoplasm of descending colon: Secondary | ICD-10-CM | POA: Diagnosis not present

## 2020-01-27 DIAGNOSIS — D120 Benign neoplasm of cecum: Secondary | ICD-10-CM | POA: Diagnosis not present

## 2020-01-27 DIAGNOSIS — Z1211 Encounter for screening for malignant neoplasm of colon: Secondary | ICD-10-CM | POA: Diagnosis not present

## 2020-01-27 DIAGNOSIS — K3189 Other diseases of stomach and duodenum: Secondary | ICD-10-CM | POA: Diagnosis not present

## 2020-01-27 DIAGNOSIS — D125 Benign neoplasm of sigmoid colon: Secondary | ICD-10-CM

## 2020-01-27 DIAGNOSIS — R1013 Epigastric pain: Secondary | ICD-10-CM

## 2020-01-27 DIAGNOSIS — R6881 Early satiety: Secondary | ICD-10-CM

## 2020-01-27 HISTORY — PX: UPPER GASTROINTESTINAL ENDOSCOPY: SHX188

## 2020-01-27 HISTORY — PX: COLONOSCOPY: SHX174

## 2020-01-27 MED ORDER — SODIUM CHLORIDE 0.9 % IV SOLN: 500.0000 mL | Freq: Once | INTRAVENOUS | Status: DC

## 2020-01-27 NOTE — Progress Notes (Signed)
Temp check by:JB Vital check by:CW  The medical and surgical history was reviewed and verified with the patient. 

## 2020-01-27 NOTE — Progress Notes (Signed)
Called to room to assist during endoscopic procedure.  Patient ID and intended procedure confirmed with present staff. Received instructions for my participation in the procedure from the performing physician.  

## 2020-01-27 NOTE — Op Note (Signed)
[facility] Patient Name: Darlene Boyer Procedure Date: 01/27/2020 10:16 AM MRN: [id number] Endoscopist: Jerene Bears , MD Age: 79 Referring MD:  Date of Birth: [birth year] Gender: Female Account #: [id number] Procedure:                Upper GI endoscopy Indications:              Epigastric abdominal pain, Early satiety Medicines:                Monitored Anesthesia Care Procedure:                Pre-Anesthesia Assessment:                           - Prior to the procedure, a History and Physical                            was performed, and patient medications and                            allergies were reviewed. The patient's tolerance of                            previous anesthesia was also reviewed. The risks                            and benefits of the procedure and the sedation                            options and risks were discussed with the patient.                            All questions were answered, and informed consent                            was obtained. Prior Anticoagulants: The patient has                            taken Xarelto (rivaroxaban), last dose was 2 days                            prior to procedure. ASA Grade Assessment: III - A                            patient with severe systemic disease. After                            reviewing the risks and benefits, the patient was                            deemed in satisfactory condition to undergo the                            procedure.                           -  Prior to the procedure, a History and Physical                            was performed, and patient medications and                            allergies were reviewed. The patient's tolerance of                            previous anesthesia was also reviewed. The risks                            and benefits of the procedure and the sedation                            options and risks were discussed with the patient.                         All questions were answered, and informed consent                            was obtained. Prior Anticoagulants: The patient has                            taken Xarelto (rivaroxaban), last dose was 2 days                            prior to procedure. ASA Grade Assessment: III - A                            patient with severe systemic disease. After                            reviewing the risks and benefits, the patient was                            deemed in satisfactory condition to undergo the                            procedure.                           After obtaining informed consent, the endoscope was                            passed under direct vision. Throughout the                            procedure, the patient's blood pressure, pulse, and                            oxygen saturations were monitored continuously. The  Endoscope was introduced through the mouth, and                            advanced to the second part of duodenum. The upper                            GI endoscopy was accomplished without difficulty.                            The patient tolerated the procedure well. Scope In: Scope Out: Findings:                 The examined esophagus was normal.                           A 2-3 cm hiatal hernia was present.                           Scattered mild inflammation characterized by                            erythema was found in the gastric body and in the                            gastric antrum. Biopsies were taken with a cold                            forceps for histology and Helicobacter pylori                            testing.                           The examined duodenum was normal. Complications:            No immediate complications. Estimated Blood Loss:     Estimated blood loss was minimal. Impression:               - Normal esophagus.                           - 2-3 cm hiatal hernia.                            - Mild gastritis. Biopsied.                           - Normal examined duodenum. Recommendation:           - Patient has a contact number available for                            emergencies. The signs and symptoms of potential                            delayed complications were discussed with the  patient. Return to normal activities tomorrow.                            Written discharge instructions were provided to the                            patient.                           - Resume previous diet.                           - Continue present medications.                           - Await pathology results.                           - Office follow-up in 2-3 months to ensure symptom                            improvement.                           - See the other procedure note for documentation of                            additional recommendations. Jerene Bears, MD 01/27/2020 11:20:35 AM This report has been signed electronically.

## 2020-01-27 NOTE — Op Note (Signed)
[facility] Patient Name: Darlene Boyer Procedure Date: 01/27/2020 10:16 AM MRN: [id number] Endoscopist: Jerene Bears , MD Age: 79 Referring MD:  Date of Birth: 11-11-1941 Gender: Female Account #: [id number] Procedure:                Colonoscopy Indications:              Screening for colorectal malignant neoplasm, Last                            colonoscopy 10 years ago Medicines:                Monitored Anesthesia Care Procedure:                Pre-Anesthesia Assessment:                           - Prior to the procedure, a History and Physical                            was performed, and patient medications and                            allergies were reviewed. The patient's tolerance of                            previous anesthesia was also reviewed. The risks                            and benefits of the procedure and the sedation                            options and risks were discussed with the patient.                            All questions were answered, and informed consent                            was obtained. Prior Anticoagulants: The patient has                            taken Xarelto (rivaroxaban), last dose was 2 days                            prior to procedure. ASA Grade Assessment: III - A                            patient with severe systemic disease. After                            reviewing the risks and benefits, the patient was                            deemed in satisfactory condition to undergo the  procedure.                           After obtaining informed consent, the colonoscope                            was passed under direct vision. Throughout the                            procedure, the patient's blood pressure, pulse, and                            oxygen saturations were monitored continuously. The                            Colonoscope was introduced through the anus and          advanced to the cecum, identified by appendiceal                            orifice and ileocecal valve. The colonoscopy was                            performed without difficulty though colon was                            redundant in the sigmoid (adult colonoscope                            recommended in the future). The patient tolerated                            the procedure well. The quality of the bowel                            preparation was good. The ileocecal valve,                            appendiceal orifice, and rectum were photographed. Scope In: 10:28:19 AM Scope Out: 11:03:15 AM Scope Withdrawal Time: 0 hours 27 minutes 33 seconds  Total Procedure Duration: 0 hours 34 minutes 56 seconds  Findings:                 The digital rectal exam was normal.                           Two sessile polyps were found in the cecum. The                            polyps were 4 to 7 mm in size. These polyps were                            removed with a cold snare. Resection and retrieval  were complete.                           Two sessile polyps were found in the ascending                            colon. The polyps were 5 to 7 mm in size. These                            polyps were removed with a cold snare. Resection                            and retrieval were complete.                           Seven sessile polyps were found in the transverse                            colon. The polyps were 4 to 10 mm in size. These                            polyps were removed with a cold snare. Resection                            and retrieval were complete.                           Five sessile polyps were found in the descending                            colon. The polyps were 4 to 9 mm in size. These                            polyps were removed with a cold snare. Resection                            and retrieval were complete.                            A 14 mm polyp was found in the sigmoid colon. The                            polyp was semi-pedunculated. The polyp was removed                            with a cold snare. Resection and retrieval were                            complete. To stop active bleeding, two hemostatic                            clips were successfully placed. There was no  bleeding at the end of the maneuver.                           A 5 mm polyp was found in the sigmoid colon. The                            polyp was sessile. The polyp was removed with a                            cold snare. Resection and retrieval were complete.                           Internal hemorrhoids were found during                            retroflexion. The hemorrhoids were small. Complications:            No immediate complications. Estimated Blood Loss:     Estimated blood loss was minimal. Impression:               - Two 4 to 7 mm polyps in the cecum, removed with a                            cold snare. Resected and retrieved.                           - Two 5 to 7 mm polyps in the ascending colon,                            removed with a cold snare. Resected and retrieved.                           - Seven 4 to 10 mm polyps in the transverse colon,                            removed with a cold snare. Resected and retrieved.                           - Five 4 to 9 mm polyps in the descending colon,                            removed with a cold snare. Resected and retrieved.                           - One 14 mm polyp in the sigmoid colon, removed                            with a cold snare. Resected and retrieved. Clips                            were placed.                           -  One 5 mm polyp in the sigmoid colon, removed with                            a cold snare. Resected and retrieved.                           - Internal hemorrhoids. Recommendation:           - Patient  has a contact number available for                            emergencies. The signs and symptoms of potential                            delayed complications were discussed with the                            patient. Return to normal activities tomorrow.                            Written discharge instructions were provided to the                            patient.                           - Resume previous diet.                           - Continue present medications.                           - Await pathology results.                           - Repeat colonoscopy is recommended for                            surveillance. The colonoscopy date will be                            determined after pathology results from today's                            exam become available for review.                           - Resume Xarelto (rivaroxaban) at prior dose                            tomorrow. Refer to managing physician for further                            adjustment of therapy. Jerene Bears, MD 01/27/2020 11:17:27 AM This report has been signed electronically.

## 2020-01-27 NOTE — Patient Instructions (Signed)
Please read handouts provided. Continue present medications. Await pathology results. Clip card. Resume Xarelto ( rivaroxaban ) at prior dose tomorrow. Office follow-up in 2-3 months.      YOU HAD AN ENDOSCOPIC PROCEDURE TODAY AT [facility]:   Refer to the procedure report that was given to you for any specific questions about what was found during the examination.  If the procedure report does not answer your questions, please call your gastroenterologist to clarify.  If you requested that your care partner not be given the details of your procedure findings, then the procedure report has been included in a sealed envelope for you to review at your convenience later.  YOU SHOULD EXPECT: Some feelings of bloating in the abdomen. Passage of more gas than usual.  Walking can help get rid of the air that was put into your GI tract during the procedure and reduce the bloating. If you had a lower endoscopy (such as a colonoscopy or flexible sigmoidoscopy) you may notice spotting of blood in your stool or on the toilet paper. If you underwent a bowel prep for your procedure, you may not have a normal bowel movement for a few days.  Please Note:  You might notice some irritation and congestion in your nose or some drainage.  This is from the oxygen used during your procedure.  There is no need for concern and it should clear up in a day or so.  SYMPTOMS TO REPORT IMMEDIATELY:   Following lower endoscopy (colonoscopy or flexible sigmoidoscopy):  Excessive amounts of blood in the stool  Significant tenderness or worsening of abdominal pains  Swelling of the abdomen that is new, acute  Fever of 100F or higher   Following upper endoscopy (EGD)  Vomiting of blood or coffee ground material  New chest pain or pain under the shoulder blades  Painful or persistently difficult swallowing  New shortness of breath  Fever of 100F or higher  Black, tarry-looking stools  For urgent  or emergent issues, a gastroenterologist can be reached at any hour by calling [phone]. Do not use MyChart messaging for urgent concerns.    DIET:  We do recommend a small meal at first, but then you may proceed to your regular diet.  Drink plenty of fluids but you should avoid alcoholic beverages for 24 hours.  ACTIVITY:  You should plan to take it easy for the rest of today and you should NOT DRIVE or use heavy machinery until tomorrow (because of the sedation medicines used during the test).    FOLLOW UP: Our staff will call the number listed on your records 48-72 hours following your procedure to check on you and address any questions or concerns that you may have regarding the information given to you following your procedure. If we do not reach you, we will leave a message.  We will attempt to reach you two times.  During this call, we will ask if you have developed any symptoms of COVID 19. If you develop any symptoms (ie: fever, flu-like symptoms, shortness of breath, cough etc.) before then, please call [phone].  If you test positive for Covid 19 in the 2 weeks post procedure, please call and report this information to [place].    If any biopsies were taken you will be contacted by phone or by letter within the next 1-3 weeks.  Please call us at [phone] if you have not heard about the biopsies in 3 weeks.  SIGNATURES/CONFIDENTIALITY: You and/or your care partner have signed paperwork which will be entered into your electronic medical record.  These signatures attest to the fact that that the information above on your After Visit Summary has been reviewed and is understood.  Full responsibility of the confidentiality of this discharge information lies with you and/or your care-partner.

## 2020-01-27 NOTE — Progress Notes (Signed)
Report to PACU, RN, vss, BBS= Clear.  

## 2020-01-29 ENCOUNTER — Telehealth: Payer: Self-pay

## 2020-01-29 NOTE — Telephone Encounter (Signed)
Left message on follow up call. 

## 2020-01-29 NOTE — Telephone Encounter (Signed)
Unable to reach patient on follow up call

## 2020-02-01 ENCOUNTER — Encounter: Payer: Self-pay | Admitting: Internal Medicine

## 2020-02-01 ENCOUNTER — Ambulatory Visit (INDEPENDENT_AMBULATORY_CARE_PROVIDER_SITE_OTHER): Payer: Medicare Other | Admitting: *Deleted

## 2020-02-01 DIAGNOSIS — R001 Bradycardia, unspecified: Secondary | ICD-10-CM | POA: Diagnosis not present

## 2020-02-01 LAB — CUP PACEART REMOTE DEVICE CHECK
Battery Remaining Longevity: 163 mo
Battery Voltage: 3.21 V
Brady Statistic AP VP Percent: 0.5 %
Brady Statistic AP VS Percent: 64.36 %
Brady Statistic AS VP Percent: 5.11 %
Brady Statistic AS VS Percent: 29.98 %
Brady Statistic RA Percent Paced: 23.89 %
Brady Statistic RV Percent Paced: 28.69 %
Date Time Interrogation Session: 20210307232238
Implantable Lead Implant Date: 20201207
Implantable Lead Location: 753859
Implantable Lead Location: 753860
Implantable Lead Model: 4076
Implantable Lead Model: 4076
Implantable Pulse Generator Implant Date: 20201207
Lead Channel Impedance Value: 342 Ohm
Lead Channel Impedance Value: 513 Ohm
Lead Channel Impedance Value: 551 Ohm
Lead Channel Impedance Value: 646 Ohm
Lead Channel Pacing Threshold Amplitude: 0.5 V
Lead Channel Pacing Threshold Amplitude: 0.625 V
Lead Channel Pacing Threshold Pulse Width: 0.4 ms
Lead Channel Pacing Threshold Pulse Width: 0.4 ms
Lead Channel Sensing Intrinsic Amplitude: 20 mV
Lead Channel Sensing Intrinsic Amplitude: 20 mV
Lead Channel Sensing Intrinsic Amplitude: 3 mV
Lead Channel Sensing Intrinsic Amplitude: 3 mV
Lead Channel Setting Pacing Amplitude: 3.5 V
Lead Channel Setting Pacing Amplitude: 3.5 V
Lead Channel Setting Pacing Pulse Width: 0.4 ms
Lead Channel Setting Sensing Sensitivity: 0.9 mV

## 2020-02-01 NOTE — Progress Notes (Signed)
PPM Remote  

## 2020-02-02 ENCOUNTER — Other Ambulatory Visit: Payer: Self-pay

## 2020-02-02 ENCOUNTER — Ambulatory Visit: Payer: Medicare Other | Admitting: Internal Medicine

## 2020-02-02 VITALS — BP 126/60 | HR 75 | Ht 65.0 in | Wt 237.0 lb

## 2020-02-02 DIAGNOSIS — R001 Bradycardia, unspecified: Secondary | ICD-10-CM

## 2020-02-02 DIAGNOSIS — G4730 Sleep apnea, unspecified: Secondary | ICD-10-CM | POA: Diagnosis not present

## 2020-02-02 DIAGNOSIS — Z950 Presence of cardiac pacemaker: Secondary | ICD-10-CM | POA: Diagnosis not present

## 2020-02-02 NOTE — Patient Instructions (Signed)
Medication Instructions:  Your physician recommends that you continue on your current medications as directed. Please refer to the Current Medication list given to you today.  Labwork: None ordered.  Testing/Procedures: Your physician has recommended that you have a sleep study. This test records several body functions during sleep, including: brain activity, eye movement, oxygen and carbon dioxide blood levels, heart rate and rhythm, breathing rate and rhythm, the flow of air through your mouth and nose, snoring, body muscle movements, and chest and belly movement.    Follow-Up: Your physician wants you to follow-up in: 6-8 weeks.  Dr Olin Pia scheduler will call you with appointment.  Remote monitoring is used to monitor your Pacemaker of ICD from home. This monitoring reduces the number of office visits required to check your device to one time per year. It allows [place] to keep an eye on the functioning of your device to ensure it is working properly.   Any Other Special Instructions Will Be Listed Below (If Applicable).  If you need a refill on your cardiac medications before your next appointment, please call your pharmacy.

## 2020-02-02 NOTE — Progress Notes (Signed)
Patient Care Team: Lois Huxley, PA as PCP - General (Family Medicine) Minus Breeding, MD as PCP - Cardiology (Cardiology)   HPI  Darlene Boyer is a 79 y.o. 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  Has excluded salt from her diet.  Has eliminated most of the sugar from her diet.  Has lost about 10 pounds.   Does not know if she snores but has significant daytime fatigue.  Has occasional palpitations that she sometimes feel underneath near her right flank as well as sometimes when she eats so that she cannot get a deep swallow.  Echo EF [id number]  Thromboembolic risk factors ( age  -2, HTN-1, DM-1, Vasc disease -1, Gender-1) for a CHADSVASc Score of >=6   Past Medical History:  Diagnosis Date  . Arthritis   . Atrial fibrillation ([place])   . Diabetes mellitus without complication ([place])   . Hypertension   . Obesity   . Thyroid disease     Past Surgical History:  Procedure Laterality Date  . ABDOMINAL HYSTERECTOMY     partial  . ANKLE FRACTURE SURGERY Right   . COLONOSCOPY  01/27/2020  . EYE SURGERY     Lens implants  . FOOT SURGERY Left    Pins in 3 toes  . GALLBLADDER SURGERY    . PACEMAKER IMPLANT N/A 11/02/2019   Procedure: PACEMAKER IMPLANT;  Surgeon: Deboraha Sprang, MD;  Location: [place] [facility];  Service: Cardiovascular;  Laterality: N/A;  . TUBAL LIGATION    . UPPER GASTROINTESTINAL ENDOSCOPY  01/27/2020    Current Meds  Medication Sig  . dextromethorphan-guaiFENesin (MUCINEX DM) 30-600 MG 12hr tablet Take 1 tablet by mouth 2 (two) times daily.  . furosemide (LASIX) 20 MG tablet Take 20 mg by mouth.  . levothyroxine (SYNTHROID, LEVOTHROID) 100 MCG tablet Take 100 mcg by mouth daily before breakfast.  . loratadine (CLARITIN) 10 MG tablet Take 10 mg by mouth every other day.  . losartan (COZAAR) 50 MG tablet Take 50 mg by mouth daily.  . metFORMIN (GLUCOPHAGE) 1000 MG tablet Take  1,000 mg by mouth 2 (two) times daily with a meal.  . metoprolol succinate (TOPROL-XL) 100 MG 24 hr tablet Take 100 mg by mouth daily.  . Multiple Vitamins-Minerals (WOMENS 50+ MULTI VITAMIN/MIN PO) Take 1 tablet by mouth daily.  . pantoprazole (PROTONIX) 40 MG tablet Take 1 tablet (40 mg total) by mouth daily.  . traZODone (DESYREL) 50 MG tablet Take 50 mg by mouth at bedtime as needed for sleep.   Alveda Reasons 20 MG TABS tablet TAKE 1 TABLET BY MOUTH DAILY WITH SUPPER    No Known Allergies    Review of Systems negative except from HPI and PMH  Physical Exam BP 126/60   Pulse 75   Ht 5\' 5"  (1.651 m)   Wt 237 lb (107.5 kg)   SpO2 98%   BMI 39.44 kg/m  Well developed and well nourished in no acute distress HENT normal E scleral and icterus clear Neck Supple JVP flat; carotids brisk and full Clear to ausculation Device pocket well healed; without hematoma or erythema.  There is no tethering Regular rate and rhythm, no murmurs gallops or rub Soft with active bowel sounds No clubbing cyanosis  Edema Alert and oriented, grossly normal motor and sensory function Skin Warm and Dry  ECG atrial pacing with prolonged AV conduction and PVCs in a pattern of bigeminy Intervals 30/10/44  PVCs left bundle inferior axis  CrCl cannot be calculated (Patient's most recent lab result is older than the maximum 21 days allowed.).   Assessment and  Plan  Atrial fibrillation-persistent  Bradycardia  Pacemaker-Medtronic  1AVBlock   PVCs  Palpitations  Sleep disordered breathing  The patient feels better following pacing.  Has infrequent atrial fibrillation the rates are pretty well controlled.  She is not aware as best as I can tell of her episodes of atrial fibrillation; hence, will continue with strategy of rate control.  The palpitations that she is feeling on her right flank and perhaps an eating perhaps are associated with bigeminal PVCs noted on her ECG.  If they do not abate,  would think about using an event recorder to try to correlate her symptoms with the ectopy.  The burden of the ectopy based on histograms is not that great.  Unfortunately, I do not see a PVC counter with her interrogation.  She does have infrequent rapid rates with her atrial fibrillation but overall rate control is quite good.  At this juncture, we will leave her programmed MVP so as to allow intrinsic conduction with a narrow QRS.  Her first-degree AV block is not trivial however it is less than about 350 ms which is I think probably a notable transition point to where she might benefit from programmed AV synchrony  Current medicines are reviewed at length with the patient today .  The patient does not have concerns regarding medicines.

## 2020-02-04 ENCOUNTER — Telehealth: Payer: Self-pay | Admitting: *Deleted

## 2020-02-04 LAB — CUP PACEART INCLINIC DEVICE CHECK
Battery Remaining Longevity: 162 mo
Battery Voltage: 3.21 V
Brady Statistic AP VP Percent: 2.11 %
Brady Statistic AP VS Percent: 54.53 %
Brady Statistic AS VP Percent: 5.83 %
Brady Statistic AS VS Percent: 37.47 %
Brady Statistic RA Percent Paced: 25.93 %
Brady Statistic RV Percent Paced: 26.42 %
Date Time Interrogation Session: 20210309182322
Implantable Lead Implant Date: 20201207
Implantable Lead Location: 753859
Implantable Lead Location: 753860
Implantable Lead Model: 4076
Implantable Lead Model: 4076
Implantable Pulse Generator Implant Date: 20201207
Lead Channel Impedance Value: 323 Ohm
Lead Channel Impedance Value: 475 Ohm
Lead Channel Impedance Value: 532 Ohm
Lead Channel Impedance Value: 608 Ohm
Lead Channel Pacing Threshold Amplitude: 0.5 V
Lead Channel Pacing Threshold Amplitude: 0.75 V
Lead Channel Pacing Threshold Pulse Width: 0.4 ms
Lead Channel Pacing Threshold Pulse Width: 0.4 ms
Lead Channel Sensing Intrinsic Amplitude: 2.875 mV
Lead Channel Sensing Intrinsic Amplitude: 20.375 mV
Lead Channel Setting Pacing Amplitude: 1.5 V
Lead Channel Setting Pacing Amplitude: 2.5 V
Lead Channel Setting Pacing Pulse Width: 0.4 ms
Lead Channel Setting Sensing Sensitivity: 0.9 mV

## 2020-02-04 NOTE — Telephone Encounter (Signed)
-----   Message from Lauralee Evener, [place] sent at 02/04/2020 12:02 PM EST ----- Regarding: RE: sleep study Ok to schedule. Per [employer] no PA is required. Decision ID [id number]. ----- Message ----- From: Thora Lance, RN Sent: 02/02/2020   5:07 PM EST To: Cv Div Sleep Studies Subject: sleep study                                    Please schedule pt for sleep study per Dr Caryl Comes.  Thank you,  Rosann Auerbach

## 2020-02-04 NOTE — Telephone Encounter (Signed)
Staff message sent to Gae Bon ok to schedule sleep study. Per [facility] web portal no PA is required. Decision ID #: [id number].

## 2020-02-04 NOTE — Telephone Encounter (Signed)
Patient is scheduled for lab study on 02/27/20. pt is scheduled for COVID screening on 02/24/20 12:30 prior to SS. Patient understands her sleep study will be done at [facility] sleep lab. Patient understands she will receive a sleep packet in a week or so. Patient understands to call if she does not receive the sleep packet in a timely manner. Patient agrees with treatment and thanked me for call.

## 2020-02-08 NOTE — Telephone Encounter (Signed)
noted 

## 2020-02-08 NOTE — Telephone Encounter (Signed)
Patient called to cancel her sleep study no reason was given.

## 2020-02-23 ENCOUNTER — Other Ambulatory Visit: Payer: Self-pay | Admitting: Internal Medicine

## 2020-02-24 ENCOUNTER — Other Ambulatory Visit (HOSPITAL_COMMUNITY): Payer: Medicare Other

## 2020-02-27 ENCOUNTER — Encounter (HOSPITAL_BASED_OUTPATIENT_CLINIC_OR_DEPARTMENT_OTHER): Payer: Medicare Other | Admitting: Cardiology

## 2020-03-15 NOTE — Progress Notes (Signed)
Cardiology Office Note Date:  03/16/2020  Patient ID:  Darlene, Boyer [birth year], MRN [id number] PCP:  Lois Huxley, PA  Cardiologist:  DR. Percival Spanish EP: Dr. Caryl Comes   Chief Complaint:  planned f/u  History of Present Illness: Darlene Boyer is a 79 y.o. female with history of DM, HTN, arthritis, hypothyroidism, AFib , tachy-brady w/PPM  She comes in today to be seen for Dr. Caryl Comes.  Last seen by him 02/02/20.  She had mentioned palpitations that he suspected were PVCs observed, though she also had some PAF (largely rate controlled).  Significant 1st degree AVblock, though elected to keep MVP on to allow intrinsic conduction with narrow QRS.  Felt once her PR became >356ms would program MVP off for better av synchrony. Discussed perhaps event monitoring to correlate her symptoms and rhythm/ectopy better. Recommended sleep study for sleep disordered breathing.  (not yet done as far as I can tell)  She continues with palpitations though today says they are positional when laying on her side right or left, maybe more left.  Noting less if at all during the day.   She describes them as feeling her heart beat in her back/flank (opposite to the side she is laying on) and associated with physical motion of he body, the palpitations make her body move like something has pushed her slightly  No CP or SOB, no DOE, no dizzy spells, near syncope or syncope 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  Past Medical History:  Diagnosis Date  . Arthritis   . Atrial fibrillation ([place])   . Diabetes mellitus without complication ([place])   . Hypertension   . Obesity   . Thyroid disease     Past Surgical History:  Procedure Laterality Date  . ABDOMINAL HYSTERECTOMY     partial  . ANKLE FRACTURE SURGERY Right   . COLONOSCOPY  01/27/2020  . EYE SURGERY     Lens implants  . FOOT SURGERY Left    Pins in 3 toes  . GALLBLADDER SURGERY    .  PACEMAKER IMPLANT N/A 11/02/2019   Procedure: PACEMAKER IMPLANT;  Surgeon: Deboraha Sprang, MD;  Location: [place] [facility];  Service: Cardiovascular;  Laterality: N/A;  . TUBAL LIGATION    . UPPER GASTROINTESTINAL ENDOSCOPY  01/27/2020    Current Outpatient Medications  Medication Sig Dispense Refill  . dextromethorphan-guaiFENesin (MUCINEX DM) 30-600 MG 12hr tablet Take 1 tablet by mouth 2 (two) times daily.    . furosemide (LASIX) 20 MG tablet Take 20 mg by mouth.    . levothyroxine (SYNTHROID, LEVOTHROID) 100 MCG tablet Take 100 mcg by mouth daily before breakfast.    . loratadine (CLARITIN) 10 MG tablet Take 10 mg by mouth every other day.    . losartan (COZAAR) 50 MG tablet Take 50 mg by mouth daily.    . metFORMIN (GLUCOPHAGE) 1000 MG tablet Take 1,000 mg by mouth 2 (two) times daily with a meal.    . metoprolol succinate (TOPROL-XL) 100 MG 24 hr tablet Take 100 mg by mouth daily.  5  . Multiple Vitamins-Minerals (WOMENS 50+ MULTI VITAMIN/MIN PO) Take 1 tablet by mouth daily.    . pantoprazole (PROTONIX) 40 MG tablet Take 1 tablet (40 mg total) by mouth daily. NEEDS OFFICE VISIT FOR FURTHER REFILLS 30 tablet 0  . traZODone (DESYREL) 50 MG tablet Take 50 mg by mouth at bedtime as needed for sleep.     Marland Kitchen  XARELTO 20 MG TABS tablet TAKE 1 TABLET BY MOUTH DAILY WITH SUPPER 90 tablet 1   No current facility-administered medications for this visit.    Allergies:   Patient has no allergy information on record.   Social History:  The patient  reports that she has never smoked. Her smokeless tobacco use includes snuff. She reports that she does not drink alcohol or use drugs.   Family History:  The patient's family history includes Cancer in her mother; Emphysema in her father.  ROS:  Please see the history of present illness.  All other systems are reviewed and otherwise negative.   PHYSICAL EXAM:  VS:  BP (!) 164/78   Pulse 68   Ht 5\' 5"  (1.651 m)   Wt 232 lb (105.2 kg)   SpO2  97%   BMI 38.61 kg/m  BMI: Body mass index is 38.61 kg/m. Well nourished, well developed, in no acute distress  HEENT: normocephalic, atraumatic  Neck: no JVD, carotid bruits or masses Cardiac: RRR; no significant murmurs, no rubs, or gallops Lungs:  CTA b/l, no wheezing, rhonchi or rales  Abd: soft, nontender, obese MS: no deformity or atrophy Ext: trace edema  Skin: warm and dry, no rash Neuro:  No gross deficits appreciated Psych: euthymic mood, full affect  PPM site is stable, no tethering or discomfort   EKG:  Not done today  PPM interrogation done today  11/01/2019: TTE IMPRESSIONS  1. Left ventricular ejection fraction, by visual estimation, is 65 to  70%. The left ventricle has hyperdynamic function. There is moderately  increased left ventricular hypertrophy.  2. Left ventricular diastolic parameters are consistent with Grade II  diastolic dysfunction (pseudonormalization).  3. Global right ventricle has normal systolic function.The right  ventricular size is normal. No increase in right ventricular wall  thickness.  4. Left atrial size was moderately dilated.  5. Right atrial size was normal.  6. Moderate mitral annular calcification.  7. The mitral valve is normal in structure. Mild mitral valve  regurgitation. No evidence of mitral stenosis.  8. The tricuspid valve is normal in structure. Tricuspid valve  regurgitation moderate.  9. The aortic valve is normal in structure. Aortic valve regurgitation is  not visualized. No evidence of aortic valve sclerosis or stenosis.  10. The pulmonic valve was normal in structure. Pulmonic valve  regurgitation is not visualized.  11. Moderately elevated pulmonary artery systolic pressure.  12. The tricuspid regurgitant velocity is 3.56 m/s, and with an assumed  right atrial pressure of 8 mmHg, the estimated right ventricular systolic  pressure is moderately elevated at 58.7 mmHg.  13. The inferior vena cava is  normal in size with <50% respiratory  variability, suggesting right atrial pressure of 8 mmHg.   Recent Labs: 10/31/2019: Hemoglobin 10.3; Platelets 238 11/01/2019: BUN 24; Creatinine, Ser 1.14; Magnesium 2.1; Potassium 3.9; Sodium 139; TSH 2.963  No results found for requested labs within last 8760 hours.   CrCl cannot be calculated (Patient's most recent lab result is older than the maximum 21 days allowed.).   Wt Readings from Last 3 Encounters:  03/16/20 232 lb (105.2 kg)  02/02/20 237 lb (107.5 kg)  01/27/20 220 lb (99.8 kg)     Other studies reviewed: Additional studies/records reviewed today include: summarized above  ASSESSMENT AND PLAN:  1. PPM     Intact function, no programming changes made PR is 321ms VP3.1% only   2. Paroxysmal Afib     CHA2DS2Vasc is 5, on xarelto, appropriately  dosed     Less Afib then previously     4.8%   3. HTN     High initially recheck 148/78   4. Palpitations     Initially suspected by her description as perhaps diaphrag stim since she initially also said was new since pacer but pacing at high outputs did not reproduce, she she layed on her R she had none, though laying on her left provoked intermittent PVCs and reproduced her palpitations.   She cancelled her sleep study 2/2 transportation problems, she is urged to follow up and get this done.  Recommend she try a position to sleep that does not seem to provoke her PVCs.  Would like to see if she has OSA (suspected) and if treated if they improve.   Disposition: F/u with with Q 3 mo remotes and in clinic in 69mo to revisit her palpitations, symptoms, sleep apnea.   Current medicines are reviewed at length with the patient today.  The patient did not have any concerns regarding medicines.  Venetia Night, PA-C 03/16/2020 3:34 PM     [address] [place] [address] [phone] (office)  [phone] (fax)

## 2020-03-16 ENCOUNTER — Ambulatory Visit: Payer: Medicare Other | Admitting: Physician Assistant

## 2020-03-16 ENCOUNTER — Other Ambulatory Visit: Payer: Self-pay

## 2020-03-16 ENCOUNTER — Other Ambulatory Visit: Payer: Self-pay | Admitting: Physician Assistant

## 2020-03-16 VITALS — BP 164/78 | HR 68 | Ht 65.0 in | Wt 232.0 lb

## 2020-03-16 DIAGNOSIS — R001 Bradycardia, unspecified: Secondary | ICD-10-CM

## 2020-03-16 DIAGNOSIS — Z950 Presence of cardiac pacemaker: Secondary | ICD-10-CM

## 2020-03-16 DIAGNOSIS — M5442 Lumbago with sciatica, left side: Secondary | ICD-10-CM

## 2020-03-16 DIAGNOSIS — I493 Ventricular premature depolarization: Secondary | ICD-10-CM | POA: Diagnosis not present

## 2020-03-16 DIAGNOSIS — I480 Paroxysmal atrial fibrillation: Secondary | ICD-10-CM | POA: Diagnosis not present

## 2020-03-16 DIAGNOSIS — I4892 Unspecified atrial flutter: Secondary | ICD-10-CM

## 2020-03-16 DIAGNOSIS — I10 Essential (primary) hypertension: Secondary | ICD-10-CM

## 2020-03-16 DIAGNOSIS — M79605 Pain in left leg: Secondary | ICD-10-CM

## 2020-03-16 NOTE — Patient Instructions (Signed)
Medication Instructions:   Your physician recommends that you continue on your current medications as directed. Please refer to the Current Medication list given to you today.  *If you need a refill on your cardiac medications before your next appointment, please call your pharmacy*   Lab Work: [place]   If you have labs (blood work) drawn today and your tests are completely normal, you will receive your results only by: Marland Kitchen MyChart Message (if you have MyChart) OR . A paper copy in the mail If you have any lab test that is abnormal or we need to change your treatment, we will call you to review the results.   Testing/Procedures: NONE ORDERED  TODAY   Follow-Up: At [facility], you and your health needs are our priority.  As part of our continuing mission to provide you with exceptional heart care, we have created designated Provider Care Teams.  These Care Teams include your primary Cardiologist (physician) and Advanced Practice Providers (APPs -  Physician Assistants and Nurse Practitioners) who all work together to provide you with the care you need, when you need it.  We recommend signing up for the patient portal called "MyChart".  Sign up information is provided on this After Visit Summary.  MyChart is used to connect with patients for Virtual Visits (Telemedicine).  Patients are able to view lab/test results, encounter notes, upcoming appointments, etc.  Non-urgent messages can be sent to your provider as well.   To learn more about what you can do with MyChart, go to [email or web address].    Your next appointment:   2 month(s)  The format for your next appointment:   In Person  Provider:   You may see  or one of the following Advanced Practice Providers on your designated Care Team:    Chanetta Marshall, NP  Tommye Standard, PA-C  Legrand Como "Oda Kilts, [place]    Other Instructions

## 2020-03-21 ENCOUNTER — Other Ambulatory Visit: Payer: Self-pay | Admitting: Internal Medicine

## 2020-03-22 ENCOUNTER — Other Ambulatory Visit: Payer: Self-pay | Admitting: Physician Assistant

## 2020-03-22 DIAGNOSIS — M5442 Lumbago with sciatica, left side: Secondary | ICD-10-CM

## 2020-04-12 ENCOUNTER — Telehealth: Payer: Self-pay | Admitting: Internal Medicine

## 2020-04-12 NOTE — Telephone Encounter (Signed)
Patient notified that is ok to have CT scan with a pacemaker or defibrillator.

## 2020-04-12 NOTE — Telephone Encounter (Signed)
Patient's PCP would like for her to have a CT scan. Patient wants to make sure it is okay for her to have this done with a pace maker.

## 2020-04-13 ENCOUNTER — Other Ambulatory Visit: Payer: Medicare Other

## 2020-04-15 ENCOUNTER — Encounter: Payer: Self-pay | Admitting: Internal Medicine

## 2020-04-22 ENCOUNTER — Other Ambulatory Visit: Payer: Self-pay | Admitting: Physician Assistant

## 2020-04-22 DIAGNOSIS — M5442 Lumbago with sciatica, left side: Secondary | ICD-10-CM

## 2020-04-27 ENCOUNTER — Ambulatory Visit
Admission: RE | Admit: 2020-04-27 | Discharge: 2020-04-27 | Disposition: A | Discharge status: Home / Self Care | Payer: Medicare Other | Source: Ambulatory Visit | Attending: Physician Assistant | Admitting: Physician Assistant

## 2020-04-27 DIAGNOSIS — M5442 Lumbago with sciatica, left side: Secondary | ICD-10-CM

## 2020-05-02 LAB — CUP PACEART REMOTE DEVICE CHECK
Battery Remaining Longevity: 164 mo
Battery Voltage: 3.2 V
Brady Statistic AP VP Percent: 0.25 %
Brady Statistic AP VS Percent: 74.24 %
Brady Statistic AS VP Percent: 0.22 %
Brady Statistic AS VS Percent: 25.29 %
Brady Statistic RA Percent Paced: 75.12 %
Brady Statistic RV Percent Paced: 1.25 %
Date Time Interrogation Session: 20210607065936
Implantable Lead Implant Date: 20201207
Implantable Lead Location: 753859
Implantable Lead Location: 753860
Implantable Lead Model: 4076
Implantable Lead Model: 4076
Implantable Pulse Generator Implant Date: 20201207
Lead Channel Impedance Value: 342 Ohm
Lead Channel Impedance Value: 437 Ohm
Lead Channel Impedance Value: 551 Ohm
Lead Channel Impedance Value: 589 Ohm
Lead Channel Pacing Threshold Amplitude: 0.5 V
Lead Channel Pacing Threshold Amplitude: 0.5 V
Lead Channel Pacing Threshold Pulse Width: 0.4 ms
Lead Channel Pacing Threshold Pulse Width: 0.4 ms
Lead Channel Sensing Intrinsic Amplitude: 0.25 mV
Lead Channel Sensing Intrinsic Amplitude: 0.25 mV
Lead Channel Sensing Intrinsic Amplitude: 18.5 mV
Lead Channel Sensing Intrinsic Amplitude: 18.5 mV
Lead Channel Setting Pacing Amplitude: 1.5 V
Lead Channel Setting Pacing Amplitude: 2.5 V
Lead Channel Setting Pacing Pulse Width: 0.4 ms
Lead Channel Setting Sensing Sensitivity: 0.9 mV

## 2020-05-04 NOTE — Progress Notes (Unsigned)
Remote pacemaker transmission.   

## 2020-05-16 DIAGNOSIS — I4891 Unspecified atrial fibrillation: Secondary | ICD-10-CM | POA: Insufficient documentation

## 2020-05-16 DIAGNOSIS — I440 Atrioventricular block, first degree: Secondary | ICD-10-CM | POA: Insufficient documentation

## 2020-05-16 DIAGNOSIS — R002 Palpitations: Secondary | ICD-10-CM | POA: Insufficient documentation

## 2020-05-16 DIAGNOSIS — Z950 Presence of cardiac pacemaker: Secondary | ICD-10-CM | POA: Insufficient documentation

## 2020-05-17 ENCOUNTER — Telehealth: Payer: Self-pay | Admitting: Internal Medicine

## 2020-05-17 ENCOUNTER — Encounter: Payer: Medicare Other | Admitting: Internal Medicine

## 2020-06-07 ENCOUNTER — Encounter: Payer: Self-pay | Admitting: Student

## 2020-06-07 ENCOUNTER — Other Ambulatory Visit: Payer: Self-pay

## 2020-06-07 ENCOUNTER — Ambulatory Visit: Payer: Medicare Other | Admitting: Student

## 2020-06-07 VITALS — BP 130/70 | HR 72 | Ht 65.0 in | Wt 220.0 lb

## 2020-06-07 DIAGNOSIS — I10 Essential (primary) hypertension: Secondary | ICD-10-CM | POA: Diagnosis not present

## 2020-06-07 DIAGNOSIS — I480 Paroxysmal atrial fibrillation: Secondary | ICD-10-CM | POA: Diagnosis not present

## 2020-06-07 DIAGNOSIS — Z950 Presence of cardiac pacemaker: Secondary | ICD-10-CM

## 2020-06-07 DIAGNOSIS — I493 Ventricular premature depolarization: Secondary | ICD-10-CM

## 2020-06-07 DIAGNOSIS — G4730 Sleep apnea, unspecified: Secondary | ICD-10-CM | POA: Diagnosis not present

## 2020-06-07 DIAGNOSIS — R001 Bradycardia, unspecified: Secondary | ICD-10-CM

## 2020-06-07 LAB — CUP PACEART INCLINIC DEVICE CHECK
Battery Remaining Longevity: 162 mo
Battery Voltage: 3.18 V
Brady Statistic AP VP Percent: 0.25 %
Brady Statistic AP VS Percent: 78.79 %
Brady Statistic AS VP Percent: 0.2 %
Brady Statistic AS VS Percent: 20.76 %
Brady Statistic RA Percent Paced: 77.44 %
Brady Statistic RV Percent Paced: 2.04 %
Date Time Interrogation Session: 20210713122535
Implantable Lead Implant Date: 20201207
Implantable Lead Location: 753859
Implantable Lead Location: 753860
Implantable Lead Model: 4076
Implantable Lead Model: 4076
Implantable Pulse Generator Implant Date: 20201207
Lead Channel Impedance Value: 323 Ohm
Lead Channel Impedance Value: 399 Ohm
Lead Channel Impedance Value: 570 Ohm
Lead Channel Impedance Value: 608 Ohm
Lead Channel Pacing Threshold Amplitude: 0.5 V
Lead Channel Pacing Threshold Amplitude: 0.625 V
Lead Channel Pacing Threshold Pulse Width: 0.4 ms
Lead Channel Pacing Threshold Pulse Width: 0.4 ms
Lead Channel Sensing Intrinsic Amplitude: 21.75 mV
Lead Channel Sensing Intrinsic Amplitude: 22.25 mV
Lead Channel Sensing Intrinsic Amplitude: 3.25 mV
Lead Channel Sensing Intrinsic Amplitude: 3.375 mV
Lead Channel Setting Pacing Amplitude: 1.5 V
Lead Channel Setting Pacing Amplitude: 2.5 V
Lead Channel Setting Pacing Pulse Width: 0.4 ms
Lead Channel Setting Sensing Sensitivity: 0.9 mV

## 2020-06-07 NOTE — Patient Instructions (Signed)
Medication Instructions:  *If you need a refill on your cardiac medications before your next appointment, please call your pharmacy*  Lab Work: Your physician has recommended that you have lab work today: BMET and CBC If you have labs (blood work) drawn today and your tests are completely normal, you will receive your results only by: Marland Kitchen MyChart Message (if you have MyChart) OR . A paper copy in the mail If you have any lab test that is abnormal or we need to change your treatment, we will call you to review the results.   Testing/Procedures: Your physician has recommended that you have a home sleep study. This test records several body functions during sleep, including: brain activity, eye movement, oxygen and carbon dioxide blood levels, heart rate and rhythm, breathing rate and rhythm, the flow of air through your mouth and nose, snoring, body muscle movements, and chest and belly movement.  Follow-Up: At [facility], you and your health needs are our priority.  As part of our continuing mission to provide you with exceptional heart care, we have created designated Provider Care Teams.  These Care Teams include your primary Cardiologist (physician) and Advanced Practice Providers (APPs -  Physician Assistants and Nurse Practitioners) who all work together to provide you with the care you need, when you need it.  We recommend signing up for the patient portal called "MyChart".  Sign up information is provided on this After Visit Summary.  MyChart is used to connect with patients for Virtual Visits (Telemedicine).  Patients are able to view lab/test results, encounter notes, upcoming appointments, etc.  Non-urgent messages can be sent to your provider as well.   To learn more about what you can do with MyChart, go to [email or web address].    Your next appointment:   Your physician wants you to follow-up in: 6 MONTHS with Dr. Caryl Comes.  You will receive a reminder letter in the mail two months  in advance. If you don't receive a letter, please call our office to schedule the follow-up appointment.   The format for your next appointment:   In Person with Virl Axe, MD

## 2020-06-07 NOTE — Progress Notes (Signed)
Patient Name: Darlene Boyer  DOB: 1941/08/16 Height: 5'5" Weight:  Last Weight  Most recent update: 06/07/2020 11:55 AM   Weight  99.8 kg (220 lb)            Today's Date: 06/07/20   STOP BANG RISK ASSESSMENT  S(snore) Have you been told that your snore?  She is not sure if she snores T(tired) Are you often tired, fatigued, or sleepy during the day? O(obstruction) Do you stop breathing, choke or gasp during sleep? P(pressure) Do you have or are you being treated for high blood pressure? B(BMI) Is your body index greater than 35 kg/m? Body mass index is 36.61 kg/m.  A(age) Are you 57 years old or older? 79 y.o. N(neck) Do you have a neck circumference greater than 40 cm?  35 cm G(gender) Are you a female? female  Stop Bang of 4, including BMI > 35.  Clinical Notes: Will consult Sleep Specialist and refer for management of therapy due to patient increased risk of Sleep Apnea. Ordering a sleep study due to the following clinical symptoms: Excessive daytime sleepiness G47.10 / Gastroesphageal reflux K21.9 / Nocturia R35.1 / Morning Headaches G44.21 / Difficulty concentrating R41.840 / Memory problems or poor judgement G31.84 / Personality changes or irritability R45.4 / Loud snoring R06.83 / Depression F32.9 / Unrefreshed by sleep G47.8 / Impotence N52.9 / History of high blood pressure R03.0 / Insomnia G47.00  We will order home sleep study due to transportation issues.

## 2020-06-07 NOTE — Progress Notes (Signed)
Electrophysiology Office Note Date: 06/07/2020  ID:  Darlene Boyer, DOB 01-27-1941, MRN [id number]  PCP: Lois Huxley, PA Primary Cardiologist: Minus Breeding, MD Electrophysiologist: Virl Axe, MD   CC: Pacemaker follow-up  Darlene Boyer is a 79 y.o. female seen today for Virl Axe, MD for routine electrophysiology followup.  Since last being seen in our clinic the patient reports doing well. She still has intermittent palpitations in the quiet evenings, while laying in bed. These are not distressing.  she denies chest pain, dyspnea, PND, orthopnea, nausea, vomiting, dizziness, syncope, edema, weight gain, or early satiety.  Device History: Medtronic Dual Chamber PPM implanted 11/02/2019 for tachy-brady  Past Medical History:  Diagnosis Date   Arthritis    Atrial fibrillation ([place])    Diabetes mellitus without complication ([place])    Hypertension    Obesity    Thyroid disease    Past Surgical History:  Procedure Laterality Date   ABDOMINAL HYSTERECTOMY     partial   ANKLE FRACTURE SURGERY Right    COLONOSCOPY  01/27/2020   EYE SURGERY     Lens implants   FOOT SURGERY Left    Pins in 3 toes   GALLBLADDER SURGERY     PACEMAKER IMPLANT N/A 11/02/2019   Procedure: PACEMAKER IMPLANT;  Surgeon: Deboraha Sprang, MD;  Location: [facility];  Service: Cardiovascular;  Laterality: N/A;   TUBAL LIGATION     UPPER GASTROINTESTINAL ENDOSCOPY  01/27/2020    Current Outpatient Medications  Medication Sig Dispense Refill   dextromethorphan-guaiFENesin (MUCINEX DM) 30-600 MG 12hr tablet Take 1 tablet by mouth as needed for cough.      furosemide (LASIX) 20 MG tablet Take 20 mg by mouth.     levothyroxine (SYNTHROID, LEVOTHROID) 100 MCG tablet Take 100 mcg by mouth daily before breakfast.     loratadine (CLARITIN) 10 MG tablet Take 10 mg by mouth every other day.     losartan (COZAAR) 50 MG tablet Take 50 mg by mouth daily.     metFORMIN  (GLUCOPHAGE) 1000 MG tablet Take 1,500 mg by mouth daily.      metoprolol succinate (TOPROL-XL) 100 MG 24 hr tablet Take 100 mg by mouth daily.  5   montelukast (SINGULAIR) 10 MG tablet Take 10 mg by mouth daily.     Multiple Vitamins-Minerals (WOMENS 50+ MULTI VITAMIN/MIN PO) Take 1 tablet by mouth daily.     pantoprazole (PROTONIX) 40 MG tablet Take 40 mg by mouth as needed.     traZODone (DESYREL) 50 MG tablet Take 50 mg by mouth at bedtime as needed for sleep.      XARELTO 20 MG TABS tablet TAKE 1 TABLET BY MOUTH DAILY WITH SUPPER 90 tablet 1   No current facility-administered medications for this visit.    Allergies:   Patient has no allergy information on record.   Social History: Social History   Socioeconomic History   Marital status: Widowed    Spouse name: Not on file   Number of children: 5   Years of education: Not on file   Highest education level: Not on file  Occupational History   Occupation: retired  Tobacco Use   Smoking status: Never Smoker   Smokeless tobacco: Current User    Types: Snuff   Tobacco comment: "every now and then"  Vaping Use   Vaping Use: Never used  Substance and Sexual Activity   Alcohol use: No    Alcohol/week: 0.0 standard drinks  Drug use: No   Sexual activity: Not on file  Other Topics Concern   Not on file  Social History Narrative   Lives at home with husband.    Social Determinants of Health   Financial Resource Strain:    Difficulty of Paying Living Expenses:   Food Insecurity:    Worried About Charity fundraiser in the Last Year:    Arboriculturist in the Last Year:   Transportation Needs:    Film/video editor (Medical):    Lack of Transportation (Non-Medical):   Physical Activity:    Days of Exercise per Week:    Minutes of Exercise per Session:   Stress:    Feeling of Stress :   Social Connections:    Frequency of Communication with Friends and Family:    Frequency of Social  Gatherings with Friends and Family:    Attends Religious Services:    Active Member of Clubs or Organizations:    Attends Music therapist:    Marital Status:   Intimate Partner Violence:    Fear of Current or Ex-Partner:    Emotionally Abused:    Physically Abused:    Sexually Abused:     Family History: Family History  Problem Relation Age of Onset   Emphysema Father    Cancer Mother        told lung and breast   Colon cancer Neg Hx    Esophageal cancer Neg Hx    Rectal cancer Neg Hx    Stomach cancer Neg Hx      Review of Systems: All other systems reviewed and are otherwise negative except as noted above.  Physical Exam: Vitals:   06/07/20 1153  BP: 130/70  Pulse: 72  SpO2: 97%  Weight: 220 lb (99.8 kg)  Height: 5\' 5"  (1.651 m)     GEN- The patient is well appearing, alert and oriented x 3 today.   HEENT: normocephalic, atraumatic; sclera clear, conjunctiva pink; hearing intact; oropharynx clear; neck supple  Lungs- Clear to ausculation bilaterally, normal work of breathing.  No wheezes, rales, rhonchi Heart- Regular rate and rhythm, no murmurs, rubs or gallops  GI- soft, non-tender, non-distended, bowel sounds present  Extremities- no clubbing, cyanosis, or edema  MS- no significant deformity or atrophy Skin- warm and dry, no rash or lesion; PPM pocket well healed Psych- euthymic mood, full affect Neuro- strength and sensation are intact  PPM Interrogation- reviewed in detail today,  See PACEART report  EKG:  EKG is not ordered today.  Recent Labs: 10/31/2019: Hemoglobin 10.3; Platelets 238 11/01/2019: BUN 24; Creatinine, Ser 1.14; Magnesium 2.1; Potassium 3.9; Sodium 139; TSH 2.963   Wt Readings from Last 3 Encounters:  06/07/20 220 lb (99.8 kg)  03/16/20 232 lb (105.2 kg)  02/02/20 237 lb (107.5 kg)     Other studies Reviewed: Additional studies/ records that were reviewed today include: Previous EP office notes, Previous  remote checks, Most recent labwork.   Assessment and Plan:  1. Tachy-Brady syndrome s/p Medtronic PPM  Normal PPM function See Pace Art report No changes today  2. Paroxysmal Afib Continue Xarelto for CHA2DS2VASC of at least 5   Overall low burden at ~4%  3. HTN Continue current medications.   4. Palpitations Intermittent. Thought to be related to PVCs Not able to replicate at high pacing outputs.   5. Suspected sleep apnea STOPBANG is 4, with one item being BMI > 35, putting her in the high  risk category for sleep apnea. With daytime fatigue and HTN, we will proceed with OSA testing.  With transportation issues, we will opt for home testing.    Current medicines are reviewed at length with the patient today.   The patient does not have concerns regarding her medicines.  The following changes were made today:  none  Labs/ tests ordered today include:  Orders Placed This Encounter  Procedures   Basic Metabolic Panel (BMET)   CBC w/Diff   CUP [place] sleep test     Disposition:   Follow up with Dr. Caryl Comes in 6 Months    Signed, Annamaria Helling  06/07/2020 12:31 PM  [facility] [address] [place] [address] [phone] (office) [phone] (fax)

## 2020-06-08 LAB — CBC WITH DIFFERENTIAL/PLATELET
Basophils Absolute: 0 10*3/uL (ref 0.0–0.2)
Basos: 1 %
EOS (ABSOLUTE): 0.1 10*3/uL (ref 0.0–0.4)
Eos: 1 %
Hematocrit: 32.6 % — ABNORMAL LOW (ref 34.0–46.6)
Hemoglobin: 10.6 g/dL — ABNORMAL LOW (ref 11.1–15.9)
Immature Grans (Abs): 0 10*3/uL (ref 0.0–0.1)
Immature Granulocytes: 0 %
Lymphocytes Absolute: 2.1 10*3/uL (ref 0.7–3.1)
Lymphs: 35 %
MCH: 29.5 pg (ref 26.6–33.0)
MCHC: 32.5 g/dL (ref 31.5–35.7)
MCV: 91 fL (ref 79–97)
Monocytes Absolute: 0.5 10*3/uL (ref 0.1–0.9)
Monocytes: 8 %
Neutrophils Absolute: 3.3 10*3/uL (ref 1.4–7.0)
Neutrophils: 55 %
Platelets: 197 10*3/uL (ref 150–450)
RBC: 3.59 x10E6/uL — ABNORMAL LOW (ref 3.77–5.28)
RDW: 13.8 % (ref 11.7–15.4)
WBC: 6 10*3/uL (ref 3.4–10.8)

## 2020-06-08 LAB — BASIC METABOLIC PANEL
BUN/Creatinine Ratio: 12 (ref 12–28)
BUN: 11 mg/dL (ref 8–27)
CO2: 29 mmol/L (ref 20–29)
Calcium: 8.9 mg/dL (ref 8.7–10.3)
Chloride: 99 mmol/L (ref 96–106)
Creatinine, Ser: 0.94 mg/dL (ref 0.57–1.00)
GFR calc Af Amer: 68 mL/min/{1.73_m2} (ref >59)
GFR calc non Af Amer: 59 mL/min/{1.73_m2} — ABNORMAL LOW (ref >59)
Glucose: 143 mg/dL — ABNORMAL HIGH (ref 65–99)
Potassium: 4 mmol/L (ref 3.5–5.2)
Sodium: 145 mmol/L — ABNORMAL HIGH (ref 134–144)

## 2020-07-11 ENCOUNTER — Other Ambulatory Visit: Payer: Self-pay | Admitting: Cardiology

## 2020-07-13 DIAGNOSIS — E118 Type 2 diabetes mellitus with unspecified complications: Secondary | ICD-10-CM | POA: Insufficient documentation

## 2020-07-13 DIAGNOSIS — Z7189 Other specified counseling: Secondary | ICD-10-CM | POA: Insufficient documentation

## 2020-07-13 NOTE — Progress Notes (Signed)
Cardiology Office Note   Date:  07/17/2020   ID:  Darlene Boyer, Darlene Boyer 03-24-1941, MRN [id number]  PCP:  Lois Huxley, PA  Cardiologist:   Minus Breeding, MD   Chief Complaint  Patient presents with  . Atrial Fibrillation      History of Present Illness: Darlene Boyer is a 79 y.o. female who presents for evaluation of atrial fibrillation.  She had a pacemaker in Dec of last year for tachybrady syndrome.  Since I last saw her she has done well.  She has lost about 37 pounds through diet.  She feels less short of breath.  She is not having any palpitations, presyncope or syncope.  She has no PND or orthopnea.    She is walking for exercise.  With this she thinks her breathing is better than it was previously.   Past Medical History:  Diagnosis Date  . Arthritis   . Atrial fibrillation ([place])   . Diabetes mellitus without complication ([place])   . Hypertension   . Obesity   . Thyroid disease     Past Surgical History:  Procedure Laterality Date  . ABDOMINAL HYSTERECTOMY     partial  . ANKLE FRACTURE SURGERY Right   . COLONOSCOPY  01/27/2020  . EYE SURGERY     Lens implants  . FOOT SURGERY Left    Pins in 3 toes  . GALLBLADDER SURGERY    . PACEMAKER IMPLANT N/A 11/02/2019   Procedure: PACEMAKER IMPLANT;  Surgeon: Deboraha Sprang, MD;  Location: [facility];  Service: Cardiovascular;  Laterality: N/A;  . TUBAL LIGATION    . UPPER GASTROINTESTINAL ENDOSCOPY  01/27/2020     Current Outpatient Medications  Medication Sig Dispense Refill  . dextromethorphan-guaiFENesin (MUCINEX DM) 30-600 MG 12hr tablet Take 1 tablet by mouth as needed for cough.     . furosemide (LASIX) 20 MG tablet Take 20 mg by mouth.    . levothyroxine (SYNTHROID, LEVOTHROID) 100 MCG tablet Take 100 mcg by mouth daily before breakfast.    . loratadine (CLARITIN) 10 MG tablet Take 10 mg by mouth every other day.    . losartan (COZAAR) 50 MG tablet Take 50 mg by mouth daily.    .  metFORMIN (GLUCOPHAGE) 1000 MG tablet Take 1,500 mg by mouth daily.     . metoprolol succinate (TOPROL-XL) 100 MG 24 hr tablet Take 100 mg by mouth daily.  5  . montelukast (SINGULAIR) 10 MG tablet Take 10 mg by mouth daily.    . Multiple Vitamins-Minerals (WOMENS 50+ MULTI VITAMIN/MIN PO) Take 1 tablet by mouth daily.    . pantoprazole (PROTONIX) 40 MG tablet Take 40 mg by mouth as needed.    . traZODone (DESYREL) 50 MG tablet Take 50 mg by mouth at bedtime as needed for sleep.     Alveda Reasons 20 MG TABS tablet TAKE 1 TABLET BY MOUTH DAILY WITH SUPPER 90 tablet 1   No current facility-administered medications for this visit.    Allergies:   Patient has no allergy information on record.   ROS:  Please see the history of present illness.   Otherwise, review of systems are positive for none.   PHYSICAL EXAM: VS:  BP 140/86   Pulse 72   Temp (!) 97.5 F (36.4 C)   Ht 5\' 5"  (1.651 m)   Wt 213 lb (96.6 kg)   SpO2 93%   BMI 35.45 kg/m  , BMI Body mass index  is 35.45 kg/m.  GENERAL:  Well appearing NECK:  Positive jugular venous distention, waveform within normal limits, carotid upstroke brisk and symmetric, no bruits, no thyromegaly LUNGS:  Clear to auscultation bilaterally CHEST:  Well healed pacemaker pocket.   HEART:  PMI not displaced or sustained,S1 and S2 within normal limits, no S3, no S4, no clicks, no rubs, no murmurs ABD:  Flat, positive bowel sounds normal in frequency in pitch, no bruits, no rebound, no guarding, no midline pulsatile mass, no hepatomegaly, no splenomegaly EXT:  2 plus pulses throughout, no edema, no cyanosis no clubbing  EKG:  EKG is  ordered today. Atrial fibrillation, rate 72.  Ventricular pacing with 100% capture  Recent Labs: 11/01/2019: Magnesium 2.1; TSH 2.963 06/07/2020: BUN 11; Creatinine, Ser 0.94; Hemoglobin 10.6; Platelets 197; Potassium 4.0; Sodium 145    Lipid Panel No results found for: CHOL, TRIG, HDL, CHOLHDL, VLDL, LDLCALC, LDLDIRECT     Wt Readings from Last 3 Encounters:  07/14/20 213 lb (96.6 kg)  06/07/20 220 lb (99.8 kg)  03/16/20 232 lb (105.2 kg)      Other studies Reviewed: Additional studies/ records that were reviewed today include:  Hospital records Review of the above records demonstrates:   See above   ASSESSMENT AND PLAN:  ATRIAL FIB/FLUTTER:   [name] has a CHA2DS2 - VASc score of 6 .    She tolerates anticoagulation.  No change in therapy.  She has not had any symptomatic paroxysms of palpitations.   HTN:  The blood pressure is upper limits of normal but this is unusual for her.  No change in therapy.  At target.  No change in therapy.   DM:  A1C is 7.5 which is down from 8.7.  She is doing an excellent job with diet and activity and I encouraged more the same. I will defer to Lois Huxley, PA   BRADYCARDIA: She is now status post pacemaker and is followed by EP.  PULMONARY HTN: She has elevated pulmonary pressures moderately with some left ventricular hypertrophy possibly related to her longstanding hypertension.  I will repeat an echocardiogram in December.  COVID EDUCATION: She has been vaccinated.  Current medicines are reviewed at length with the patient today.  The patient does not have concerns regarding medicines.  The following changes have been made:  None  Labs/ tests ordered today include: None  Orders Placed This Encounter  Procedures  . EKG 12-Lead  . ECHOCARDIOGRAM COMPLETE     Disposition:   FU with me in  12  months.     Signed, Minus Breeding, MD  07/17/2020 12:06 PM    [employer]

## 2020-07-14 ENCOUNTER — Ambulatory Visit: Payer: Medicare Other | Admitting: Cardiology

## 2020-07-14 ENCOUNTER — Encounter: Payer: Self-pay | Admitting: Cardiology

## 2020-07-14 ENCOUNTER — Other Ambulatory Visit: Payer: Self-pay

## 2020-07-14 VITALS — BP 140/86 | HR 72 | Temp 97.5°F | Ht 65.0 in | Wt 213.0 lb

## 2020-07-14 DIAGNOSIS — I4892 Unspecified atrial flutter: Secondary | ICD-10-CM

## 2020-07-14 DIAGNOSIS — I10 Essential (primary) hypertension: Secondary | ICD-10-CM

## 2020-07-14 DIAGNOSIS — R001 Bradycardia, unspecified: Secondary | ICD-10-CM | POA: Diagnosis not present

## 2020-07-14 DIAGNOSIS — E118 Type 2 diabetes mellitus with unspecified complications: Secondary | ICD-10-CM | POA: Diagnosis not present

## 2020-07-14 DIAGNOSIS — Z7189 Other specified counseling: Secondary | ICD-10-CM

## 2020-07-14 DIAGNOSIS — I272 Pulmonary hypertension, unspecified: Secondary | ICD-10-CM

## 2020-07-14 NOTE — Patient Instructions (Signed)
Medication Instructions:  No changes *If you need a refill on your cardiac medications before your next appointment, please call your pharmacy*  Lab Work: None ordered this visit  Testing/Procedures: Your physician has requested that you have an echocardiogram in December. Echocardiography is a painless test that uses sound waves to create images of your heart. It provides your doctor with information about the size and shape of your heart and how well your heart's chambers and valves are working. This procedure takes approximately one hour. There are no restrictions for this procedure. [address]  Follow-Up: At [facility], you and your health needs are our priority.  As part of our continuing mission to provide you with exceptional heart care, we have created designated Provider Care Teams.  These Care Teams include your primary Cardiologist (physician) and Advanced Practice Providers (APPs -  Physician Assistants and Nurse Practitioners) who all work together to provide you with the care you need, when you need it.  We recommend signing up for the patient portal called "MyChart".  Sign up information is provided on this After Visit Summary.  MyChart is used to connect with patients for Virtual Visits (Telemedicine).  Patients are able to view lab/test results, encounter notes, upcoming appointments, etc.  Non-urgent messages can be sent to your provider as well.   To learn more about what you can do with MyChart, go to [email or web address].    Your next appointment:   12 month(s)  You will receive a reminder letter in the mail two months in advance. If you don't receive a letter, please call our office to schedule the follow-up appointment.  The format for your next appointment:   In Person  Provider:   Minus Breeding, MD

## 2020-07-15 ENCOUNTER — Telehealth: Payer: Self-pay | Admitting: Cardiology

## 2020-07-15 NOTE — Telephone Encounter (Signed)
Left message for patient to call and schedule Echo in December ordered by Dr. Percival Spanish

## 2020-07-17 ENCOUNTER — Encounter: Payer: Self-pay | Admitting: Cardiology

## 2020-07-18 NOTE — Telephone Encounter (Signed)
Left message for patient to call and schedule Echo in December ordered by Dr. Percival Spanish

## 2020-07-28 NOTE — Telephone Encounter (Signed)
Left message for patient to call and schedule Echo in December ordered by Dr. Percival Spanish

## 2020-08-03 ENCOUNTER — Ambulatory Visit (INDEPENDENT_AMBULATORY_CARE_PROVIDER_SITE_OTHER): Payer: Medicare Other | Admitting: *Deleted

## 2020-08-03 DIAGNOSIS — R001 Bradycardia, unspecified: Secondary | ICD-10-CM

## 2020-08-03 LAB — CUP PACEART REMOTE DEVICE CHECK
Battery Remaining Longevity: 160 mo
Battery Voltage: 3.16 V
Brady Statistic AP VP Percent: 0.59 %
Brady Statistic AP VS Percent: 89.02 %
Brady Statistic AS VP Percent: 0.2 %
Brady Statistic AS VS Percent: 10.17 %
Brady Statistic RA Percent Paced: 50.23 %
Brady Statistic RV Percent Paced: 19.51 %
Date Time Interrogation Session: 20210907213917
Implantable Lead Implant Date: 20201207
Implantable Lead Location: 753859
Implantable Lead Location: 753860
Implantable Lead Model: 4076
Implantable Lead Model: 4076
Implantable Pulse Generator Implant Date: 20201207
Lead Channel Impedance Value: 323 Ohm
Lead Channel Impedance Value: 418 Ohm
Lead Channel Impedance Value: 608 Ohm
Lead Channel Impedance Value: 646 Ohm
Lead Channel Pacing Threshold Amplitude: 0.5 V
Lead Channel Pacing Threshold Amplitude: 0.5 V
Lead Channel Pacing Threshold Pulse Width: 0.4 ms
Lead Channel Pacing Threshold Pulse Width: 0.4 ms
Lead Channel Sensing Intrinsic Amplitude: 23.375 mV
Lead Channel Sensing Intrinsic Amplitude: 23.375 mV
Lead Channel Sensing Intrinsic Amplitude: 3.875 mV
Lead Channel Sensing Intrinsic Amplitude: 3.875 mV
Lead Channel Setting Pacing Amplitude: 1.5 V
Lead Channel Setting Pacing Amplitude: 2.5 V
Lead Channel Setting Pacing Pulse Width: 0.4 ms
Lead Channel Setting Sensing Sensitivity: 0.9 mV

## 2020-08-03 NOTE — Telephone Encounter (Signed)
Scheduled remote received 08/02/20 for persistent AF w/ controlled VR.  Longest episode 26 hours.  AT/AF burden increased to 46.6%. Last OV w/ A. Tillery, PA, around 4%.  Called patient to assess, medication compliance (Lasix 20 mg daily, Toprol-XL 100 mg daily, xarelto 20 mg daily). No answer. Called both phone numbers on file, unable to leave VM on either.   Routing to pool to follow-up.

## 2020-08-04 NOTE — Progress Notes (Signed)
Remote pacemaker transmission.   

## 2020-08-12 NOTE — Telephone Encounter (Signed)
Left message for patient to call and schedule Echocardiogram ordered by Dr. Percival Spanish

## 2020-08-16 ENCOUNTER — Encounter: Payer: Self-pay | Admitting: Cardiology

## 2020-08-18 ENCOUNTER — Telehealth: Payer: Self-pay

## 2020-08-18 NOTE — Telephone Encounter (Signed)
Following up with.  No answer, LMOVM.

## 2020-08-18 NOTE — Telephone Encounter (Signed)
Patient returning call.

## 2020-08-18 NOTE — Telephone Encounter (Signed)
Returning patients phone call. Patient denies any symptoms or changes to how she feels. Patient is compliant with medications. Patient advised I will forward to Dr. Caryl Comes for review. We will contact her with any changes. Patient agreeable to plan.

## 2020-08-24 ENCOUNTER — Other Ambulatory Visit: Payer: Self-pay

## 2020-08-24 ENCOUNTER — Ambulatory Visit (HOSPITAL_COMMUNITY): Payer: Medicare Other | Attending: Internal Medicine

## 2020-08-24 DIAGNOSIS — I272 Pulmonary hypertension, unspecified: Secondary | ICD-10-CM | POA: Diagnosis not present

## 2020-08-24 LAB — ECHOCARDIOGRAM COMPLETE
Area-P 1/2: 4.13 cm2
MV M vel: 4.85 m/s
MV Peak grad: 94.1 mmHg
Radius: 0.3 cm
S' Lateral: 2.7 cm

## 2020-09-07 ENCOUNTER — Telehealth: Payer: Self-pay | Admitting: Cardiovascular Disease

## 2020-09-07 NOTE — Telephone Encounter (Signed)
       I went in pt's chart to see who called her. I transferred call to [facility]

## 2020-10-24 ENCOUNTER — Other Ambulatory Visit: Payer: Self-pay

## 2020-10-24 MED ORDER — RIVAROXABAN 20 MG PO TABS: ORAL_TABLET | ORAL | 1 refills | Status: DC

## 2020-11-02 ENCOUNTER — Ambulatory Visit (INDEPENDENT_AMBULATORY_CARE_PROVIDER_SITE_OTHER): Payer: Medicare Other

## 2020-11-02 DIAGNOSIS — I495 Sick sinus syndrome: Secondary | ICD-10-CM | POA: Diagnosis not present

## 2020-11-02 LAB — CUP PACEART REMOTE DEVICE CHECK
Battery Remaining Longevity: 158 mo
Battery Voltage: 3.11 V
Brady Statistic AP VP Percent: 1.87 %
Brady Statistic AP VS Percent: 84.9 %
Brady Statistic AS VP Percent: 3.34 %
Brady Statistic AS VS Percent: 10.05 %
Brady Statistic RA Percent Paced: 8.79 %
Brady Statistic RV Percent Paced: 24.63 %
Date Time Interrogation Session: 20211207203632
Implantable Lead Implant Date: 20201207
Implantable Lead Location: 753859
Implantable Lead Location: 753860
Implantable Lead Model: 4076
Implantable Lead Model: 4076
Implantable Pulse Generator Implant Date: 20201207
Lead Channel Impedance Value: 323 Ohm
Lead Channel Impedance Value: 418 Ohm
Lead Channel Impedance Value: 570 Ohm
Lead Channel Impedance Value: 608 Ohm
Lead Channel Pacing Threshold Amplitude: 0.5 V
Lead Channel Pacing Threshold Amplitude: 0.5 V
Lead Channel Pacing Threshold Pulse Width: 0.4 ms
Lead Channel Pacing Threshold Pulse Width: 0.4 ms
Lead Channel Sensing Intrinsic Amplitude: 1.875 mV
Lead Channel Sensing Intrinsic Amplitude: 1.875 mV
Lead Channel Sensing Intrinsic Amplitude: 22.25 mV
Lead Channel Sensing Intrinsic Amplitude: 22.25 mV
Lead Channel Setting Pacing Amplitude: 1.5 V
Lead Channel Setting Pacing Amplitude: 2.5 V
Lead Channel Setting Pacing Pulse Width: 0.4 ms
Lead Channel Setting Sensing Sensitivity: 0.9 mV

## 2020-11-03 ENCOUNTER — Telehealth: Payer: Self-pay

## 2020-11-03 NOTE — Telephone Encounter (Signed)
[facility] scheduled remote received 11/03/20. Persistent AF noted, on Toprol & Xarelto. V rates controlled. 3 VHR events logged from 1 second to 3 minutes w/ rates 170-190's. EGM's suggest AF w/ RVR.  EMR; Lasix 20 mg daily. Toprol-XL 100 mg daily, xarelto 20 mg daily.   Called to assess for s/s, medication compliance.  No answer, LMOVM.

## 2020-11-03 NOTE — Telephone Encounter (Signed)
Returning phone call.   Patient reports she has felt well and not fatigued. Patient states she was not aware of any events. Denies any issues with fluid retention. Reports compliance with all medications. Patient will soon be in for check up with Dr. Caryl Comes.  Advised I will forward to Dr. Caryl Comes and call with changes.

## 2020-11-03 NOTE — Telephone Encounter (Signed)
The pt is returning [facility] phone call. I told her the nurse will give her a call back as soon as she can.

## 2020-11-08 DIAGNOSIS — I272 Pulmonary hypertension, unspecified: Secondary | ICD-10-CM | POA: Insufficient documentation

## 2020-11-08 NOTE — Telephone Encounter (Signed)
Leigh  with rapid rates and on metoprolol 100 and device in place lets begin 120 dilt

## 2020-11-08 NOTE — Progress Notes (Signed)
Cardiology Office Note   Date:  11/09/2020   ID:  Darlene Boyer, DOB 1940-12-15, MRN [id number]  PCP:  Lois Huxley, PA  Cardiologist:   Minus Breeding, MD   Chief Complaint  Patient presents with  . Atrial Fibrillation      History of Present Illness: Darlene Boyer is a 79 y.o. female who presents for evaluation of atrial fibrillation.  She had a pacemaker in Dec of last year for tachybrady syndrome.  Since I last saw her she has done well.  She was noted recently to have rapid rates with her atrial fibrillation.  Dr. Caryl Comes wanted her to start on Cardizem but she did not get that message yet and is here to see me today.  She had mentioned to the people on the phone the other day the there was some fatigue which she actually is denying that.  She says she feels well.  She does not feel any tachypalpitations.  She has no shortness of breath, PND or orthopnea.  She has no palpitations, presyncope or syncope.  She is not having any chest pressure.  She lost weight through diet and she thinks it helped her a lot.  Past Medical History:  Diagnosis Date  . Arthritis   . Atrial fibrillation ([place])   . Diabetes mellitus without complication ([place])   . Hypertension   . Obesity   . Thyroid disease     Past Surgical History:  Procedure Laterality Date  . ABDOMINAL HYSTERECTOMY     partial  . ANKLE FRACTURE SURGERY Right   . COLONOSCOPY  01/27/2020  . EYE SURGERY     Lens implants  . FOOT SURGERY Left    Pins in 3 toes  . GALLBLADDER SURGERY    . PACEMAKER IMPLANT N/A 11/02/2019   Procedure: PACEMAKER IMPLANT;  Surgeon: Deboraha Sprang, MD;  Location: [facility];  Service: Cardiovascular;  Laterality: N/A;  . TUBAL LIGATION    . UPPER GASTROINTESTINAL ENDOSCOPY  01/27/2020     Current Outpatient Medications  Medication Sig Dispense Refill  . dextromethorphan-guaiFENesin (MUCINEX DM) 30-600 MG 12hr tablet Take 1 tablet by mouth as needed for cough.     .  furosemide (LASIX) 20 MG tablet Take 20 mg by mouth.    . levothyroxine (SYNTHROID, LEVOTHROID) 100 MCG tablet Take 100 mcg by mouth daily before breakfast.    . loratadine (CLARITIN) 10 MG tablet Take 10 mg by mouth every other day.    . losartan (COZAAR) 50 MG tablet Take 50 mg by mouth daily.    . metFORMIN (GLUCOPHAGE) 1000 MG tablet Take 1,500 mg by mouth daily.     . metoprolol succinate (TOPROL-XL) 100 MG 24 hr tablet Take 100 mg by mouth daily.  5  . montelukast (SINGULAIR) 10 MG tablet Take 10 mg by mouth daily.    . Multiple Vitamins-Minerals (WOMENS 50+ MULTI VITAMIN/MIN PO) Take 1 tablet by mouth daily.    . pantoprazole (PROTONIX) 40 MG tablet Take 40 mg by mouth as needed.    . rivaroxaban (XARELTO) 20 MG TABS tablet TAKE 1 TABLET BY MOUTH DAILY WITH SUPPER 90 tablet 1  . traZODone (DESYREL) 50 MG tablet Take 50 mg by mouth at bedtime as needed for sleep.     Marland Kitchen diltiazem (CARDIZEM CD) 120 MG 24 hr capsule Take 1 capsule (120 mg total) by mouth daily. 90 capsule 3   No current facility-administered medications for this visit.  Allergies:   Patient has no allergy information on record.   ROS:  Please see the history of present illness.   Otherwise, review of systems are positive for none.   PHYSICAL EXAM: VS:  BP 140/69   Pulse 74   Temp (!) 95.5 F (35.3 C)   Ht 5\' 5"  (1.651 m)   Wt 224 lb 6.4 oz (101.8 kg)   SpO2 98%   BMI 37.34 kg/m  , BMI Body mass index is 37.34 kg/m.  GENERAL:  Well appearing NECK:  Positive jugular venous distention, waveform within normal limits, carotid upstroke brisk and symmetric, no bruits, no thyromegaly LUNGS:  Clear to auscultation bilaterally CHEST:  Well healed pacemaker pocket.   HEART:  PMI not displaced or sustained,S1 and S2 within normal limits, no S3, no clicks, no rubs, no murmurs, irregular ABD:  Flat, positive bowel sounds normal in frequency in pitch, no bruits, no rebound, no guarding, no midline pulsatile mass, no  hepatomegaly, no splenomegaly EXT:  2 plus pulses throughout, no edema, no cyanosis no clubbing  EKG:  EKG is   ordered today. Atrial fibrillation, with occasional ventricular pacing   Recent Labs: 06/07/2020: BUN 11; Creatinine, Ser 0.94; Hemoglobin 10.6; Platelets 197; Potassium 4.0; Sodium 145    Lipid Panel No results found for: CHOL, TRIG, HDL, CHOLHDL, VLDL, LDLCALC, LDLDIRECT    Wt Readings from Last 3 Encounters:  11/09/20 224 lb 6.4 oz (101.8 kg)  07/14/20 213 lb (96.6 kg)  06/07/20 220 lb (99.8 kg)      Other studies Reviewed: Additional studies/ records that were reviewed today include: Device interrogation Review of the above records demonstrates:   See above   ASSESSMENT AND PLAN:  ATRIAL FIB/FLUTTER:   Darlene Boyer has a CHA2DS2 - VASc score of 6 .  She tolerates anticoagulation.  I am going to add Cardizem CD 120 mg given the rapid rates.  HTN:  The blood pressure is mildly elevated and will be addressed with the addition of Cardizem as above.   DM:  A1C is 7.5 which is down from 8.7.  I will defer to her primary provider.   BRADYCARDIA: She is now status post pacemaker with results as above.    PULMONARY HTN:   I am going to order an echocardiogram from March and follow her up after this.   COVID EDUCATION: She has been vaccinated.  Current medicines are reviewed at length with the patient today.  The patient does not have concerns regarding medicines.  The following changes have been made: As above  Labs/ tests ordered today include:     Orders Placed This Encounter  Procedures  . EKG 12-Lead  . ECHOCARDIOGRAM COMPLETE     Disposition:   FU with me in March or April  Signed, Minus Breeding, MD  11/09/2020 9:17 PM    [place]

## 2020-11-09 ENCOUNTER — Encounter: Payer: Self-pay | Admitting: Cardiology

## 2020-11-09 ENCOUNTER — Other Ambulatory Visit: Payer: Self-pay

## 2020-11-09 ENCOUNTER — Ambulatory Visit: Payer: Medicare Other | Admitting: Cardiology

## 2020-11-09 VITALS — BP 140/69 | HR 74 | Temp 95.5°F | Ht 65.0 in | Wt 224.4 lb

## 2020-11-09 DIAGNOSIS — E118 Type 2 diabetes mellitus with unspecified complications: Secondary | ICD-10-CM

## 2020-11-09 DIAGNOSIS — I4892 Unspecified atrial flutter: Secondary | ICD-10-CM

## 2020-11-09 DIAGNOSIS — I272 Pulmonary hypertension, unspecified: Secondary | ICD-10-CM | POA: Diagnosis not present

## 2020-11-09 DIAGNOSIS — R001 Bradycardia, unspecified: Secondary | ICD-10-CM | POA: Diagnosis not present

## 2020-11-09 DIAGNOSIS — I10 Essential (primary) hypertension: Secondary | ICD-10-CM

## 2020-11-09 MED ORDER — DILTIAZEM HCL ER COATED BEADS 120 MG PO CP24: 120.0000 mg | ORAL_CAPSULE | Freq: Every day | ORAL | 3 refills | Status: DC

## 2020-11-09 NOTE — Patient Instructions (Signed)
Medication Instructions:  Start Cardizem 120mg  daily *If you need a refill on your cardiac medications before your next appointment, please call your pharmacy*  Lab Work: None ordered this visit  Testing/Procedures: Your physician has requested that you have an echocardiogram [facility]. Echocardiography is a painless test that uses sound waves to create images of your heart. It provides your doctor with information about the size and shape of your heart and how well your heart's chambers and valves are working. This procedure takes approximately one hour. There are no restrictions for this procedure.  Follow-Up: At [facility], you and your health needs are our priority.  As part of our continuing mission to provide you with exceptional heart care, we have created designated Provider Care Teams.  These Care Teams include your primary Cardiologist (physician) and Advanced Practice Providers (APPs -  Physician Assistants and Nurse Practitioners) who all work together to provide you with the care you need, when you need it.  Your next appointment:   In March after Echo  The format for your next appointment:   In Person  Provider:   Minus Breeding, MD

## 2020-11-10 ENCOUNTER — Telehealth: Payer: Self-pay | Admitting: Internal Medicine

## 2020-11-10 NOTE — Telephone Encounter (Signed)
Attempted phone call to pt.  Left voicemail to contact RN at [phone]. 

## 2020-11-10 NOTE — Telephone Encounter (Signed)
Spoke with pt and confirmed Dr Percival Spanish started pt on Diltiazem 120mg  1 tablet by mouth daily at her appointment with him 11/09/2020.  Pt states she started medication this am.  Pt reports she has been feeling well with no problems and thanked Therapist, sports for call.

## 2020-11-10 NOTE — Telephone Encounter (Signed)
Spoke with Darlene Boyer and confirmed Dr Percival Spanish started Darlene Boyer on Diltiazem 120mg  1 tablet by mouth daily at her appointment with him 11/09/2020.  Darlene Boyer states she started medication this am.  Darlene Boyer reports she has been feeling well with no problems and thanked Therapist, sports for call.

## 2020-11-10 NOTE — Telephone Encounter (Signed)
° ° °  Pt is returning Marsha's call

## 2020-11-14 NOTE — Progress Notes (Signed)
Remote pacemaker transmission.   

## 2020-12-22 ENCOUNTER — Other Ambulatory Visit: Payer: Self-pay | Admitting: Physician Assistant

## 2020-12-22 DIAGNOSIS — Z780 Asymptomatic menopausal state: Secondary | ICD-10-CM

## 2021-02-01 ENCOUNTER — Ambulatory Visit (INDEPENDENT_AMBULATORY_CARE_PROVIDER_SITE_OTHER): Payer: Medicare Other

## 2021-02-01 DIAGNOSIS — I440 Atrioventricular block, first degree: Secondary | ICD-10-CM

## 2021-02-04 LAB — CUP PACEART REMOTE DEVICE CHECK
Battery Remaining Longevity: 155 mo
Battery Voltage: 3.06 V
Brady Statistic RA Percent Paced: 1.56 %
Brady Statistic RV Percent Paced: 35.39 %
Date Time Interrogation Session: 20220309040916
Implantable Lead Implant Date: 20201207
Implantable Lead Location: 753859
Implantable Lead Location: 753860
Implantable Lead Model: 4076
Implantable Lead Model: 4076
Implantable Pulse Generator Implant Date: 20201207
Lead Channel Impedance Value: 323 Ohm
Lead Channel Impedance Value: 418 Ohm
Lead Channel Impedance Value: 589 Ohm
Lead Channel Impedance Value: 627 Ohm
Lead Channel Pacing Threshold Amplitude: 0.375 V
Lead Channel Pacing Threshold Amplitude: 0.5 V
Lead Channel Pacing Threshold Pulse Width: 0.4 ms
Lead Channel Pacing Threshold Pulse Width: 0.4 ms
Lead Channel Sensing Intrinsic Amplitude: 1.5 mV
Lead Channel Sensing Intrinsic Amplitude: 1.5 mV
Lead Channel Sensing Intrinsic Amplitude: 19.5 mV
Lead Channel Sensing Intrinsic Amplitude: 19.5 mV
Lead Channel Setting Pacing Amplitude: 1.5 V
Lead Channel Setting Pacing Amplitude: 2.5 V
Lead Channel Setting Pacing Pulse Width: 0.4 ms
Lead Channel Setting Sensing Sensitivity: 0.9 mV

## 2021-02-07 ENCOUNTER — Other Ambulatory Visit: Payer: Self-pay

## 2021-02-07 ENCOUNTER — Ambulatory Visit (HOSPITAL_COMMUNITY): Payer: Medicare Other | Attending: Cardiovascular Disease

## 2021-02-07 DIAGNOSIS — I272 Pulmonary hypertension, unspecified: Secondary | ICD-10-CM | POA: Diagnosis not present

## 2021-02-07 LAB — ECHOCARDIOGRAM COMPLETE: S' Lateral: 2.6 cm

## 2021-02-09 NOTE — Progress Notes (Signed)
Remote pacemaker transmission.   

## 2021-02-14 ENCOUNTER — Ambulatory Visit: Payer: Medicare Other | Admitting: Cardiology

## 2021-02-14 ENCOUNTER — Encounter: Payer: Self-pay | Admitting: Cardiology

## 2021-02-14 ENCOUNTER — Other Ambulatory Visit: Payer: Self-pay

## 2021-02-14 VITALS — BP 150/81 | HR 87 | Ht 65.0 in | Wt 229.8 lb

## 2021-02-14 DIAGNOSIS — I10 Essential (primary) hypertension: Secondary | ICD-10-CM

## 2021-02-14 DIAGNOSIS — I480 Paroxysmal atrial fibrillation: Secondary | ICD-10-CM

## 2021-02-14 MED ORDER — FUROSEMIDE 20 MG PO TABS
20.0000 mg | ORAL_TABLET | Freq: Every day | ORAL | 11 refills | Status: DC
Start: 2021-02-14 — End: 2021-12-25

## 2021-02-14 NOTE — Patient Instructions (Signed)
Medication Instructions:  REFILL SENT FOR LASIX *If you need a refill on your cardiac medications before your next appointment, please call your pharmacy*  Lab Work:   Testing/Procedures:  NONE    NONE  Follow-Up: Your next appointment:  12 month(s) In Person with You may see Minus Breeding, MD or one of the following Advanced Practice Providers on your designated Care Team:  Almyra Deforest, PA-C  Fabian Sharp, [place] or Roby Lofts, PA-C   Please call our office 2 months in advance to schedule this appointment   At [facility], you and your health needs are our priority.  As part of our continuing mission to provide you with exceptional heart care, we have created designated Provider Care Teams.  These Care Teams include your primary Cardiologist (physician) and Advanced Practice Providers (APPs -  Physician Assistants and Nurse Practitioners) who all work together to provide you with the care you need, when you need it.  We recommend signing up for the patient portal called "MyChart".  Sign up information is provided on this After Visit Summary.  MyChart is used to connect with patients for Virtual Visits (Telemedicine).  Patients are able to view lab/test results, encounter notes, upcoming appointments, etc.  Non-urgent messages can be sent to your provider as well.   To learn more about what you can do with MyChart, go to [email or web address].

## 2021-02-14 NOTE — Progress Notes (Signed)
Cardiology Office Note   Date:  02/14/2021   ID:  Stefani, Baik [birth year], MRN [id number]  PCP:  Lois Huxley, PA  Cardiologist:   Minus Breeding, MD   Chief Complaint  Patient presents with  . Atrial Fibrillation      History of Present Illness: ZENDA HERSKOWITZ is a 80 y.o. female who presents for evaluation of atrial fibrillation.  She had a pacemaker in Dec 2020.  Since I last saw her she has done well.  She denies any new cardiovascular symptoms.  Since adding Cardizem last year she gets the palpitations she was having.  She is not overly physically active if she walks to the mailbox and to the garbage can in her apartment complex.  The patient denies any new symptoms such as chest discomfort, neck or arm discomfort. There has been no new shortness of breath, PND or orthopnea. There have been no reported palpitations, presyncope or syncope.   I did follow up with an echocardiogram.  She has tricuspid regurgitation.  She has moderate pulmonary hypertension.  It has been listed as severe before.  He has some mildly reduced RV function.  Past Medical History:  Diagnosis Date  . Arthritis   . Atrial fibrillation ([place])   . Diabetes mellitus without complication ([place])   . Hypertension   . Obesity   . Thyroid disease     Past Surgical History:  Procedure Laterality Date  . ABDOMINAL HYSTERECTOMY     partial  . ANKLE FRACTURE SURGERY Right   . COLONOSCOPY  01/27/2020  . EYE SURGERY     Lens implants  . FOOT SURGERY Left    Pins in 3 toes  . GALLBLADDER SURGERY    . PACEMAKER IMPLANT N/A 11/02/2019   Procedure: PACEMAKER IMPLANT;  Surgeon: Deboraha Sprang, MD;  Location: [facility];  Service: Cardiovascular;  Laterality: N/A;  . TUBAL LIGATION    . UPPER GASTROINTESTINAL ENDOSCOPY  01/27/2020     Current Outpatient Medications  Medication Sig Dispense Refill  . dextromethorphan-guaiFENesin (MUCINEX DM) 30-600 MG 12hr tablet Take 1 tablet by mouth  as needed for cough.     . levothyroxine (SYNTHROID, LEVOTHROID) 100 MCG tablet Take 100 mcg by mouth daily before breakfast.    . loratadine (CLARITIN) 10 MG tablet Take 10 mg by mouth every other day.    . losartan (COZAAR) 50 MG tablet Take 50 mg by mouth daily.    . metFORMIN (GLUCOPHAGE) 1000 MG tablet Take 1,500 mg by mouth daily.     . metoprolol succinate (TOPROL-XL) 100 MG 24 hr tablet Take 100 mg by mouth daily.  5  . montelukast (SINGULAIR) 10 MG tablet Take 10 mg by mouth daily.    . Multiple Vitamins-Minerals (WOMENS 50+ MULTI VITAMIN/MIN PO) Take 1 tablet by mouth daily.    . pantoprazole (PROTONIX) 40 MG tablet Take 40 mg by mouth as needed.    . rivaroxaban (XARELTO) 20 MG TABS tablet TAKE 1 TABLET BY MOUTH DAILY WITH SUPPER 90 tablet 1  . traZODone (DESYREL) 50 MG tablet Take 50 mg by mouth at bedtime as needed for sleep.     Marland Kitchen diltiazem (CARDIZEM CD) 120 MG 24 hr capsule Take 1 capsule (120 mg total) by mouth daily. 90 capsule 3  . furosemide (LASIX) 20 MG tablet Take 1 tablet (20 mg total) by mouth daily. 30 tablet 11   No current facility-administered medications for this visit.  Allergies:   Patient has no allergy information on record.   ROS:  Please see the history of present illness.   Otherwise, review of systems are positive for none.   PHYSICAL EXAM: VS:  BP (!) 150/81   Pulse 87   Ht 5\' 5"  (1.651 m)   Wt 229 lb 12.8 oz (104.2 kg)   SpO2 98%   BMI 38.24 kg/m  , BMI Body mass index is 38.24 kg/m.  GENERAL:  Well appearing NECK:  Positive jugular venous distention, waveform within normal limits, carotid upstroke brisk and symmetric, no bruits, no thyromegaly LUNGS:  Clear to auscultation bilaterally CHEST:  Well healed pacemaker pocket.   HEART:  PMI not displaced or sustained,S1 and S2 within normal limits, no S3, no clicks, no rubs, no murmurs, irregular ABD:  Flat, positive bowel sounds normal in frequency in pitch, no bruits, no rebound, no guarding,  no midline pulsatile mass, no hepatomegaly, no splenomegaly EXT:  2 plus pulses throughout, no edema, no cyanosis no clubbing  EKG:  EKG is   ordered today. Atrial fibrillation, with occasional ventricular pacing   Recent Labs: 06/07/2020: BUN 11; Creatinine, Ser 0.94; Hemoglobin 10.6; Platelets 197; Potassium 4.0; Sodium 145    Lipid Panel No results found for: CHOL, TRIG, HDL, CHOLHDL, VLDL, LDLCALC, LDLDIRECT    Wt Readings from Last 3 Encounters:  02/14/21 229 lb 12.8 oz (104.2 kg)  11/09/20 224 lb 6.4 oz (101.8 kg)  07/14/20 213 lb (96.6 kg)      Other studies Reviewed: Additional studies/ records that were reviewed today include: Device interrogation Review of the above records demonstrates:   See above   ASSESSMENT AND PLAN:  ATRIAL FIB/FLUTTER:   [name] has a CHA2DS2 - VASc score of 6 .  Tolerates anticoagulation and has had no symptoms related to the paroxysmal atrial fibrillation.  No change in therapy.  HTN:  The blood pressure is elevated today but she says it is in the systolic 952 range typically.  She is going to keep a blood pressure diary.  Otherwise no change in therapy.   DM:  A1C is 7.1 down from 7.5.  Continue current therapy.  BRADYCARDIA:    She is now status post pacemaker with results as above.    PULMONARY HTN: She has some moderate pulmonary hypertension but she has no symptoms related to this.  We are managing this conservatively with blood pressure control, weight loss, rate management and I will reassess with an echo in the future.   Current medicines are reviewed at length with the patient today.  The patient does not have concerns regarding medicines.  The following changes have been made: None  Labs/ tests ordered today include:    None  No orders of the defined types were placed in this encounter.    Disposition:   FU with me in one year.   Signed, Minus Breeding, MD  02/14/2021 5:31 PM    [place]

## 2021-04-15 ENCOUNTER — Other Ambulatory Visit: Payer: Self-pay | Admitting: Cardiology

## 2021-04-17 NOTE — Telephone Encounter (Signed)
34F, 104.2KG, SCR 0.94 06/07/20, LOVW/HOCHREIN 02/14/21, CCR 61.2

## 2021-05-03 ENCOUNTER — Ambulatory Visit (INDEPENDENT_AMBULATORY_CARE_PROVIDER_SITE_OTHER): Payer: Medicare Other

## 2021-05-03 DIAGNOSIS — I440 Atrioventricular block, first degree: Secondary | ICD-10-CM

## 2021-05-03 LAB — CUP PACEART REMOTE DEVICE CHECK
Battery Remaining Longevity: 151 mo
Battery Voltage: 3.04 V
Brady Statistic AP VP Percent: 0.39 %
Brady Statistic AP VS Percent: 91.59 %
Brady Statistic AS VP Percent: 0.46 %
Brady Statistic AS VS Percent: 7.56 %
Brady Statistic RA Percent Paced: 54.98 %
Brady Statistic RV Percent Paced: 16.23 %
Date Time Interrogation Session: 20220608052533
Implantable Lead Implant Date: 20201207
Implantable Lead Location: 753859
Implantable Lead Location: 753860
Implantable Lead Model: 4076
Implantable Lead Model: 4076
Implantable Pulse Generator Implant Date: 20201207
Lead Channel Impedance Value: 323 Ohm
Lead Channel Impedance Value: 380 Ohm
Lead Channel Impedance Value: 608 Ohm
Lead Channel Impedance Value: 627 Ohm
Lead Channel Pacing Threshold Amplitude: 0.5 V
Lead Channel Pacing Threshold Amplitude: 0.5 V
Lead Channel Pacing Threshold Pulse Width: 0.4 ms
Lead Channel Pacing Threshold Pulse Width: 0.4 ms
Lead Channel Sensing Intrinsic Amplitude: 20.375 mV
Lead Channel Sensing Intrinsic Amplitude: 20.375 mV
Lead Channel Sensing Intrinsic Amplitude: 3.625 mV
Lead Channel Sensing Intrinsic Amplitude: 3.625 mV
Lead Channel Setting Pacing Amplitude: 1.5 V
Lead Channel Setting Pacing Amplitude: 2.5 V
Lead Channel Setting Pacing Pulse Width: 0.4 ms
Lead Channel Setting Sensing Sensitivity: 0.9 mV

## 2021-05-25 NOTE — Progress Notes (Signed)
Remote pacemaker transmission.   

## 2021-07-01 ENCOUNTER — Encounter: Payer: Self-pay | Admitting: Internal Medicine

## 2021-08-02 ENCOUNTER — Ambulatory Visit (INDEPENDENT_AMBULATORY_CARE_PROVIDER_SITE_OTHER): Payer: Medicare Other

## 2021-08-02 DIAGNOSIS — I440 Atrioventricular block, first degree: Secondary | ICD-10-CM

## 2021-08-02 LAB — CUP PACEART REMOTE DEVICE CHECK
Battery Remaining Longevity: 149 mo
Battery Voltage: 3.03 V
Brady Statistic RA Percent Paced: 0.47 %
Brady Statistic RV Percent Paced: 34.19 %
Date Time Interrogation Session: 20220907002140
Implantable Lead Implant Date: 20201207
Implantable Lead Location: 753859
Implantable Lead Location: 753860
Implantable Lead Model: 4076
Implantable Lead Model: 4076
Implantable Pulse Generator Implant Date: 20201207
Lead Channel Impedance Value: 323 Ohm
Lead Channel Impedance Value: 418 Ohm
Lead Channel Impedance Value: 551 Ohm
Lead Channel Impedance Value: 589 Ohm
Lead Channel Pacing Threshold Amplitude: 0.5 V
Lead Channel Pacing Threshold Amplitude: 0.5 V
Lead Channel Pacing Threshold Pulse Width: 0.4 ms
Lead Channel Pacing Threshold Pulse Width: 0.4 ms
Lead Channel Sensing Intrinsic Amplitude: 0.75 mV
Lead Channel Sensing Intrinsic Amplitude: 0.75 mV
Lead Channel Sensing Intrinsic Amplitude: 20.75 mV
Lead Channel Sensing Intrinsic Amplitude: 20.75 mV
Lead Channel Setting Pacing Amplitude: 1.5 V
Lead Channel Setting Pacing Amplitude: 2.5 V
Lead Channel Setting Pacing Pulse Width: 0.4 ms
Lead Channel Setting Sensing Sensitivity: 0.9 mV

## 2021-08-10 LAB — HM DIABETES EYE EXAM

## 2021-08-10 NOTE — Progress Notes (Signed)
Remote pacemaker transmission.   

## 2021-10-02 ENCOUNTER — Telehealth: Payer: Self-pay | Admitting: Internal Medicine

## 2021-10-02 NOTE — Telephone Encounter (Signed)
Patient called and said she get s a tight, pulling sensation that starts in her neck and goes down to where her implanted device is. She said her neck gets stiff and she can not turn her neck. She is scheduled to see Tommye Standard 10/11/21. Patient does not report any chest pain, SOB, Dizziness or palpitations

## 2021-10-02 NOTE — Telephone Encounter (Signed)
Spoke with patient had her send in a manual transmission, transmission reviewed, device functioning normally, lead measurements stable advised patient to f/u with PCP or urgent care regarding neck problems. Patient voiced understanding.

## 2021-10-07 ENCOUNTER — Other Ambulatory Visit: Payer: Self-pay | Admitting: Cardiology

## 2021-10-08 ENCOUNTER — Ambulatory Visit (HOSPITAL_COMMUNITY)
Admission: EM | Admit: 2021-10-08 | Discharge: 2021-10-08 | Disposition: A | Discharge status: Home / Self Care | Payer: Medicare Other | Attending: Family Medicine | Admitting: Family Medicine

## 2021-10-08 ENCOUNTER — Encounter (HOSPITAL_COMMUNITY): Payer: Self-pay | Admitting: Emergency Medicine

## 2021-10-08 ENCOUNTER — Other Ambulatory Visit: Payer: Self-pay

## 2021-10-08 DIAGNOSIS — M436 Torticollis: Secondary | ICD-10-CM | POA: Diagnosis not present

## 2021-10-08 DIAGNOSIS — M62838 Other muscle spasm: Secondary | ICD-10-CM

## 2021-10-08 MED ORDER — DEXAMETHASONE SODIUM PHOSPHATE 10 MG/ML IJ SOLN
10.0000 mg | Freq: Once | INTRAMUSCULAR | Status: AC
  Administered 2021-10-08: 10 mg via INTRAMUSCULAR

## 2021-10-08 MED ORDER — TIZANIDINE HCL 2 MG PO TABS
2.0000 mg | ORAL_TABLET | Freq: Every evening | ORAL | 0 refills | Status: DC | PRN
Start: 2021-10-08 — End: 2022-01-08

## 2021-10-08 MED ORDER — DEXAMETHASONE SODIUM PHOSPHATE 10 MG/ML IJ SOLN
INTRAMUSCULAR | Status: AC
  Filled 2021-10-08: qty 1

## 2021-10-08 MED ORDER — DICLOFENAC SODIUM 1 % EX GEL: 4.0000 g | Freq: Four times a day (QID) | CUTANEOUS | 0 refills | Status: DC

## 2021-10-08 NOTE — ED Provider Notes (Signed)
[name] Provider Note   CSN: [id number] Arrival date & time: 10/08/21  1258     History Chief Complaint  Patient presents with   Torticollis    Darlene Boyer is a 80 y.o. female.  HPI   Patient presents with 1 week of left-sided neck pain that is radiating into her shoulder and at times to the right side of her neck.  She reports onset of pain after falling asleep on her sofa upon awakening noticing a mild pain in her neck and shoulder which is gradually worsening over the last week.  Patient is chronically on an anticoagulant therefore is unable to take any NSAIDs.  She has been treating pain with warm compresses and Tylenol with temporary relief.  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  She maintains full range of motion with right lateral rotation but unable to rotate her the neck left and extend neck backwards without producing pain.  Denies any acute injury.  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   Palpitations 05/16/2020   Symptomatic bradycardia 10/31/2019   Bradycardia 10/31/2019   Atrial flutter ([place]) 04/09/2016   Hypertension    Diabetes mellitus without complication ([place])    Thyroid disease     Past Surgical History:  Procedure Laterality Date   ABDOMINAL HYSTERECTOMY     partial   ANKLE FRACTURE SURGERY Right    COLONOSCOPY  01/27/2020   EYE SURGERY     Lens  implants   FOOT SURGERY Left    Pins in 3 toes   GALLBLADDER SURGERY     PACEMAKER IMPLANT N/A 11/02/2019   Procedure: PACEMAKER IMPLANT;  Surgeon: Deboraha Sprang, MD;  Location: [facility];  Service: Cardiovascular;  Laterality: N/A;   TUBAL LIGATION     UPPER GASTROINTESTINAL ENDOSCOPY  01/27/2020     OB History   No obstetric history on file.     Family History  Problem Relation Age of Onset   Emphysema Father    Cancer Mother        told lung and breast   Colon cancer Neg Hx    Esophageal cancer Neg Hx    Rectal cancer Neg Hx    Stomach cancer Neg Hx     Social History   Tobacco Use   Smoking status: Never   Smokeless tobacco: Current    Types: Snuff   Tobacco comments:    "every now and then"  Vaping Use   Vaping Use: Never used  Substance Use Topics   Alcohol use: No    Alcohol/week: 0.0 standard drinks   Drug use: No    Home Medications Prior to Admission medications   Medication Sig Start Date End Date Taking? Authorizing Provider  diclofenac Sodium (VOLTAREN) 1 % GEL Apply 4 g topically 4 (four) times daily. 10/08/21  Yes Scot Jun, FNP  tiZANidine (ZANAFLEX)  2 MG tablet Take 1 tablet (2 mg total) by mouth at bedtime as needed for muscle spasms. 10/08/21  Yes Scot Jun, FNP  dextromethorphan-guaiFENesin [facility] DM) 30-600 MG 12hr tablet Take 1 tablet by mouth as needed for cough.     [provider]  diltiazem (CARDIZEM CD) 120 MG 24 hr capsule Take 1 capsule (120 mg total) by mouth daily. 11/09/20 02/07/21  Minus Breeding, MD  furosemide (LASIX) 20 MG tablet Take 1 tablet (20 mg total) by mouth daily. 02/14/21   Minus Breeding, MD  levothyroxine (SYNTHROID, LEVOTHROID) 100 MCG tablet Take 100 mcg by mouth daily before breakfast.    [provider]  loratadine (CLARITIN) 10 MG tablet Take 10 mg by mouth every other day.    [provider]  losartan (COZAAR) 50 MG tablet Take 50 mg by mouth daily.     [provider]  metFORMIN (GLUCOPHAGE) 1000 MG tablet Take 1,500 mg by mouth daily.     [provider]  metoprolol succinate (TOPROL-XL) 100 MG 24 hr tablet Take 100 mg by mouth daily. 04/04/16   [provider]  montelukast (SINGULAIR) 10 MG tablet Take 10 mg by mouth daily. 06/06/20   [provider]  Multiple Vitamins-Minerals (WOMENS 50+ [place] VITAMIN/MIN PO) Take 1 tablet by mouth daily.    [provider]  pantoprazole (PROTONIX) 40 MG tablet Take 40 mg by mouth as needed.    [provider]  traZODone (DESYREL) 50 MG tablet Take 50 mg by mouth at bedtime as needed for sleep.  10/12/19   [provider]  XARELTO 20 MG TABS tablet TAKE 1 TABLET BY MOUTH DAILY WITH SUPPER 04/17/21   Minus Breeding, MD    Allergies    Patient has no known allergies.  Review of Systems   Review of Systems Pertinent negatives listed in HPI  Physical Exam Updated Vital Signs BP (!) 174/101 (BP Location: Right Arm)   Pulse 79   Temp 98.1 F (36.7 C) (Oral)   Resp 17   SpO2 98%   Physical Exam Constitutional:      General: She is not in acute distress.    Appearance: Normal appearance. She is not ill-appearing.  Cardiovascular:     Rate and Rhythm: Normal rate.  Pulmonary:     Effort: Pulmonary effort is normal.     Breath sounds: Normal breath sounds.  Musculoskeletal:     Cervical back: Tenderness present. Pain with movement and muscular tenderness present. No spinous process tenderness. Decreased range of motion.  Lymphadenopathy:     Cervical: No cervical adenopathy.  Neurological:     Mental Status: She is alert.     GCS: GCS eye subscore is 4. GCS verbal subscore is 5. GCS motor subscore is 6.     Motor: Motor function is intact.     Coordination: Coordination is intact.  Psychiatric:        Attention and Perception: Attention normal.        Speech: Speech normal.        Behavior: Behavior normal.        Thought  Content: Thought content normal.        Cognition and Memory: Cognition normal.        Judgment: Judgment normal.   ED Results / Procedures / Treatments   Labs (all labs ordered are listed, but only abnormal results are displayed) Labs Reviewed - No data to display  EKG None  Radiology No results  found.  Procedures Procedures   Medications Ordered in ED Medications  dexamethasone (DECADRON) injection 10 mg (10 mg Intramuscular Given 10/08/21 1526)    ED Course  I have reviewed the triage vital signs and the nursing notes.  Pertinent labs & imaging results that were available during my care of the patient were reviewed by me and considered in my medical decision making (see chart for details).   Acute neck pain, 1 dose of Decadron 10 mg IM.  Patient is a diabetic would like to avoid any long-term steroid treatments.  Patient is also prescribed an anticoagulant therefore unable to treat pain with NSAIDs. Prescribed a low-dose bedtime as needed muscle relaxer tizanidine precautions given to exercise caution with walking and certain movements given medication can induce drowsiness and dizziness.  Voltaren gel topical 4 times daily as needed for pain.  Continue warm compresses and Tylenol. Follow-up with PCP or RTC as needed  Final Clinical Impression(s) / ED Diagnoses Final diagnoses:  Acute muscle stiffness of neck  Neck muscle spasm    Rx / DC Orders ED Discharge Orders          Ordered    tiZANidine (ZANAFLEX) 2 MG tablet  At bedtime PRN        10/08/21 1522    diclofenac Sodium (VOLTAREN) 1 % GEL  4 times daily        10/08/21 [place], Perry, FNP 10/08/21 1545

## 2021-10-08 NOTE — ED Triage Notes (Signed)
Pt presents with a pulling sensation in left side of neck xs 1 week.

## 2021-10-08 NOTE — Discharge Instructions (Addendum)
You received a cortisone shot here today to help with the inflammation is causing her to have the neck stiffness and neck pain.  You may rub Voltaren gel on the affected area of her neck up to 4 times daily as needed to help alleviate pain this medication can be used on any muscle or joint region that you are experiencing pain.  Continue Tylenol as needed.  Continue warm compress applications to painful areas. I have prescribed a short supply of muscle relaxers only take at nighttime prior to bedtime.  If medication makes you still fairly sedated or dizzy discontinue medication.  Use caution when ambulating after taking medication as this medication can cause dizziness and sedation.

## 2021-10-08 NOTE — Progress Notes (Signed)
Cardiology Office Note Date:  10/11/2021  Patient ID:  Darlene, Boyer 09/03/1941, MRN [id number] PCP:  Lois Huxley, PA  Cardiologist:  DR. Percival Spanish EP: Dr. Caryl Comes   Chief Complaint:  6 mo  History of Present Illness: Darlene Boyer is a 80 y.o. female with history of DM, HTN, arthritis, hypothyroidism, AFib , tachy-brady w/PPM  I saw her April 2021 She comes in today to be seen for Dr. Caryl Comes.  Last seen by him 02/02/20.  She had mentioned palpitations that he suspected were PVCs observed, though she also had some PAF (largely rate controlled).  Significant 1st degree AVblock, though elected to keep MVP on to allow intrinsic conduction with narrow QRS.  Felt once her PR became >354ms would program MVP off for better av synchrony. Discussed perhaps event monitoring to correlate her symptoms and rhythm/ectopy better. Recommended sleep study for sleep disordered breathing.  (not yet done as far as I can tell) She continues with palpitations though today says they are positional when laying on her side right or left, maybe more left.  Noting less if at all during the day.   She describes them as feeling her heart beat in her back/flank (opposite to the side she is laying on) and associated with physical motion of he body, the palpitations make her body move like something has pushed her slightly No CP or SOB, no DOE, no dizzy spells, near syncope or syncope No bleeding or signs of bleeding Palpitations seemed to correlate with some PVCs she had while on programmed, not diaph stim, urged to f/u with sleep study and titration  She has followed with Dr. Warren Lacy since, last seeing him March 2022, she was doing pretty well, sedentary, recent ehco noted mod p.HTN (previously severe), mild reduced RV function No changes made  Pt called with pain in her neck that radiated to her device and given an appt to come in.  TODAY She is doing well Says she was lifting/carrying a cases of water  pulled a muscle neck the pain seemed to radiate towards her device and she was worried she may have hurt her device. She had carried a couple cases of water, no CP, palpitations or SOB. She lives in her own apartment, has no difficulties with her ADLS. N o dizzy spells, near syncope or syncope. 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  Past Medical History:  Diagnosis Date   Arthritis    Atrial fibrillation ([place])    Diabetes mellitus without complication ([place])    Hypertension    Obesity    Thyroid disease     Past Surgical History:  Procedure Laterality Date   ABDOMINAL HYSTERECTOMY     partial   ANKLE FRACTURE SURGERY Right    COLONOSCOPY  01/27/2020   EYE SURGERY     Lens implants   FOOT SURGERY Left    Pins in 3 toes   GALLBLADDER SURGERY     PACEMAKER IMPLANT N/A 11/02/2019   Procedure: PACEMAKER IMPLANT;  Surgeon: Deboraha Sprang, MD;  Location: [facility];  Service: Cardiovascular;  Laterality: N/A;   TUBAL LIGATION     UPPER GASTROINTESTINAL ENDOSCOPY  01/27/2020    Current Outpatient Medications  Medication Sig Dispense Refill   dextromethorphan-guaiFENesin (MUCINEX DM) 30-600 MG 12hr tablet Take 1 tablet by mouth as needed for cough.      diclofenac Sodium (VOLTAREN) 1 % GEL Apply 4 g  topically 4 (four) times daily. 100 g 0   diltiazem (CARDIZEM CD) 120 MG 24 hr capsule TAKE 1 CAPSULE BY MOUTH EVERY DAY 90 capsule 3   furosemide (LASIX) 20 MG tablet Take 1 tablet (20 mg total) by mouth daily. 30 tablet 11   levothyroxine (SYNTHROID, LEVOTHROID) 100 MCG tablet Take 100 mcg by mouth daily before breakfast.     loratadine (CLARITIN) 10 MG tablet Take 10 mg by mouth every other day.     losartan (COZAAR) 50 MG tablet Take 50 mg by mouth daily.     metFORMIN (GLUCOPHAGE) 1000 MG tablet Take 1,500 mg by mouth daily.      metoprolol succinate (TOPROL-XL) 100 MG 24 hr tablet Take 100 mg by mouth daily.  5    montelukast (SINGULAIR) 10 MG tablet Take 10 mg by mouth daily.     Multiple Vitamins-Minerals (WOMENS 50+ MULTI VITAMIN/MIN PO) Take 1 tablet by mouth daily.     tiZANidine (ZANAFLEX) 2 MG tablet Take 1 tablet (2 mg total) by mouth at bedtime as needed for muscle spasms. 12 tablet 0   XARELTO 20 MG TABS tablet TAKE 1 TABLET BY MOUTH DAILY WITH SUPPER 90 tablet 1   No current facility-administered medications for this visit.    Allergies:   Patient has no known allergies.   Social History:  The patient  reports that she has never smoked. Her smokeless tobacco use includes snuff. She reports that she does not drink alcohol and does not use drugs.   Family History:  The patient's family history includes Cancer in her mother; Emphysema in her father.  ROS:  Please see the history of present illness.  All other systems are reviewed and otherwise negative.   PHYSICAL EXAM:  VS:  BP 140/64   Pulse 80   Ht 5\' 5"  (1.651 m)   Wt 239 lb (108.4 kg)   SpO2 98%   BMI 39.77 kg/m  BMI: Body mass index is 39.77 kg/m. Well nourished, well developed, in no acute distress  HEENT: normocephalic, atraumatic  Neck: no JVD, carotid bruits or masses Cardiac: irreg-irreg; no significant murmurs, no rubs, or gallops Lungs:  CTA b/l, no wheezing, rhonchi or rales  Abd: soft, nontender, obese MS: no deformity or atrophy Ext: trace edema  Skin: warm and dry, no rash Neuro:  No gross deficits appreciated Psych: euthymic mood, full affect  PPM site is stable, no tethering or discomfort   EKG:  done today and reviewed by myself AFlutter (atypical), V paced, some intrinsic beats 66bpm  PPM interrogation done today and reviewed by myself Battery and lead measurements are good She presents in Aflutter, intermittent pacing and intrinsic beats Appears to have been in AF since May by diagnostic Burden overall 81,6% VT episodes are labeld NSVT and are moments of RVR   02/07/2021: TTE IMPRESSIONS   1.  Left ventricular ejection fraction, by estimation, is 60 to 65%. The  left ventricle has normal function. The left ventricle has no regional  wall motion abnormalities. There is mild concentric left ventricular  hypertrophy. Left ventricular diastolic  parameters are indeterminate.   2. Right ventricular systolic function is normal. The right ventricular  size is normal. There is moderately elevated pulmonary artery systolic  pressure.   3. Left atrial size was severely dilated.   4. The mitral valve is normal in structure. Trivial mitral valve  regurgitation. No evidence of mitral stenosis.   5. Tricuspid valve regurgitation is moderate.   6.  The aortic valve is tricuspid. Aortic valve regurgitation is not  visualized. No aortic stenosis is present.   7. The inferior vena cava is dilated in size with >50% respiratory  variability, suggesting right atrial pressure of 8 mmHg.   Comparison(s): 08/04/20 EF 60-65%. PA pressure 74mmHg.     11/01/2019: TTE IMPRESSIONS  1. Left ventricular ejection fraction, by visual estimation, is 65 to  70%. The left ventricle has hyperdynamic function. There is moderately  increased left ventricular hypertrophy.   2. Left ventricular diastolic parameters are consistent with Grade II  diastolic dysfunction (pseudonormalization).   3. Global right ventricle has normal systolic function.The right  ventricular size is normal. No increase in right ventricular wall  thickness.   4. Left atrial size was moderately dilated.   5. Right atrial size was normal.   6. Moderate mitral annular calcification.   7. The mitral valve is normal in structure. Mild mitral valve  regurgitation. No evidence of mitral stenosis.   8. The tricuspid valve is normal in structure. Tricuspid valve  regurgitation moderate.   9. The aortic valve is normal in structure. Aortic valve regurgitation is  not visualized. No evidence of aortic valve sclerosis or stenosis.  10. The  pulmonic valve was normal in structure. Pulmonic valve  regurgitation is not visualized.  11. Moderately elevated pulmonary artery systolic pressure.  12. The tricuspid regurgitant velocity is 3.56 m/s, and with an assumed  right atrial pressure of 8 mmHg, the estimated right ventricular systolic  pressure is moderately elevated at 58.7 mmHg.  13. The inferior vena cava is normal in size with <50% respiratory  variability, suggesting right atrial pressure of 8 mmHg.   Recent Labs: No results found for requested labs within last 8760 hours.  No results found for requested labs within last 8760 hours.   CrCl cannot be calculated (Patient's most recent lab result is older than the maximum 21 days allowed.).   Wt Readings from Last 3 Encounters:  10/11/21 239 lb (108.4 kg)  02/14/21 229 lb 12.8 oz (104.2 kg)  11/09/20 224 lb 6.4 oz (101.8 kg)     Other studies reviewed: Additional studies/records reviewed today include: summarized above  ASSESSMENT AND PLAN:  1. PPM     Intact function     no programming changes made    2. Paroxysmal >> longstanding persistent Afib     CHA2DS2Vasc is 5, on xarelto, appropriately dosed     >80 % burden     She is rate controlled and asymptomatic     No changes  3. HTN     NO changes today   4. Pain Is better, musculoskeletal after lifting heavy cases of water Pacemaker function is intact   Disposition: remotes as usual, in clinic in 89mo.  She can not do labs today, will have her come in when able for CBC, BMET.   Current medicines are reviewed at length with the patient today.  The patient did not have any concerns regarding medicines.  Venetia Night, PA-C 10/11/2021 5:11 PM     [address] [place] [address] [phone] (office)  [phone] (fax)

## 2021-10-09 NOTE — Telephone Encounter (Signed)
Prescription refill request for Xarelto received.  Indication: a flutter Last office visit: 02/14/2021, [place] Weight: 104.2 kg  Age: 80 yo  Scr:  1.13, 12/21/2020 CrCl: 67 ml/min   Refill sent.

## 2021-10-11 ENCOUNTER — Other Ambulatory Visit: Payer: Self-pay

## 2021-10-11 ENCOUNTER — Ambulatory Visit (INDEPENDENT_AMBULATORY_CARE_PROVIDER_SITE_OTHER): Payer: Medicare Other | Admitting: Physician Assistant

## 2021-10-11 ENCOUNTER — Encounter: Payer: Self-pay | Admitting: Physician Assistant

## 2021-10-11 VITALS — BP 140/64 | HR 80 | Ht 65.0 in | Wt 239.0 lb

## 2021-10-11 DIAGNOSIS — I484 Atypical atrial flutter: Secondary | ICD-10-CM | POA: Diagnosis not present

## 2021-10-11 DIAGNOSIS — I10 Essential (primary) hypertension: Secondary | ICD-10-CM | POA: Diagnosis not present

## 2021-10-11 DIAGNOSIS — I4811 Longstanding persistent atrial fibrillation: Secondary | ICD-10-CM

## 2021-10-11 DIAGNOSIS — Z950 Presence of cardiac pacemaker: Secondary | ICD-10-CM

## 2021-10-11 LAB — CUP PACEART INCLINIC DEVICE CHECK
Battery Remaining Longevity: 146 mo
Battery Voltage: 3.03 V
Brady Statistic AP VP Percent: 0.57 %
Brady Statistic AP VS Percent: 89.79 %
Brady Statistic AS VP Percent: 0.63 %
Brady Statistic AS VS Percent: 9.04 %
Brady Statistic RA Percent Paced: 18.07 %
Brady Statistic RV Percent Paced: 28.21 %
Date Time Interrogation Session: 20221116184042
Implantable Lead Implant Date: 20201207
Implantable Lead Location: 753859
Implantable Lead Location: 753860
Implantable Lead Model: 4076
Implantable Lead Model: 4076
Implantable Pulse Generator Implant Date: 20201207
Lead Channel Impedance Value: 342 Ohm
Lead Channel Impedance Value: 418 Ohm
Lead Channel Impedance Value: 532 Ohm
Lead Channel Impedance Value: 570 Ohm
Lead Channel Pacing Threshold Amplitude: 0.5 V
Lead Channel Pacing Threshold Amplitude: 0.5 V
Lead Channel Pacing Threshold Pulse Width: 0.4 ms
Lead Channel Pacing Threshold Pulse Width: 0.4 ms
Lead Channel Sensing Intrinsic Amplitude: 1 mV
Lead Channel Sensing Intrinsic Amplitude: 1.125 mV
Lead Channel Sensing Intrinsic Amplitude: 21.125 mV
Lead Channel Sensing Intrinsic Amplitude: 23.625 mV
Lead Channel Setting Pacing Amplitude: 1.5 V
Lead Channel Setting Pacing Amplitude: 2.5 V
Lead Channel Setting Pacing Pulse Width: 0.4 ms
Lead Channel Setting Sensing Sensitivity: 0.9 mV

## 2021-10-11 NOTE — Patient Instructions (Signed)
Medication Instructions:   Your physician recommends that you continue on your current medications as directed. Please refer to the Current Medication list given to you today.   *If you need a refill on your cardiac medications before your next appointment, please call your pharmacy*  Labs: RETURN FOR LABS BMET AND CBC     If you have labs (blood work) drawn today and your tests are completely normal, you will receive your results only by: [place] (if you have MyChart) OR A paper copy in the mail If you have any lab test that is abnormal or we need to change your treatment, we will call you to review the results.   Testing/Procedures: NONE ORDERED  TODAY    Follow-Up: At [facility], you and your health needs are our priority.  As part of our continuing mission to provide you with exceptional heart care, we have created designated Provider Care Teams.  These Care Teams include your primary Cardiologist (physician) and Advanced Practice Providers (APPs -  Physician Assistants and Nurse Practitioners) who all work together to provide you with the care you need, when you need it.  We recommend signing up for the patient portal called "MyChart".  Sign up information is provided on this After Visit Summary.  MyChart is used to connect with patients for Virtual Visits (Telemedicine).  Patients are able to view lab/test results, encounter notes, upcoming appointments, etc.  Non-urgent messages can be sent to your provider as well.   To learn more about what you can do with MyChart, go to [email or web address].    Your next appointment:   6 month(s)  The format for your next appointment:   In Person  Provider:   You may see Virl Axe, MD or one of the following Advanced Practice Providers on your designated Care Team:   Tommye Standard, [place] Legrand Como "Jonni Sanger" Chalmers Cater, [place]   Other Instructions

## 2021-10-17 ENCOUNTER — Other Ambulatory Visit: Payer: Medicare Other

## 2021-10-18 ENCOUNTER — Telehealth: Payer: Self-pay | Admitting: Internal Medicine

## 2021-10-18 NOTE — Telephone Encounter (Signed)
Edu pt that monitor company was doing an update nothing to be alarmed about.

## 2021-10-18 NOTE — Telephone Encounter (Signed)
Patient called stating that her remote bedside monitor was flashing orange. Patient states that she is comfortable and denies any associated chest pain, exertional chest pressure/discomfort, dyspnea/tachypnea, paroxysmal nocturnal dyspnea/orthopnea, irregular heart beat/palpitations, presyncope/syncope, lower extremity edema, claudication, or abdominal distention. Advised patient to unplug her monitor and plug it back in which seems to have resolved the issue. If her issue recurs, she will call back in the AM.

## 2021-10-25 ENCOUNTER — Other Ambulatory Visit: Payer: Self-pay

## 2021-10-25 ENCOUNTER — Other Ambulatory Visit: Payer: Medicare Other | Admitting: *Deleted

## 2021-10-25 DIAGNOSIS — I484 Atypical atrial flutter: Secondary | ICD-10-CM

## 2021-10-26 LAB — CBC
Hematocrit: 34.7 % (ref 34.0–46.6)
Hemoglobin: 11.4 g/dL (ref 11.1–15.9)
MCH: 29.7 pg (ref 26.6–33.0)
MCHC: 32.9 g/dL (ref 31.5–35.7)
MCV: 90 fL (ref 79–97)
Platelets: 223 10*3/uL (ref 150–450)
RBC: 3.84 x10E6/uL (ref 3.77–5.28)
RDW: 12.9 % (ref 11.7–15.4)
WBC: 6 10*3/uL (ref 3.4–10.8)

## 2021-10-26 LAB — BASIC METABOLIC PANEL
BUN/Creatinine Ratio: 14 (ref 12–28)
BUN: 14 mg/dL (ref 8–27)
CO2: 24 mmol/L (ref 20–29)
Calcium: 9.5 mg/dL (ref 8.7–10.3)
Chloride: 103 mmol/L (ref 96–106)
Creatinine, Ser: 1 mg/dL (ref 0.57–1.00)
Glucose: 140 mg/dL — ABNORMAL HIGH (ref 70–99)
Potassium: 4.1 mmol/L (ref 3.5–5.2)
Sodium: 146 mmol/L — ABNORMAL HIGH (ref 134–144)
eGFR: 58 mL/min/{1.73_m2} — ABNORMAL LOW (ref >59)

## 2021-11-01 ENCOUNTER — Ambulatory Visit (INDEPENDENT_AMBULATORY_CARE_PROVIDER_SITE_OTHER): Payer: Medicare Other

## 2021-11-01 DIAGNOSIS — I440 Atrioventricular block, first degree: Secondary | ICD-10-CM | POA: Diagnosis not present

## 2021-11-01 LAB — CUP PACEART REMOTE DEVICE CHECK
Battery Remaining Longevity: 145 mo
Battery Voltage: 3.03 V
Brady Statistic RA Percent Paced: 0.11 %
Brady Statistic RV Percent Paced: 40.73 %
Date Time Interrogation Session: 20221207040406
Implantable Lead Implant Date: 20201207
Implantable Lead Location: 753859
Implantable Lead Location: 753860
Implantable Lead Model: 4076
Implantable Lead Model: 4076
Implantable Pulse Generator Implant Date: 20201207
Lead Channel Impedance Value: 342 Ohm
Lead Channel Impedance Value: 418 Ohm
Lead Channel Impedance Value: 532 Ohm
Lead Channel Impedance Value: 570 Ohm
Lead Channel Pacing Threshold Amplitude: 0.5 V
Lead Channel Pacing Threshold Amplitude: 0.5 V
Lead Channel Pacing Threshold Pulse Width: 0.4 ms
Lead Channel Pacing Threshold Pulse Width: 0.4 ms
Lead Channel Sensing Intrinsic Amplitude: 1 mV
Lead Channel Sensing Intrinsic Amplitude: 1 mV
Lead Channel Sensing Intrinsic Amplitude: 20.625 mV
Lead Channel Sensing Intrinsic Amplitude: 20.625 mV
Lead Channel Setting Pacing Amplitude: 1.5 V
Lead Channel Setting Pacing Amplitude: 2.5 V
Lead Channel Setting Pacing Pulse Width: 0.4 ms
Lead Channel Setting Sensing Sensitivity: 0.9 mV

## 2021-11-09 NOTE — Progress Notes (Signed)
Remote pacemaker transmission.   

## 2021-12-25 ENCOUNTER — Other Ambulatory Visit: Payer: Self-pay

## 2021-12-25 DIAGNOSIS — M65311 Trigger thumb, right thumb: Secondary | ICD-10-CM | POA: Insufficient documentation

## 2021-12-25 DIAGNOSIS — M65342 Trigger finger, left ring finger: Secondary | ICD-10-CM | POA: Insufficient documentation

## 2021-12-25 MED ORDER — FUROSEMIDE 20 MG PO TABS: 20.0000 mg | ORAL_TABLET | Freq: Every day | ORAL | 11 refills | Status: DC

## 2021-12-30 ENCOUNTER — Emergency Department (HOSPITAL_COMMUNITY)
Admission: EM | Admit: 2021-12-30 | Discharge: 2021-12-31 | Disposition: A | Discharge status: Home / Self Care | Payer: Medicare Other | Attending: Emergency Medicine | Admitting: Emergency Medicine

## 2021-12-30 ENCOUNTER — Other Ambulatory Visit: Payer: Self-pay

## 2021-12-30 DIAGNOSIS — I10 Essential (primary) hypertension: Secondary | ICD-10-CM | POA: Insufficient documentation

## 2021-12-30 DIAGNOSIS — K068 Other specified disorders of gingiva and edentulous alveolar ridge: Secondary | ICD-10-CM | POA: Diagnosis not present

## 2021-12-30 DIAGNOSIS — Z7984 Long term (current) use of oral hypoglycemic drugs: Secondary | ICD-10-CM | POA: Diagnosis not present

## 2021-12-30 DIAGNOSIS — Z79899 Other long term (current) drug therapy: Secondary | ICD-10-CM | POA: Diagnosis not present

## 2021-12-30 DIAGNOSIS — Z7901 Long term (current) use of anticoagulants: Secondary | ICD-10-CM | POA: Diagnosis not present

## 2021-12-30 DIAGNOSIS — K91840 Postprocedural hemorrhage and hematoma of a digestive system organ or structure following a digestive system procedure: Secondary | ICD-10-CM | POA: Insufficient documentation

## 2021-12-30 DIAGNOSIS — K0889 Other specified disorders of teeth and supporting structures: Secondary | ICD-10-CM | POA: Diagnosis present

## 2021-12-30 DIAGNOSIS — E119 Type 2 diabetes mellitus without complications: Secondary | ICD-10-CM | POA: Insufficient documentation

## 2021-12-30 LAB — CBC
HCT: 36 % (ref 36.0–46.0)
Hemoglobin: 11.6 g/dL — ABNORMAL LOW (ref 12.0–15.0)
MCH: 30.1 pg (ref 26.0–34.0)
MCHC: 32.2 g/dL (ref 30.0–36.0)
MCV: 93.5 fL (ref 80.0–100.0)
Platelets: 248 10*3/uL (ref 150–400)
RBC: 3.85 MIL/uL — ABNORMAL LOW (ref 3.87–5.11)
RDW: 14 % (ref 11.5–15.5)
WBC: 8.1 10*3/uL (ref 4.0–10.5)
nRBC: 0 % (ref 0.0–0.2)

## 2021-12-30 NOTE — ED Provider Triage Note (Signed)
Emergency Medicine Provider Triage Evaluation Note  Darlene Boyer , a 81 y.o. female  was evaluated in triage.  Pt complains of spontaneous bleeding that started about 1 hour ago.  She states prior to the bleeding she ate soup but nothing solid.  Her teeth pulled 4 days ago.  She is on xarelto with the last dose.  She denies pain.  Review of Systems  Positive: As above Negative: As above  Physical Exam  BP (!) 161/97 (BP Location: Right Arm)    Pulse (!) 111    Temp 99.9 F (37.7 C) (Oral)    Resp 18    SpO2 97%  Gen:   Awake, no distress   Resp:  Normal effort  MSK:   Moves extremities without difficulty  Other:  Bleeding and blood clot noted in the oral cavity.  Active bleeding noted on sensor right.  4 x 4 gauze placed  Medical Decision Making  Medically screening exam initiated at 11:00 PM.  Appropriate orders placed.  Darlene Boyer was informed that the remainder of the evaluation will be completed by another provider, this initial triage assessment does not replace that evaluation, and the importance of remaining in the ED until their evaluation is complete.     Evlyn Courier, PA-C 12/30/21 2303

## 2021-12-30 NOTE — ED Triage Notes (Signed)
Pt here for excessive bleeding from mouth after she had 5 teeth removed 4 days ago. Pt states bleeding started about an hour ago, she is on blood thinners (last dose last night), Pt denies pain but has been spitting out blood clots

## 2021-12-31 ENCOUNTER — Encounter (HOSPITAL_COMMUNITY): Payer: Self-pay | Admitting: Emergency Medicine

## 2021-12-31 NOTE — ED Provider Notes (Addendum)
[facility] EMERGENCY DEPARTMENT Provider Note   CSN: [id number] Arrival date & time: 12/30/21  2238     History  Chief Complaint  Patient presents with   bleeding after teeth being pulled    Darlene Boyer is a 81 y.o. female.  The history is provided by the patient.  Illness Location:  Lower front tooth extractions sites Quality:  Bleeding Severity:  Mild Onset quality:  Gradual Duration:  2 hours Timing:  Constant Progression:  Partially resolved Chronicity:  New Context:  Dental extractions on xarelto on 1/31 Relieved by:  Nothing Worsened by:  Nothing Ineffective treatments:  None tried Associated symptoms: no abdominal pain, no chest pain, no congestion, no cough, no diarrhea, no ear pain, no fatigue, no fever, no headaches, no loss of consciousness, no myalgias, no nausea, no rash, no rhinorrhea, no shortness of breath, no sore throat, no vomiting and no wheezing   Risk factors:  DOAC     Home Medications Prior to Admission medications   Medication Sig Start Date End Date Taking? Authorizing Provider  dextromethorphan-guaiFENesin (MUCINEX DM) 30-600 MG 12hr tablet Take 1 tablet by mouth as needed for cough.     [provider]  diclofenac Sodium (VOLTAREN) 1 % GEL Apply 4 g topically 4 (four) times daily. 10/08/21   Scot Jun, FNP  diltiazem (CARDIZEM CD) 120 MG 24 hr capsule TAKE 1 CAPSULE BY MOUTH EVERY DAY 10/09/21   Minus Breeding, MD  furosemide (LASIX) 20 MG tablet Take 1 tablet (20 mg total) by mouth daily. 12/25/21   Minus Breeding, MD  levothyroxine (SYNTHROID, LEVOTHROID) 100 MCG tablet Take 100 mcg by mouth daily before breakfast.    [provider]  loratadine (CLARITIN) 10 MG tablet Take 10 mg by mouth every other day.    [provider]  losartan (COZAAR) 50 MG tablet Take 50 mg by mouth daily.    [provider]  metFORMIN (GLUCOPHAGE) 1000 MG tablet Take 1,500 mg by mouth daily.      [provider]  metoprolol succinate (TOPROL-XL) 100 MG 24 hr tablet Take 100 mg by mouth daily. 04/04/16   [provider]  montelukast (SINGULAIR) 10 MG tablet Take 10 mg by mouth daily. 06/06/20   [provider]  Multiple Vitamins-Minerals (WOMENS 50+ [place] VITAMIN/MIN PO) Take 1 tablet by mouth daily.    [provider]  tiZANidine (ZANAFLEX) 2 MG tablet Take 1 tablet (2 mg total) by mouth at bedtime as needed for muscle spasms. 10/08/21   Scot Jun, FNP  XARELTO 20 MG TABS tablet TAKE 1 TABLET BY MOUTH DAILY WITH SUPPER 10/09/21   Minus Breeding, MD      Allergies    Patient has no known allergies.    Review of Systems   Review of Systems  Constitutional:  Negative for fatigue and fever.  HENT:  Negative for congestion, ear pain, rhinorrhea and sore throat.   Eyes:  Negative for redness.  Respiratory:  Negative for cough, shortness of breath and wheezing.   Cardiovascular:  Negative for chest pain.  Gastrointestinal:  Negative for abdominal pain, diarrhea, nausea and vomiting.  Genitourinary:  Negative for difficulty urinating.  Musculoskeletal:  Negative for myalgias.  Skin:  Negative for rash.  Neurological:  Negative for loss of consciousness and headaches.  Psychiatric/Behavioral:  Negative for agitation.   All other systems reviewed and are negative.  Physical Exam Updated Vital Signs BP (!) 158/71 (BP Location: Right Arm)  Pulse 64    Temp 99.4 F (37.4 C) (Oral)    Resp 17    Ht 5\' 5"  (1.651 m)    Wt 105.2 kg    SpO2 100%    BMI 38.61 kg/m  Physical Exam Vitals and nursing note reviewed.  Constitutional:      General: She is not in acute distress.    Appearance: Normal appearance.  HENT:     Head: Normocephalic and atraumatic.     Nose: Nose normal.     Mouth/Throat:     Comments: Clots in sockets of lower front teeth  Eyes:     Conjunctiva/sclera: Conjunctivae normal.     Pupils: Pupils are equal, round, and  reactive to light.  Cardiovascular:     Rate and Rhythm: Normal rate and regular rhythm.     Pulses: Normal pulses.     Heart sounds: Normal heart sounds.  Pulmonary:     Effort: Pulmonary effort is normal.     Breath sounds: Normal breath sounds.  Abdominal:     General: Abdomen is flat. Bowel sounds are normal.     Palpations: Abdomen is soft.     Tenderness: There is no abdominal tenderness. There is no guarding.  Musculoskeletal:        General: Normal range of motion.     Cervical back: Normal range of motion and neck supple.  Skin:    General: Skin is warm and dry.     Capillary Refill: Capillary refill takes less than 2 seconds.  Neurological:     General: No focal deficit present.     Mental Status: She is alert and oriented to person, place, and time.     Deep Tendon Reflexes: Reflexes normal.  Psychiatric:        Mood and Affect: Mood normal.        Behavior: Behavior normal.    ED Results / Procedures / Treatments   Labs (all labs ordered are listed, but only abnormal results are displayed) Labs Reviewed  CBC - Abnormal; Notable for the following components:      Result Value   RBC 3.85 (*)    Hemoglobin 11.6 (*)    All other components within normal limits    EKG None  Radiology No results found.  Procedures Procedures    Medications Ordered in ED Medications - No data to display  ED Course/ Medical Decision Making/ A&P                           Medical Decision Making  This patient presents to the ED for concern of dental bleeding this involves an extensive number of treatment options, and is a complaint that carries with it a high risk of complications and morbidity.  The differential diagnosis includes dental bleeding secondary to DOAC.     Co morbidities that complicate the patient evaluation  Past Medical History:  Diagnosis Date   Arthritis    Atrial fibrillation ([place])    Diabetes mellitus without complication ([place])    Hypertension     Obesity    Thyroid disease       Additional history obtained:  Additional history obtained from Epic and care everywhere  External records from outside source obtained and reviewed including chart    Lab Tests:  I Ordered, and personally interpreted labs.  The pertinent results include:  CBC which is actually slightly better than previous  Medicines ordered and prescription drug management:  I ordered medication including tea bag for use of tannic acid  Reevaluation of the patient after these medicines showed that the patient resolved I have reviewed the patients home medicines and have made adjustments as needed   Test Considered:  Pt INR   Critical Interventions:  Tea bag therapy      Problem List / ED Course:  Dental bleeding    Reevaluation:  After the interventions noted above, I reevaluated the patient and found that they have :resolved   Social Determinants of Health:  none   Dispostion:  After consideration of the diagnostic results and the patients response to treatment, I feel that the patent would benefit from close follow up with dentistry   Final Clinical Impression(s) / ED Diagnoses Final diagnoses:  None   Return for intractable cough, coughing up blood, fevers > 100.4 unrelieved by medication, shortness of breath, intractable vomiting, chest pain, shortness of breath, weakness, numbness, changes in speech, facial asymmetry, abdominal pain, passing out, Inability to tolerate liquids or food, cough, altered mental status or any concerns. No signs of systemic illness or infection. The patient is nontoxic-appearing on exam and vital signs are within normal limits.  I have reviewed the triage vital signs and the nursing notes. Pertinent labs & imaging results that were available during my care of the patient were reviewed by me and considered in my medical decision making (see chart for details). After history, exam, and medical workup  I feel the patient has been appropriately medically screened and is safe for discharge home. Pertinent diagnoses were discussed with the patient. Patient was given return precautions. Rx / DC Orders ED Discharge Orders     None         Prachi Oftedahl, MD 12/31/21 0865    Randal Buba, Argyle Gustafson, MD 12/31/21 7846

## 2021-12-31 NOTE — ED Notes (Signed)
Tea bags applied to pts mouth by ED provider for bleeding.

## 2021-12-31 NOTE — ED Notes (Signed)
E-signature pad unavailable at time of pt discharge. This RN discussed discharge materials with pt and answered all pt questions. Pt stated understanding of discharge material. ? ?

## 2021-12-31 NOTE — ED Notes (Signed)
RN removed tea bag from pts mouth. Site of initial bleeding is no longer actively bleeding at this time. ED provider aware. Pt ambulated self to bathroom

## 2022-01-08 ENCOUNTER — Other Ambulatory Visit: Payer: Self-pay

## 2022-01-08 ENCOUNTER — Encounter: Payer: Self-pay | Admitting: Medical

## 2022-01-08 ENCOUNTER — Ambulatory Visit (INDEPENDENT_AMBULATORY_CARE_PROVIDER_SITE_OTHER): Payer: Medicare Other | Admitting: Medical

## 2022-01-08 VITALS — BP 110/60 | HR 105 | Ht 63.0 in | Wt 230.6 lb

## 2022-01-08 DIAGNOSIS — E118 Type 2 diabetes mellitus with unspecified complications: Secondary | ICD-10-CM | POA: Diagnosis not present

## 2022-01-08 DIAGNOSIS — M542 Cervicalgia: Secondary | ICD-10-CM | POA: Diagnosis not present

## 2022-01-08 DIAGNOSIS — E079 Disorder of thyroid, unspecified: Secondary | ICD-10-CM | POA: Diagnosis not present

## 2022-01-08 DIAGNOSIS — Z7901 Long term (current) use of anticoagulants: Secondary | ICD-10-CM | POA: Diagnosis not present

## 2022-01-08 DIAGNOSIS — I10 Essential (primary) hypertension: Secondary | ICD-10-CM | POA: Diagnosis not present

## 2022-01-08 DIAGNOSIS — Z950 Presence of cardiac pacemaker: Secondary | ICD-10-CM

## 2022-01-08 DIAGNOSIS — I4819 Other persistent atrial fibrillation: Secondary | ICD-10-CM | POA: Diagnosis not present

## 2022-01-08 DIAGNOSIS — E039 Hypothyroidism, unspecified: Secondary | ICD-10-CM

## 2022-01-08 LAB — POCT GLYCOSYLATED HEMOGLOBIN (HGB A1C): Hemoglobin A1C: 7.3 % — AB (ref 4.0–5.6)

## 2022-01-08 MED ORDER — METOPROLOL SUCCINATE ER 100 MG PO TB24: 100.0000 mg | ORAL_TABLET | Freq: Every day | ORAL | 1 refills | Status: DC

## 2022-01-08 MED ORDER — METFORMIN HCL 1000 MG PO TABS: 1000.0000 mg | ORAL_TABLET | Freq: Every day | ORAL | 1 refills | Status: DC

## 2022-01-08 MED ORDER — TIZANIDINE HCL 4 MG PO TABS: 4.0000 mg | ORAL_TABLET | Freq: Every day | ORAL | 0 refills | Status: DC

## 2022-01-08 MED ORDER — LOSARTAN POTASSIUM 50 MG PO TABS: 50.0000 mg | ORAL_TABLET | Freq: Every day | ORAL | 1 refills | Status: DC

## 2022-01-08 NOTE — Progress Notes (Signed)
Subjective:  Darlene Boyer is a 81 y.o. female who presents for Chief Complaint  Patient presents with   new pt     New pt get established. Neck has muscle tension in it     Here as a new patient.  Was seeing [facility] physicians prior  She has a history of diabetes type 2, hypothyroidism, hypertension, atrial fibrillation, history of pacemaker, on long-term anticoagulation.  She notes that she has not had a hemoglobin A1c in over a year.  She is not currently checking her blood sugars but she does have a glucometer.  She is currently taking metformin 1000 mg daily.  No foot concerns.  No polyuria, polydipsia, blurred vision  She has never been on a statin  Hypertension-compliant with medication  Atrial fibrillation, history of pacemaker-sees cardiology regularly.  No recent concerns  Sees Dr. Percival Spanish and Dr. Caryl Comes with cardiology  She notes some left-sided neck pain.  No injury no trauma.  No arm pain.  No radicular numbness tingling or weakness.  Not currently exercising  No other aggravating or relieving factors.    No other c/o.  Past Medical History:  Diagnosis Date   Arthritis    Atrial fibrillation ([place])    Diabetes mellitus without complication ([place])    Hypertension    Obesity    Thyroid disease    Current Outpatient Medications on File Prior to Visit  Medication Sig Dispense Refill   diltiazem (CARDIZEM CD) 120 MG 24 hr capsule TAKE 1 CAPSULE BY MOUTH EVERY DAY 90 capsule 3   furosemide (LASIX) 20 MG tablet Take 1 tablet (20 mg total) by mouth daily. 30 tablet 11   levothyroxine (SYNTHROID, LEVOTHROID) 100 MCG tablet Take 100 mcg by mouth daily before breakfast.     montelukast (SINGULAIR) 10 MG tablet Take 10 mg by mouth daily.     XARELTO 20 MG TABS tablet TAKE 1 TABLET BY MOUTH DAILY WITH SUPPER 90 tablet 1   loratadine (CLARITIN) 10 MG tablet Take 10 mg by mouth every other day.     No current facility-administered medications on file prior to visit.      The following portions of the patient's history were reviewed and updated as appropriate: allergies, current medications, past family history, past medical history, past social history, past surgical history and problem list.  ROS Otherwise as in subjective above  Objective: BP 110/60    Pulse (!) 105    Ht 5\' 3"  (1.6 m)    Wt 230 lb 9.6 oz (104.6 kg)    BMI 40.85 kg/m   General appearance: alert, no distress, well developed, well nourished, African-American female Neck: Mild left-sided tenderness, range of motion slightly decreased with range of motion in general, no other deformity, no masses, no thyromegaly Arms nontender without deformity or swelling, seemingly normal range of motion  Strength and sensation of arms within normal limits  Irregularly irregular heart rate, no murmurs Lungs: CTA bilaterally, no wheezes, rhonchi, or rales Pulses: 2+ radial pulses, 2+ pedal pulses, normal cap refill Ext: no edema   Assessment: Encounter Diagnoses  Name Primary?   Hypothyroidism, unspecified type Yes   Type 2 diabetes mellitus with complication, without long-term current use of insulin (HCC)    Thyroid disease    Primary hypertension    Persistent atrial fibrillation (HCC)    Neck pain    Pacemaker    Current use of long term anticoagulation      Plan: Hypothyroidism-updated labs today, continue thyroid medication  daily  Type 2 diabetes-hemoglobin A1c 7.3% today.  Continue metformin 1000 mg daily.  Counseled on diet and exercise.  Advised glucometer testing at home.  She has a glucometer and supplies.  Discussed the need for routine follow-up, daily foot checks, yearly eye doctor visit.  Atrial fibrillatin, pacemaker in place, long term anticoagulation with xarelto -continue routine follow-up with cardiology, continue routine medications  Pending labs will likely recommend statin.  We discussed standard of care for diabetics  Neck pain without radicular arm symptoms.   Likely some arthritis or muscle tension.  Discussed the need to do some stretches regularly, discussed range of motion activity as demonstrated.  Can use Tylenol as needed.  Avoid NSAIDs.  She wanted a muscle relaxer.  We discussed the risk and benefits.  Advise she use these sparingly.  We also discussed the need to avoid falls.  Use only in the evening home.    Long-term anticoagulation on Xarelto.  Avoid NSAIDs.  Anber was seen today for new pt .  Diagnoses and all orders for this visit:  Hypothyroidism, unspecified type -     TSH + free T4  Type 2 diabetes mellitus with complication, without long-term current use of insulin (HCC) -     HgB A1c -     Lipid panel -     Microalbumin/Creatinine Ratio, Urine  Thyroid disease -     TSH + free T4  Primary hypertension -     Lipid panel  Persistent atrial fibrillation (HCC)  Neck pain  Pacemaker  Current use of long term anticoagulation  Other orders -     tiZANidine (ZANAFLEX) 4 MG tablet; Take 1 tablet (4 mg total) by mouth at bedtime. -     metoprolol succinate (TOPROL-XL) 100 MG 24 hr tablet; Take 1 tablet (100 mg total) by mouth daily. -     losartan (COZAAR) 50 MG tablet; Take 1 tablet (50 mg total) by mouth daily. -     metFORMIN (GLUCOPHAGE) 1000 MG tablet; Take 1 tablet (1,000 mg total) by mouth daily.    Follow up: pending labs

## 2022-01-09 ENCOUNTER — Other Ambulatory Visit: Payer: Self-pay | Admitting: Medical

## 2022-01-09 DIAGNOSIS — Z79899 Other long term (current) drug therapy: Secondary | ICD-10-CM

## 2022-01-09 DIAGNOSIS — E039 Hypothyroidism, unspecified: Secondary | ICD-10-CM

## 2022-01-09 LAB — LIPID PANEL
Chol/HDL Ratio: 2.3 ratio (ref 0.0–4.4)
Cholesterol, Total: 185 mg/dL (ref 100–199)
HDL: 81 mg/dL
LDL Chol Calc (NIH): 92 mg/dL (ref 0–99)
Triglycerides: 61 mg/dL (ref 0–149)
VLDL Cholesterol Cal: 12 mg/dL (ref 5–40)

## 2022-01-09 LAB — TSH+FREE T4
Free T4: 1.93 ng/dL — ABNORMAL HIGH (ref 0.82–1.77)
TSH: 0.519 u[IU]/mL (ref 0.450–4.500)

## 2022-01-09 LAB — MICROALBUMIN / CREATININE URINE RATIO
Creatinine, Urine: 159.4 mg/dL
Microalb/Creat Ratio: 17 mg/g{creat} (ref 0–29)
Microalbumin, Urine: 27 ug/mL

## 2022-01-09 MED ORDER — LEVOTHYROXINE SODIUM 88 MCG PO TABS: 88.0000 ug | ORAL_TABLET | Freq: Every day | ORAL | 2 refills | Status: DC

## 2022-01-09 MED ORDER — ROSUVASTATIN CALCIUM 10 MG PO TABS: 10.0000 mg | ORAL_TABLET | Freq: Every day | ORAL | 3 refills | Status: DC

## 2022-01-10 ENCOUNTER — Encounter: Payer: Self-pay | Admitting: Internal Medicine

## 2022-01-18 IMAGING — CT CT L SPINE W/O CM
3 of 4 series · 13 of 33 positions shown, 16 images · non-contrast
Comparison: Back and left leg pain.

CLINICAL DATA: Back and left leg pain.

EXAM:
CT LUMBAR SPINE WITHOUT CONTRAST
TECHNIQUE: Multidetector CT imaging of the lumbar spine was performed without
intravenous contrast administration. Multiplanar CT image
reconstructions were also generated.

[Series 3: l-spine 2.00 br40 s3 lspine st · axial · 0.36mm/px · z∈[+1399,+1551]mm · 5 of 116 slices shown, 7 images]
[im 20/116  soft-tissue]
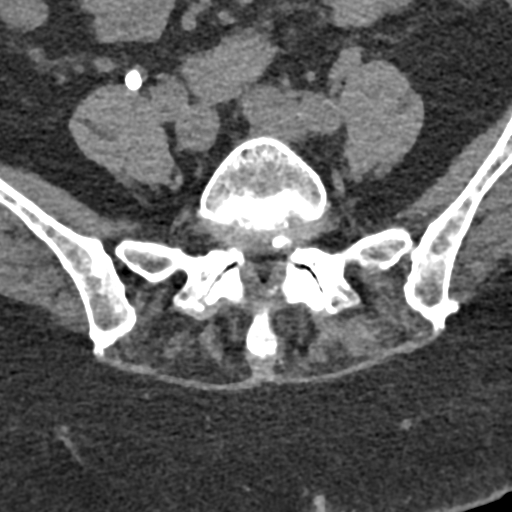
[im 20/116  bone]
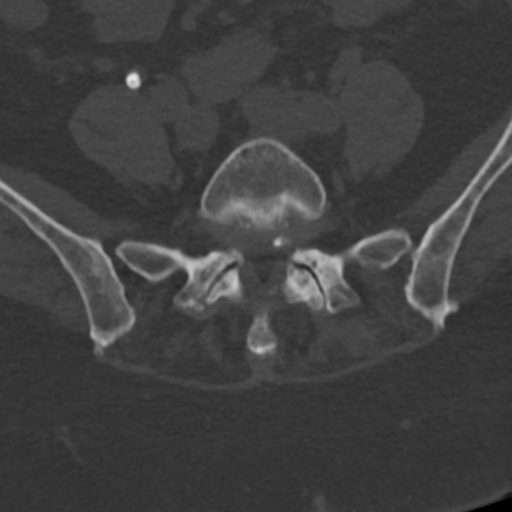
[im 39/116  bone]
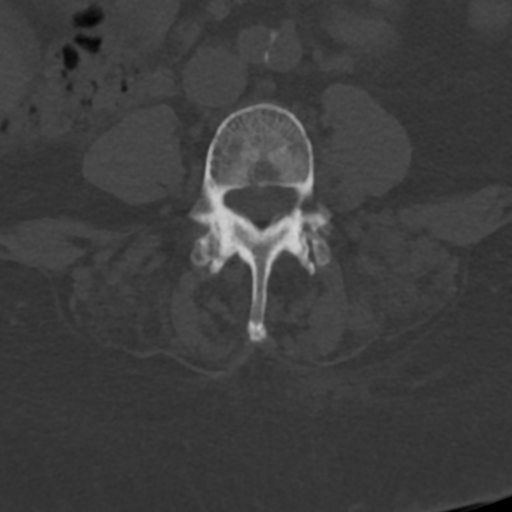
[im 58/116  bone]
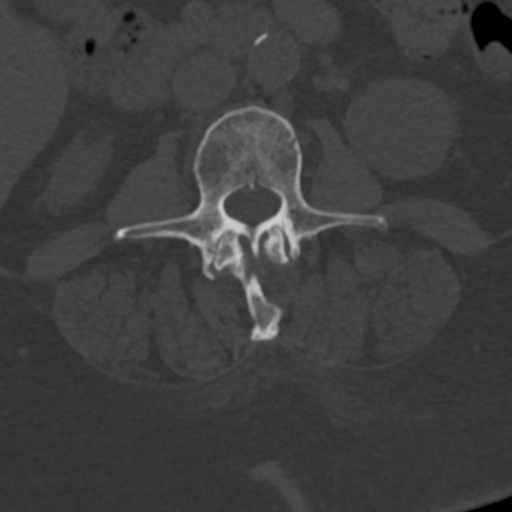
[im 77/116  bone]
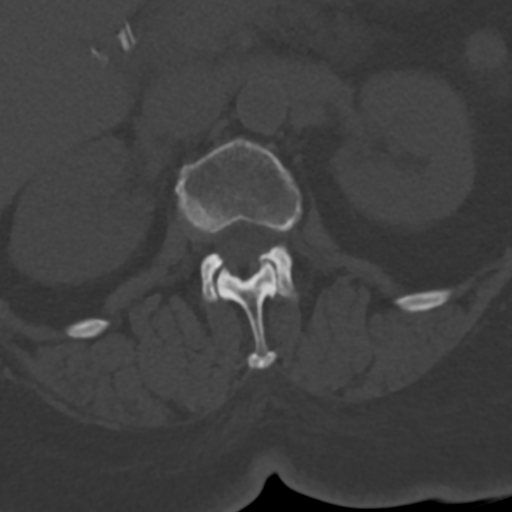
[im 96/116  soft-tissue]
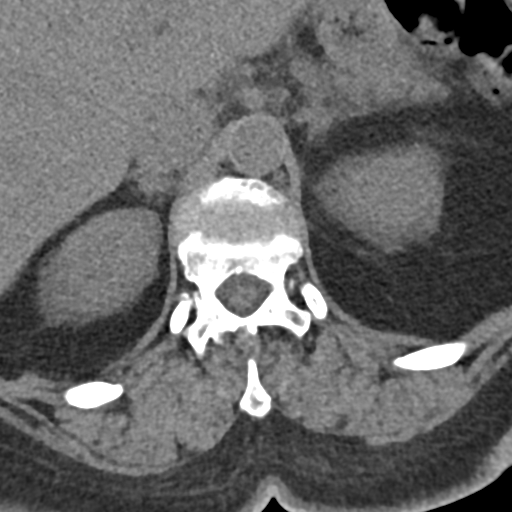
[im 96/116  bone]
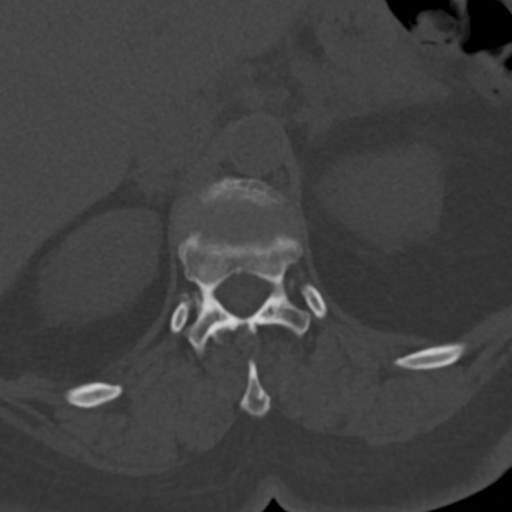

[Series 5: l-spine 2.00 br60 s3 sag bone · sagittal · 0.36mm/px · 5 of 93 slices shown, 6 images]
[im 31/93  bone]
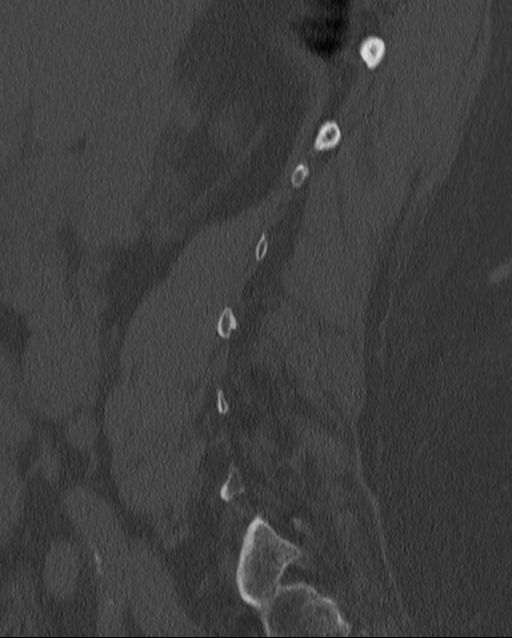
[im 39/93  bone]
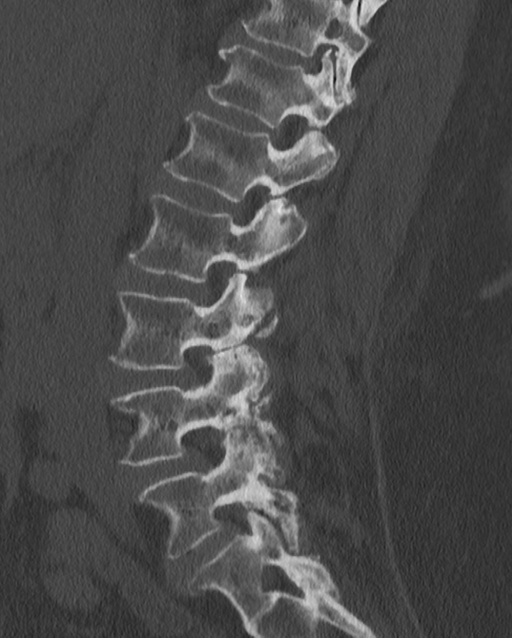
[im 47/93  soft-tissue]
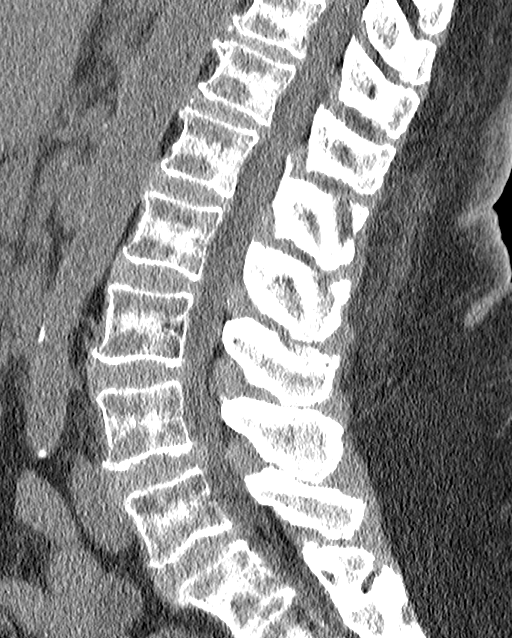
[im 47/93  bone]
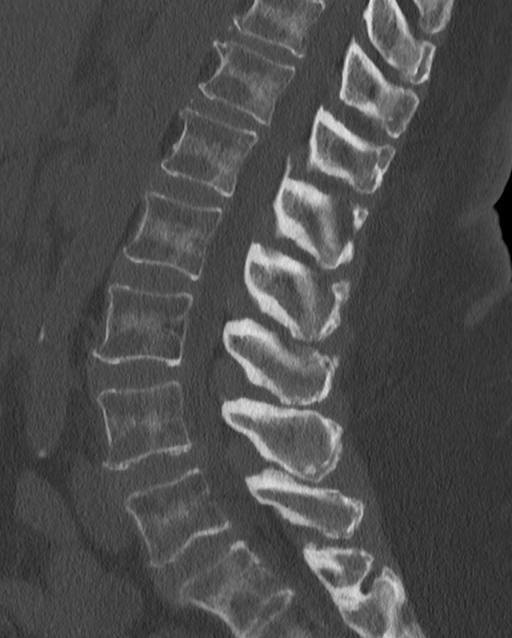
[im 54/93  bone]
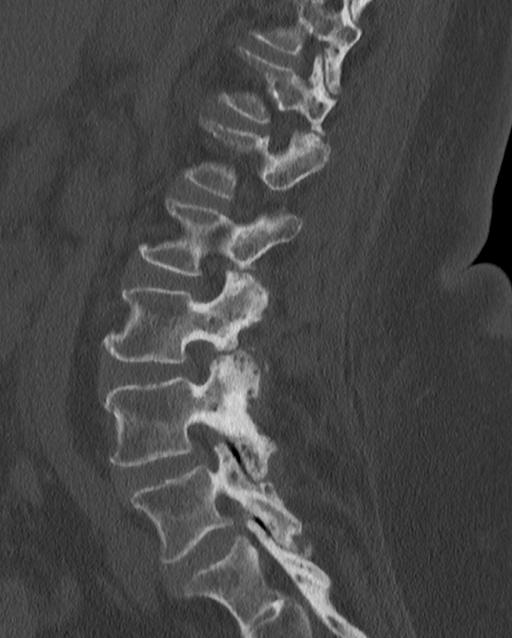
[im 62/93  bone]
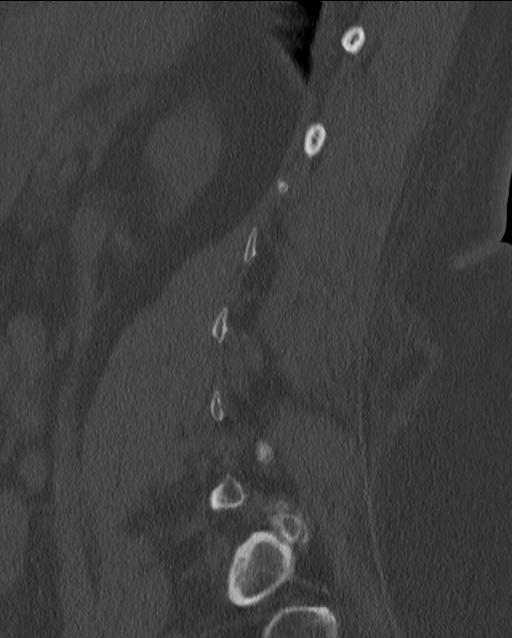

[Series 7: l-spine 2.00 br60 s3 cor bone · coronal · 0.36mm/px · 3 of 93 slices shown]
[im 19/93  bone]
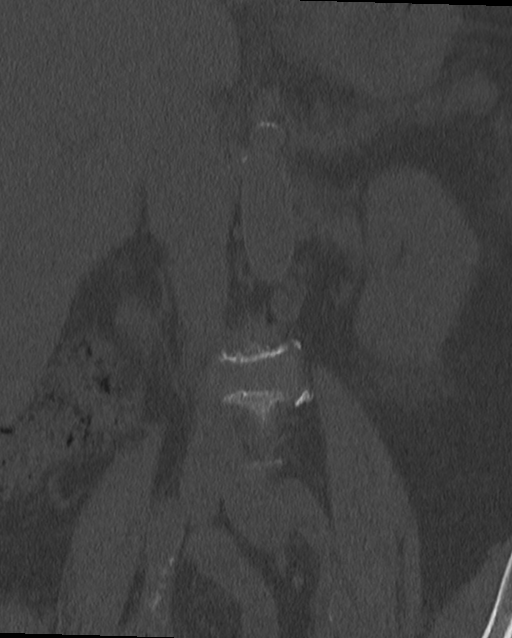
[im 37/93  bone]
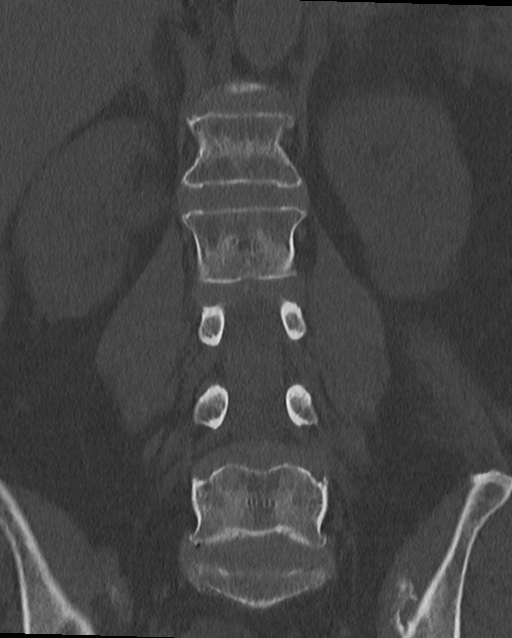
[im 56/93  bone]
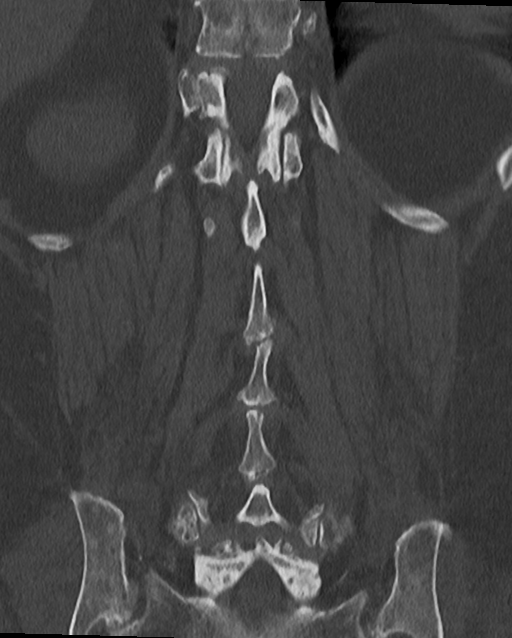

[13 of 33 positions shown; findings below may reference images not displayed]

FINDINGS: Segmentation: There are five lumbar type vertebral bodies. The last
full intervertebral disc space is labeled L5-S1. Small remnant disc
space noted at S1-2.

Alignment: Normal

Vertebrae: Osteoporosis is noted. No fractures or bone lesions.
Significant/advanced lumbar facet disease without definite pars
defects. The spinous processes are also enlarged and appear to be
nearly touching which could suggest Baastrup's syndrome.

Paraspinal and other soft tissues: No significant paraspinal or
retroperitoneal findings. Minimal scattered aortic and iliac artery
calcifications for age. The appendix and ovaries appear normal.

Disc levels:

L1-2: No significant findings.  Mild facet disease.

L2-3: Diffuse annular bulge, moderate facet disease and ligamentum
flavum thickening contributing to moderate spinal and bilateral
lateral recess stenosis. No significant foraminal stenosis, mild
foraminal encroachment bilaterally.

L3-4: Diffuse bulging degenerated annulus, short pedicles, advanced
facet disease and ligamentum flavum thickening contributing to
severe spinal and bilateral lateral recess stenosis. No significant
foraminal stenosis.

L4-5: Diffuse bulging annulus, short pedicles, advanced facet
disease and ligamentum flavum thickening contributing to severe
spinal and bilateral lateral recess stenosis. There is also a left
foraminal disc protrusion likely affecting and slightly displacing
the left L4 nerve root.

L5-S1: Bulging annulus and osteophytic ridging with mild spinal
stenosis. There is a left-sided partially calcified disc protrusion
contributing to left lateral recess stenosis and possibly affecting
the left S1 nerve root. The L5 nerve roots appear normal.
IMPRESSION: 1. Advanced degenerative lumbar facet disease without definite pars
defects.
2. Moderate spinal and bilateral lateral recess stenosis at L2-3.
3. Severe spinal and bilateral lateral recess stenosis at L3-4 and
L4-5.
4. Left foraminal disc protrusion at L4-5 likely affecting and
slightly displacing the left L4 nerve root.
5. Left-sided partially calcified disc protrusion at L5-S1
contributing to left lateral recess stenosis and possibly affecting
the left S1 nerve root.
6. Osteoporosis.
7. The spinous processes are also enlarged and appear to be nearly
touching which could suggest Baastrup's syndrome.

## 2022-01-31 ENCOUNTER — Ambulatory Visit (INDEPENDENT_AMBULATORY_CARE_PROVIDER_SITE_OTHER): Payer: Medicare Other

## 2022-01-31 DIAGNOSIS — I440 Atrioventricular block, first degree: Secondary | ICD-10-CM | POA: Diagnosis not present

## 2022-01-31 LAB — CUP PACEART REMOTE DEVICE CHECK
Battery Remaining Longevity: 142 mo
Battery Voltage: 3.02 V
Brady Statistic AP VP Percent: 0.52 %
Brady Statistic AP VS Percent: 0.4 %
Brady Statistic AS VP Percent: 12.2 %
Brady Statistic AS VS Percent: 86.43 %
Brady Statistic RA Percent Paced: 0.34 %
Brady Statistic RV Percent Paced: 31.82 %
Date Time Interrogation Session: 20230308043830
Implantable Lead Implant Date: 20201207
Implantable Lead Location: 753859
Implantable Lead Location: 753860
Implantable Lead Model: 4076
Implantable Lead Model: 4076
Implantable Pulse Generator Implant Date: 20201207
Lead Channel Impedance Value: 342 Ohm
Lead Channel Impedance Value: 418 Ohm
Lead Channel Impedance Value: 494 Ohm
Lead Channel Impedance Value: 532 Ohm
Lead Channel Pacing Threshold Amplitude: 0.5 V
Lead Channel Pacing Threshold Amplitude: 0.5 V
Lead Channel Pacing Threshold Pulse Width: 0.4 ms
Lead Channel Pacing Threshold Pulse Width: 0.4 ms
Lead Channel Sensing Intrinsic Amplitude: 0.875 mV
Lead Channel Sensing Intrinsic Amplitude: 0.875 mV
Lead Channel Sensing Intrinsic Amplitude: 22.125 mV
Lead Channel Sensing Intrinsic Amplitude: 22.125 mV
Lead Channel Setting Pacing Amplitude: 1.5 V
Lead Channel Setting Pacing Amplitude: 2.5 V
Lead Channel Setting Pacing Pulse Width: 0.4 ms
Lead Channel Setting Sensing Sensitivity: 0.9 mV

## 2022-02-01 ENCOUNTER — Encounter: Payer: Self-pay | Admitting: Medical

## 2022-02-13 NOTE — Progress Notes (Signed)
Remote pacemaker transmission.   

## 2022-02-15 NOTE — Progress Notes (Deleted)
?  ? ?Cardiology Office Note ? ? ?Date:  02/15/2022  ? ?ID:  Darlene Boyer, DOB 12-Jun-1941, MRN [id number] ? ?PCP:  Carlena Hurl, PA-C  ?Cardiologist:   Minus Breeding, MD  ? ?No chief complaint on file. ? ? ?  ?History of Present Illness: ?Darlene Boyer is a 81 y.o. female who presents for evaluation of atrial fibrillation.  She had a pacemaker in Dec 2020.  Since I last saw her *** ? ? ?***  she has done well.  She denies any new cardiovascular symptoms.  Since adding Cardizem last year she gets the palpitations she was having.  She is not overly physically active if she walks to the mailbox and to the garbage can in her apartment complex.  The patient denies any new symptoms such as chest discomfort, neck or arm discomfort. There has been no new shortness of breath, PND or orthopnea. There have been no reported palpitations, presyncope or syncope.   I did follow up with an echocardiogram.  She has tricuspid regurgitation.  She has moderate pulmonary hypertension.  It has been listed as severe before.  He has some mildly reduced RV function. ? ?Past Medical History:  ?Diagnosis Date  ? Arthritis   ? Atrial fibrillation ([place])   ? Diabetes mellitus without complication ([place])   ? Hypertension   ? Obesity   ? Thyroid disease   ? ? ?Past Surgical History:  ?Procedure Laterality Date  ? ABDOMINAL HYSTERECTOMY    ? partial  ? ANKLE FRACTURE SURGERY Right   ? COLONOSCOPY  01/27/2020  ? EYE SURGERY    ? Lens implants  ? FOOT SURGERY Left   ? Pins in 3 toes  ? GALLBLADDER SURGERY    ? PACEMAKER IMPLANT N/A 11/02/2019  ? Procedure: PACEMAKER IMPLANT;  Surgeon: Deboraha Sprang, MD;  Location: [facility];  Service: Cardiovascular;  Laterality: N/A;  ? TUBAL LIGATION    ? UPPER GASTROINTESTINAL ENDOSCOPY  01/27/2020  ? ? ? ?Current Outpatient Medications  ?Medication Sig Dispense Refill  ? diltiazem (CARDIZEM CD) 120 MG 24 hr capsule TAKE 1 CAPSULE BY MOUTH EVERY DAY 90 capsule 3  ? furosemide (LASIX) 20 MG  tablet Take 1 tablet (20 mg total) by mouth daily. 30 tablet 11  ? levothyroxine (SYNTHROID) 88 MCG tablet Take 1 tablet (88 mcg total) by mouth daily. 30 tablet 2  ? loratadine (CLARITIN) 10 MG tablet Take 10 mg by mouth every other day.    ? losartan (COZAAR) 50 MG tablet Take 1 tablet (50 mg total) by mouth daily. 90 tablet 1  ? metFORMIN (GLUCOPHAGE) 1000 MG tablet Take 1 tablet (1,000 mg total) by mouth daily. 90 tablet 1  ? metoprolol succinate (TOPROL-XL) 100 MG 24 hr tablet Take 1 tablet (100 mg total) by mouth daily. 90 tablet 1  ? montelukast (SINGULAIR) 10 MG tablet Take 10 mg by mouth daily.    ? rosuvastatin (CRESTOR) 10 MG tablet Take 1 tablet (10 mg total) by mouth daily. 90 tablet 3  ? tiZANidine (ZANAFLEX) 4 MG tablet Take 1 tablet (4 mg total) by mouth at bedtime. 12 tablet 0  ? XARELTO 20 MG TABS tablet TAKE 1 TABLET BY MOUTH DAILY WITH SUPPER 90 tablet 1  ? ?No current facility-administered medications for this visit.  ? ? ?Allergies:   Patient has no known allergies.  ? ?ROS:  Please see the history of present illness.   Otherwise, review of systems are positive  for ***.  ? ?PHYSICAL EXAM: ?VS:  There were no vitals taken for this visit. , BMI There is no height or weight on file to calculate BMI.  ?GENERAL:  Well appearing ?NECK:  No jugular venous distention, waveform within normal limits, carotid upstroke brisk and symmetric, no bruits, no thyromegaly ?LUNGS:  Clear to auscultation bilaterally ?CHEST:  Unremarkable ?HEART:  PMI not displaced or sustained,S1 and S2 within normal limits, no S3, no clicks, no rubs, *** murmurs, irregular  ?ABD:  Flat, positive bowel sounds normal in frequency in pitch, no bruits, no rebound, no guarding, no midline pulsatile mass, no hepatomegaly, no splenomegaly ?EXT:  2 plus pulses throughout, no edema, no cyanosis no clubbing ? ? ? ?***GENERAL:  Well appearing ?NECK:  Positive jugular venous distention, waveform within normal limits, carotid upstroke brisk  and symmetric, no bruits, no thyromegaly ?LUNGS:  Clear to auscultation bilaterally ?CHEST:  Well healed pacemaker pocket.   ?HEART:  PMI not displaced or sustained,S1 and S2 within normal limits, no S3, no clicks, no rubs, no murmurs, irregular ?ABD:  Flat, positive bowel sounds normal in frequency in pitch, no bruits, no rebound, no guarding, no midline pulsatile mass, no hepatomegaly, no splenomegaly ?EXT:  2 plus pulses throughout, no edema, no cyanosis no clubbing ? ? ?EKG:  EKG is *** ordered today. ?*** ? ? ?Recent Labs: ?10/25/2021: BUN 14; Creatinine, Ser 1.00; Potassium 4.1; Sodium 146 ?12/30/2021: Hemoglobin 11.6; Platelets 248 ?01/08/2022: TSH 0.519  ? ? ?Lipid Panel ?   ?Component Value Date/Time  ? CHOL 185 01/08/2022 1510  ? TRIG 61 01/08/2022 1510  ? HDL 81 01/08/2022 1510  ? CHOLHDL 2.3 01/08/2022 1510  ? [place] 92 01/08/2022 1510  ? ?  ? ?Wt Readings from Last 3 Encounters:  ?01/08/22 230 lb 9.6 oz (104.6 kg)  ?12/30/21 232 lb (105.2 kg)  ?10/11/21 239 lb (108.4 kg)  ?  ? ? ?Other studies Reviewed: ?Additional studies/ records that were reviewed today include: *** ?Review of the above records demonstrates:   See above ? ? ?ASSESSMENT AND PLAN: ? ?ATRIAL FIB/FLUTTER:   ALA KRATZ has a CHA2DS2 - VASc score of 6 .  *** .  Tolerates anticoagulation and has had no symptoms related to the paroxysmal atrial fibrillation.  No change in therapy. ? ?HTN:  The blood pressure is *** elevated today but she says it is in the systolic 347 range typically.  She is going to keep a blood pressure diary.  Otherwise no change in therapy.  ? ?DM:  A1C is *** 7.1 down from 7.5.  Continue current therapy. ? ?BRADYCARDIA:   ***   She is now status post pacemaker with results as above.   ? ?PULMONARY HTN:   ***  She has some moderate pulmonary hypertension but she has no symptoms related to this.  We are managing this conservatively with blood pressure control, weight loss, rate management and I will reassess with an  echo in the future. ? ? ?Current medicines are reviewed at length with the patient today.  The patient does not have concerns regarding medicines. ? ?The following changes have been made: *** ? ?Labs/ tests ordered today include:     *** ? ?No orders of the defined types were placed in this encounter. ? ? ? ?Disposition:   FU with me *** ? ?Signed, ?Minus Breeding, MD  ?02/15/2022 9:53 PM    ?[place] ?

## 2022-02-16 ENCOUNTER — Ambulatory Visit: Payer: Medicare Other | Admitting: Cardiology

## 2022-02-16 DIAGNOSIS — I272 Pulmonary hypertension, unspecified: Secondary | ICD-10-CM

## 2022-02-16 DIAGNOSIS — I484 Atypical atrial flutter: Secondary | ICD-10-CM

## 2022-02-16 DIAGNOSIS — I10 Essential (primary) hypertension: Secondary | ICD-10-CM

## 2022-02-16 DIAGNOSIS — E118 Type 2 diabetes mellitus with unspecified complications: Secondary | ICD-10-CM

## 2022-02-16 DIAGNOSIS — R001 Bradycardia, unspecified: Secondary | ICD-10-CM

## 2022-02-20 ENCOUNTER — Encounter: Payer: Self-pay | Admitting: Cardiology

## 2022-02-21 NOTE — Progress Notes (Signed)
?Cardiology Office Note:   ? ?Date:  02/22/2022  ? ?ID:  Darlene Boyer, DOB 1941-10-30, MRN [id number] ? ?PCP:  Carlena Hurl, PA-C ?[place] Cardiologist: Minus Breeding, MD  ? ?Reason for visit: 1 year follow-up ? ?History of Present Illness:   ? ?Darlene Boyer is a 81 y.o. female with a hx of atrial fibrillation, pacemaker placed 2020, tricuspid regurgitation, moderate pulmonary hypertension. ? ?She was last saw Dr. Percival Spanish in March 2022.  She denied palpitations.  No new chest pain or shortness of breath.  BP was elevated and recommended to keep a blood pressure diary. ? ?Today, she is doing well.  Blood pressure now controlled.  She states she did feel a couple extra beats with teeth removal in January 2023.  Otherwise she did typically does not feel palpitations.  No bleeding issues with Xarelto.  She takes Lasix 20 mg daily.  Denies lower extremity edema, PND and orthopnea.  She has mild shortness of breath with going up a flight of steps.  She is able to do her typical chores (lives alone in [place]) and walk the grocery store without needing to stop.  Denies chest pain. ? ? ?Past Medical History:  ?Diagnosis Date  ? Arthritis   ? Atrial fibrillation ([place])   ? Diabetes mellitus without complication ([place])   ? Hypertension   ? Obesity   ? Thyroid disease   ? ? ?Past Surgical History:  ?Procedure Laterality Date  ? ABDOMINAL HYSTERECTOMY    ? partial  ? ANKLE FRACTURE SURGERY Right   ? COLONOSCOPY  01/27/2020  ? EYE SURGERY    ? Lens implants  ? FOOT SURGERY Left   ? Pins in 3 toes  ? GALLBLADDER SURGERY    ? PACEMAKER IMPLANT N/A 11/02/2019  ? Procedure: PACEMAKER IMPLANT;  Surgeon: Deboraha Sprang, MD;  Location: [facility];  Service: Cardiovascular;  Laterality: N/A;  ? TUBAL LIGATION    ? UPPER GASTROINTESTINAL ENDOSCOPY  01/27/2020  ? ? ?Current Medications: ?Current Meds  ?Medication Sig  ? diltiazem (CARDIZEM CD) 120 MG 24 hr capsule TAKE 1 CAPSULE BY MOUTH EVERY DAY  ?  furosemide (LASIX) 20 MG tablet Take 1 tablet (20 mg total) by mouth daily.  ? levothyroxine (SYNTHROID) 88 MCG tablet Take 1 tablet (88 mcg total) by mouth daily.  ? loratadine (CLARITIN) 10 MG tablet Take 10 mg by mouth every other day.  ? losartan (COZAAR) 50 MG tablet Take 1 tablet (50 mg total) by mouth daily.  ? metFORMIN (GLUCOPHAGE) 1000 MG tablet Take 1 tablet (1,000 mg total) by mouth daily.  ? metoprolol succinate (TOPROL-XL) 100 MG 24 hr tablet Take 1 tablet (100 mg total) by mouth daily.  ? montelukast (SINGULAIR) 10 MG tablet Take 10 mg by mouth daily.  ? rosuvastatin (CRESTOR) 10 MG tablet Take 1 tablet (10 mg total) by mouth daily.  ? tiZANidine (ZANAFLEX) 4 MG tablet Take 1 tablet (4 mg total) by mouth at bedtime.  ? [DISCONTINUED] XARELTO 20 MG TABS tablet TAKE 1 TABLET BY MOUTH DAILY WITH SUPPER  ?  ? ?Allergies:   Patient has no known allergies.  ? ?Social History  ? ?Socioeconomic History  ? Marital status: Widowed  ?  Spouse name: Not on file  ? Number of children: 5  ? Years of education: Not on file  ? Highest education level: Not on file  ?Occupational History  ? Occupation: retired  ?Tobacco Use  ? Smoking  status: Never  ? Smokeless tobacco: Current  ?  Types: Snuff  ? Tobacco comments:  ?  "every now and then"  ?Vaping Use  ? Vaping Use: Never used  ?Substance and Sexual Activity  ? Alcohol use: No  ?  Alcohol/week: 0.0 standard drinks  ? Drug use: No  ? Sexual activity: Not on file  ?Other Topics Concern  ? Not on file  ?Social History Narrative  ? Lives at home with husband.   ? ?Social Determinants of Health  ? ?Financial Resource Strain: Not on file  ?Food Insecurity: Not on file  ?Transportation Needs: Not on file  ?Physical Activity: Not on file  ?Stress: Not on file  ?Social Connections: Not on file  ?  ? ?Family History: ?The patient's family history includes Cancer in her mother; Emphysema in her father. There is no history of Colon cancer, Esophageal cancer, Rectal cancer, or  Stomach cancer. ? ?ROS:   ?Please see the history of present illness.    ? ?EKGs/Labs/Other Studies Reviewed:   ? ?Recent Labs: ?10/25/2021: BUN 14; Creatinine, Ser 1.00; Potassium 4.1; Sodium 146 ?12/30/2021: Hemoglobin 11.6; Platelets 248 ?01/08/2022: TSH 0.519  ? ?Recent Lipid Panel ?Lab Results  ?Component Value Date/Time  ? CHOL 185 01/08/2022 03:10 PM  ? TRIG 61 01/08/2022 03:10 PM  ? HDL 81 01/08/2022 03:10 PM  ? [place] 92 01/08/2022 03:10 PM  ? ? ?Physical Exam:   ? ?VS:  BP 120/70 (BP Location: Left Arm)   Pulse 80   Ht '5\' 4"'$  (1.626 m)   Wt 237 lb (107.5 kg)   SpO2 98%   BMI 40.68 kg/m?    ?No data found. ? ?Wt Readings from Last 3 Encounters:  ?02/22/22 237 lb (107.5 kg)  ?01/08/22 230 lb 9.6 oz (104.6 kg)  ?12/30/21 232 lb (105.2 kg)  ?  ? ?GEN:  Well nourished, well developed in no acute distress ?HEENT: Normal ?NECK: No JVD; No carotid bruits ?CARDIAC: RRR, no murmurs, rubs, gallops ?RESPIRATORY:  Clear to auscultation without rales, wheezing or rhonchi  ?ABDOMEN: Soft, non-tender, non-distended ?MUSCULOSKELETAL: No edema; No deformity  ?SKIN: Warm and dry ?NEUROLOGIC:  Alert and oriented ?PSYCHIATRIC:  Normal affect  ? ?  ?ASSESSMENT AND PLAN  ? ?Longstanding persistent atrial fibrillation ?Tachybradycardia syndrome with pacemaker ?-MDT dual chamber PPM, implanted 11/02/2019; Follows with EP ?-Continue Xarelto for stroke prevention. ?-Continue rate control.  Patient has been overall asymptomatic from her A-fib. ? ?Hypertension, controlled. ?-Continue current medications. ?-Normal potassium and creatinine in November 2022. ? ?Hyperlipidemia with goal LDL less than 100 ?-LDL 92 in February 2023.  Continue Crestor. ? ?Diabetes mellitus ?-Hemoglobin A1c 7.3% in February 2023.  On metformin. ?-I showed her how to use a glucometer (she asked me and brought it in today).   ?-She wants to get her hemoglobin A1c around 6% -I suggest a goal of 6.5 to 7% given her age. ? ?Moderate pulmonary hypertension ?-Echo  01/2021: PA pressure 55 mmHg compared to 73 mmHg in September 2021; EF 60 to 65%, normal RV size/function, moderate TR ?-Continue Lasix 20 mg daily; euvolemic on exam ?-No significant shortness of breath/change.   ? ?Disposition - Follow-up in 1 year Dr. Percival Spanish. ? ? ? ?Medication Adjustments/Labs and Tests Ordered: ?Current medicines are reviewed at length with the patient today.  Concerns regarding medicines are outlined above.  ?No orders of the defined types were placed in this encounter. ? ?Meds ordered this encounter  ?Medications  ? rivaroxaban (XARELTO) 20 MG  TABS tablet  ?  Sig: Take 1 tablet (20 mg total) by mouth daily with supper.  ?  Dispense:  90 tablet  ?  Refill:  1  ? ? ?Patient Instructions  ?Medication Instructions:  ?No Changes ?*If you need a refill on your cardiac medications before your next appointment, please call your pharmacy* ? ? ?Lab Work: ?None ?If you have labs (blood work) drawn today and your tests are completely normal, you will receive your results only by: ?MyChart Message (if you have MyChart) OR ?A paper copy in the mail ?If you have any lab test that is abnormal or we need to change your treatment, we will call you to review the results. ? ? ?Testing/Procedures: ?None ? ? ?Follow-Up: ?At [facility], you and your health needs are our priority.  As part of our continuing mission to provide you with exceptional heart care, we have created designated Provider Care Teams.  These Care Teams include your primary Cardiologist (physician) and Advanced Practice Providers (APPs -  Physician Assistants and Nurse Practitioners) who all work together to provide you with the care you need, when you need it. ? ?We recommend signing up for the patient portal called "MyChart".  Sign up information is provided on this After Visit Summary.  MyChart is used to connect with patients for Virtual Visits (Telemedicine).  Patients are able to view lab/test results, encounter notes, upcoming  appointments, etc.  Non-urgent messages can be sent to your provider as well.   ?To learn more about what you can do with MyChart, go to [email or web address].   ? ?Your next appointment:   ?1 year(s) ? ?The format f

## 2022-02-22 ENCOUNTER — Encounter: Payer: Self-pay | Admitting: Physician Assistant

## 2022-02-22 ENCOUNTER — Ambulatory Visit: Payer: Medicare Other | Admitting: Physician Assistant

## 2022-02-22 VITALS — BP 120/70 | HR 80 | Ht 64.0 in | Wt 237.0 lb

## 2022-02-22 DIAGNOSIS — I272 Pulmonary hypertension, unspecified: Secondary | ICD-10-CM

## 2022-02-22 DIAGNOSIS — I4811 Longstanding persistent atrial fibrillation: Secondary | ICD-10-CM | POA: Diagnosis not present

## 2022-02-22 DIAGNOSIS — E118 Type 2 diabetes mellitus with unspecified complications: Secondary | ICD-10-CM | POA: Diagnosis not present

## 2022-02-22 DIAGNOSIS — I10 Essential (primary) hypertension: Secondary | ICD-10-CM | POA: Diagnosis not present

## 2022-02-22 MED ORDER — RIVAROXABAN 20 MG PO TABS: 20.0000 mg | ORAL_TABLET | Freq: Every day | ORAL | 1 refills | Status: DC

## 2022-02-22 NOTE — Patient Instructions (Signed)
Medication Instructions:  ?No Changes ?*If you need a refill on your cardiac medications before your next appointment, please call your pharmacy* ? ? ?Lab Work: ?None ?If you have labs (blood work) drawn today and your tests are completely normal, you will receive your results only by: ?MyChart Message (if you have MyChart) OR ?A paper copy in the mail ?If you have any lab test that is abnormal or we need to change your treatment, we will call you to review the results. ? ? ?Testing/Procedures: ?None ? ? ?Follow-Up: ?At [facility], you and your health needs are our priority.  As part of our continuing mission to provide you with exceptional heart care, we have created designated Provider Care Teams.  These Care Teams include your primary Cardiologist (physician) and Advanced Practice Providers (APPs -  Physician Assistants and Nurse Practitioners) who all work together to provide you with the care you need, when you need it. ? ?We recommend signing up for the patient portal called "MyChart".  Sign up information is provided on this After Visit Summary.  MyChart is used to connect with patients for Virtual Visits (Telemedicine).  Patients are able to view lab/test results, encounter notes, upcoming appointments, etc.  Non-urgent messages can be sent to your provider as well.   ?To learn more about what you can do with MyChart, go to [email or web address].   ? ?Your next appointment:   ?1 year(s) ? ?The format for your next appointment:   ?In Person ? ?Provider:   ?Minus Breeding, MD   ? ? ?  ?

## 2022-03-21 ENCOUNTER — Other Ambulatory Visit: Payer: Self-pay | Admitting: Medical

## 2022-04-07 ENCOUNTER — Other Ambulatory Visit: Payer: Self-pay | Admitting: Medical

## 2022-04-10 ENCOUNTER — Encounter: Payer: Medicare Other | Admitting: Internal Medicine

## 2022-05-02 ENCOUNTER — Ambulatory Visit (INDEPENDENT_AMBULATORY_CARE_PROVIDER_SITE_OTHER): Payer: Medicare Other

## 2022-05-02 DIAGNOSIS — I440 Atrioventricular block, first degree: Secondary | ICD-10-CM | POA: Diagnosis not present

## 2022-05-07 ENCOUNTER — Encounter: Payer: Self-pay | Admitting: Internal Medicine

## 2022-05-07 ENCOUNTER — Ambulatory Visit (INDEPENDENT_AMBULATORY_CARE_PROVIDER_SITE_OTHER): Payer: Medicare Other | Admitting: Internal Medicine

## 2022-05-07 VITALS — BP 122/70 | Ht 64.0 in | Wt 231.2 lb

## 2022-05-07 DIAGNOSIS — Z950 Presence of cardiac pacemaker: Secondary | ICD-10-CM | POA: Diagnosis not present

## 2022-05-07 DIAGNOSIS — I4819 Other persistent atrial fibrillation: Secondary | ICD-10-CM | POA: Diagnosis not present

## 2022-05-07 LAB — CUP PACEART REMOTE DEVICE CHECK
Battery Remaining Longevity: 138 mo
Battery Voltage: 3.02 V
Brady Statistic AP VP Percent: 3.58 %
Brady Statistic AP VS Percent: 7.55 %
Brady Statistic AS VP Percent: 36.6 %
Brady Statistic AS VS Percent: 52.06 %
Brady Statistic RA Percent Paced: 0.76 %
Brady Statistic RV Percent Paced: 34.56 %
Date Time Interrogation Session: 20230610192326
Implantable Lead Implant Date: 20201207
Implantable Lead Location: 753859
Implantable Lead Location: 753860
Implantable Lead Model: 4076
Implantable Lead Model: 4076
Implantable Pulse Generator Implant Date: 20201207
Lead Channel Impedance Value: 342 Ohm
Lead Channel Impedance Value: 399 Ohm
Lead Channel Impedance Value: 475 Ohm
Lead Channel Impedance Value: 513 Ohm
Lead Channel Pacing Threshold Amplitude: 0.5 V
Lead Channel Pacing Threshold Amplitude: 0.5 V
Lead Channel Pacing Threshold Pulse Width: 0.4 ms
Lead Channel Pacing Threshold Pulse Width: 0.4 ms
Lead Channel Sensing Intrinsic Amplitude: 1 mV
Lead Channel Sensing Intrinsic Amplitude: 1 mV
Lead Channel Sensing Intrinsic Amplitude: 17.625 mV
Lead Channel Sensing Intrinsic Amplitude: 17.625 mV
Lead Channel Setting Pacing Amplitude: 1.5 V
Lead Channel Setting Pacing Amplitude: 2.5 V
Lead Channel Setting Pacing Pulse Width: 0.4 ms
Lead Channel Setting Sensing Sensitivity: 0.9 mV

## 2022-05-07 NOTE — Patient Instructions (Signed)
Medication Instructions:  Your physician recommends that you continue on your current medications as directed. Please refer to the Current Medication list given to you today.  *If you need a refill on your cardiac medications before your next appointment, please call your pharmacy*   Lab Work: None ordered.  If you have labs (blood work) drawn today and your tests are completely normal, you will receive your results only by: [place] (if you have MyChart) OR A paper copy in the mail If you have any lab test that is abnormal or we need to change your treatment, we will call you to review the results.   Testing/Procedures: None ordered.    Follow-Up: At [facility], you and your health needs are our priority.  As part of our continuing mission to provide you with exceptional heart care, we have created designated Provider Care Teams.  These Care Teams include your primary Cardiologist (physician) and Advanced Practice Providers (APPs -  Physician Assistants and Nurse Practitioners) who all work together to provide you with the care you need, when you need it.  We recommend signing up for the patient portal called "MyChart".  Sign up information is provided on this After Visit Summary.  MyChart is used to connect with patients for Virtual Visits (Telemedicine).  Patients are able to view lab/test results, encounter notes, upcoming appointments, etc.  Non-urgent messages can be sent to your provider as well.   To learn more about what you can do with MyChart, go to [email or web address].    Your next appointment:   6 months with Dr Percival Spanish  12 months with Dr Olin Pia, PA  Important Information About Sugar

## 2022-05-07 NOTE — Progress Notes (Signed)
Patient Care Team: Tysinger, Camelia Eng, PA-C as PCP - General (Family Medicine) Minus Breeding, MD as PCP - Cardiology (Cardiology) Deboraha Sprang, MD as PCP - Electrophysiology (Cardiology)   HPI  Darlene Boyer is a 81 y.o. 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  Persistent atrial fibrillation.  Overall she feels great.  No complaints of dyspnea or palpitations.  No edema or chest pain.  Tolerating her anticoagulation without bleeding.  Date Cr K Hgb  11/22 1.0 4.1 11.4          DATE TEST EF   9/21 Echo   60-65 % PA 72  3/22 Echo   60-65% % PA 55 mm LAE 49 ml/m2                Thromboembolic risk factors ( age  -2, HTN-1, DM-1, Vasc disease -1, Gender-1) for a CHADSVASc Score of >=6   Past Medical History:  Diagnosis Date   Arthritis    Atrial fibrillation ([place])    Diabetes mellitus without complication ([place])    Hypertension    Obesity    Thyroid disease     Past Surgical History:  Procedure Laterality Date   ABDOMINAL HYSTERECTOMY     partial   ANKLE FRACTURE SURGERY Right    COLONOSCOPY  01/27/2020   EYE SURGERY     Lens implants   FOOT SURGERY Left    Pins in 3 toes   GALLBLADDER SURGERY     PACEMAKER IMPLANT N/A 11/02/2019   Procedure: PACEMAKER IMPLANT;  Surgeon: Deboraha Sprang, MD;  Location: [facility];  Service: Cardiovascular;  Laterality: N/A;   TUBAL LIGATION     UPPER GASTROINTESTINAL ENDOSCOPY  01/27/2020    Current Meds  Medication Sig   diltiazem (CARDIZEM CD) 120 MG 24 hr capsule TAKE 1 CAPSULE BY MOUTH EVERY DAY   furosemide (LASIX) 20 MG tablet Take 1 tablet (20 mg total) by mouth daily.   levothyroxine (SYNTHROID) 88 MCG tablet TAKE 1 TABLET BY MOUTH EVERY DAY   loratadine (CLARITIN) 10 MG tablet Take 10 mg by mouth every other day.   losartan (COZAAR) 50 MG tablet TAKE 1 TABLET BY MOUTH EVERY DAY   metFORMIN (GLUCOPHAGE) 1000 MG tablet Take 1 tablet (1,000 mg total)  by mouth daily.   metoprolol succinate (TOPROL-XL) 100 MG 24 hr tablet Take 1 tablet (100 mg total) by mouth daily.   montelukast (SINGULAIR) 10 MG tablet Take 10 mg by mouth daily.   rivaroxaban (XARELTO) 20 MG TABS tablet Take 1 tablet (20 mg total) by mouth daily with supper.   rosuvastatin (CRESTOR) 10 MG tablet Take 1 tablet (10 mg total) by mouth daily.    No Known Allergies    Review of Systems negative except from HPI and PMH  Physical Exam BP 122/70   Ht '5\' 4"'$  (1.626 m)   Wt 231 lb 3.2 oz (104.9 kg)   SpO2 95%   BMI 39.69 kg/m   Well developed and well nourished in no acute distress HENT normal Neck supple with JVP 7-8 Clear Device pocket well healed; without hematoma or erythema.  There is no tethering  Irregularly irregular rate and rhythm, no  gallop No murmur Abd-soft with active BS No Clubbing cyanosis  edema Skin-warm and dry A & Oriented  Grossly normal sensory and motor function  ECG atrial fibrillation with intermittent ventricular pacing  CrCl cannot be calculated (Patient's most recent lab result is  older than the maximum 21 days allowed.).   Assessment and  Plan  Atrial fibrillation-persistent-long-term>> permanent  Bradycardia  Pacemaker-Medtronic  PVCs  Pulmonary hypertension  Palpitations  Sleep disordered breathing  Without complaints.  She is now in atrial fibrillation for about a year in the context of severe left atrial enlargement and inability to hold sinus rhythm.  Given the paucity of symptoms, we will not make an effort to restore sinus rhythm.  We will designate it permanent atrial fibrillation  Heart rate excursion is reasonable.  It is actually better than it was in the preceding 6 months.  We will continue her metoprolol and her diltiazem.  Tolerating Xarelto without bleeding.  Blood pressure is well controlled on metoprolol diltiazem combination in addition to the Cozaar 50  Have her follow-up with Dr. Lenore Cordia in the fall.   He may want to repeat an echo to reassess her pulmonary hypertension.  Given the paucity of symptoms however, I think she is doing really well

## 2022-05-11 ENCOUNTER — Other Ambulatory Visit: Payer: Self-pay | Admitting: Medical

## 2022-05-11 NOTE — Progress Notes (Signed)
Remote pacemaker transmission.   

## 2022-06-12 ENCOUNTER — Ambulatory Visit (INDEPENDENT_AMBULATORY_CARE_PROVIDER_SITE_OTHER): Payer: Medicare Other | Admitting: Medical

## 2022-06-12 VITALS — BP 124/70 | HR 60 | Wt 232.0 lb

## 2022-06-12 DIAGNOSIS — E118 Type 2 diabetes mellitus with unspecified complications: Secondary | ICD-10-CM

## 2022-06-12 DIAGNOSIS — I10 Essential (primary) hypertension: Secondary | ICD-10-CM

## 2022-06-12 DIAGNOSIS — R0989 Other specified symptoms and signs involving the circulatory and respiratory systems: Secondary | ICD-10-CM | POA: Insufficient documentation

## 2022-06-12 DIAGNOSIS — Q845 Enlarged and hypertrophic nails: Secondary | ICD-10-CM

## 2022-06-12 DIAGNOSIS — I484 Atypical atrial flutter: Secondary | ICD-10-CM

## 2022-06-12 DIAGNOSIS — Z7185 Encounter for immunization safety counseling: Secondary | ICD-10-CM | POA: Diagnosis not present

## 2022-06-12 DIAGNOSIS — E079 Disorder of thyroid, unspecified: Secondary | ICD-10-CM | POA: Diagnosis not present

## 2022-06-12 DIAGNOSIS — E039 Hypothyroidism, unspecified: Secondary | ICD-10-CM

## 2022-06-12 NOTE — Progress Notes (Signed)
Subjective:  Darlene Boyer is a 82 y.o. female who presents for Chief Complaint  Patient presents with   follow-up on synthroid    Follow-up on synthroid. Would like her metformin refilled as well, no other concerns     Here for med check.  Hypothyroidism-last visit in February we decreased from 100 mcg on 88 mcg daily given lab findings.  She is compliant with medication without complaint.  Diabetes-not checking blood sugars regularly.  She is taking metformin twice daily which she takes at thousand in the morning 500 in the evening.  She was on Glucophage years ago  No foot   In February also added statin.  She is compliant with medication without complaint.  She has not  Has some allergies, clearing throat some.  Taking Claritin and Singulair but does not think the Claritin is helping.  No other aggravating or relieving factors.    No other c/o.  The following portions of the patient's history were reviewed and updated as appropriate: allergies, current medications, past family history, past medical history, past social history, past surgical history and problem list.  ROS Otherwise as in subjective above    Objective: BP 124/70   Pulse 60   Wt 232 lb (105.2 kg)   BMI 39.82 kg/m   General appearance: alert, no distress, well developed, well nourished Neck: supple, no lymphadenopathy, no thyromegaly, no masses Heart: RRR, normal S1, S2, no murmurs Lungs: CTA bilaterally, no wheezes, rhonchi, or rales Pulses: 2+ radial pulses, 1+ pedal pulses, normal cap refill Ext: no edema  Diabetic Foot Exam - Simple   Simple Foot Form Diabetic Foot exam was performed with the following findings: Yes 06/12/2022  2:51 PM  Visual Inspection See comments: Yes Sensation Testing Intact to touch and monofilament testing bilaterally: Yes Pulse Check See comments: Yes Comments 1+ pedal pulses, thickened toenails bilat throughout      Assessment: Encounter Diagnoses  Name  Primary?   Type 2 diabetes mellitus with complication, without long-term current use of insulin (HCC) Yes   Thyroid disease    Hypothyroidism, unspecified type    Primary hypertension    Atypical atrial flutter (HCC)    Enlarged and hypertrophic nails    Decreased pedal pulses    Vaccine counseling      Plan: Diabetes-updated labs today, continue metformin twice daily, 1000 mg morning, 500 mg daily.  Continue daily foot checks.  Decreased pulses-we will refer for ABIs  Hypertrophic nails-referral to podiatry  Hypothyroidism-updated labs, continue 88 mcg thyroid medicine daily  Hypertension, A-fib-continue current therapy, sees cardiology  Vaccines UTD  Darlene Boyer was seen today for follow-up on synthroid.  Diagnoses and all orders for this visit:  Type 2 diabetes mellitus with complication, without long-term current use of insulin (HCC) -     Hemoglobin A1c -     Comprehensive metabolic panel -     VAS [place] [name] WITH/WO TBI; Future -     Ambulatory referral to Podiatry  Thyroid disease -     TSH + free T4  Hypothyroidism, unspecified type -     TSH + free T4  Primary hypertension  Atypical atrial flutter (HCC)  Enlarged and hypertrophic nails -     Ambulatory referral to Podiatry  Decreased pedal pulses -     VAS [place] [name] WITH/WO TBI; Future  Vaccine counseling    Follow up: pending labs, referral, [name]

## 2022-06-13 ENCOUNTER — Other Ambulatory Visit: Payer: Self-pay | Admitting: Medical

## 2022-06-13 LAB — COMPREHENSIVE METABOLIC PANEL
ALT: 11 IU/L (ref 0–32)
AST: 21 IU/L (ref 0–40)
Albumin/Globulin Ratio: 1.8 (ref 1.2–2.2)
Albumin: 4.4 g/dL (ref 3.8–4.8)
Alkaline Phosphatase: 77 IU/L (ref 44–121)
BUN/Creatinine Ratio: 14 (ref 12–28)
BUN: 14 mg/dL (ref 8–27)
Bilirubin Total: 0.4 mg/dL (ref 0.0–1.2)
CO2: 23 mmol/L (ref 20–29)
Calcium: 9 mg/dL (ref 8.7–10.3)
Chloride: 100 mmol/L (ref 96–106)
Creatinine, Ser: 0.99 mg/dL (ref 0.57–1.00)
Globulin, Total: 2.5 g/dL (ref 1.5–4.5)
Glucose: 271 mg/dL — ABNORMAL HIGH (ref 70–99)
Potassium: 3.6 mmol/L (ref 3.5–5.2)
Sodium: 138 mmol/L (ref 134–144)
Total Protein: 6.9 g/dL (ref 6.0–8.5)
eGFR: 58 mL/min/{1.73_m2} — ABNORMAL LOW (ref >59)

## 2022-06-13 LAB — HEMOGLOBIN A1C
Est. average glucose Bld gHb Est-mCnc: 177 mg/dL
Hgb A1c MFr Bld: 7.8 % — ABNORMAL HIGH (ref 4.8–5.6)

## 2022-06-13 LAB — TSH+FREE T4
Free T4: 1.53 ng/dL (ref 0.82–1.77)
TSH: 1.7 u[IU]/mL (ref 0.450–4.500)

## 2022-06-13 MED ORDER — RYBELSUS 3 MG PO TABS: 1.0000 | ORAL_TABLET | Freq: Every day | ORAL | 0 refills | Status: DC

## 2022-06-13 MED ORDER — RYBELSUS 7 MG PO TABS: 1.0000 | ORAL_TABLET | Freq: Every day | ORAL | 0 refills | Status: DC

## 2022-06-15 ENCOUNTER — Telehealth: Payer: Self-pay | Admitting: Internal Medicine

## 2022-06-15 NOTE — Telephone Encounter (Signed)
Darlene Boyer with Cardiovascular called and wants to know if ABIs require PA. Call her back at [phone] and let her know

## 2022-07-05 ENCOUNTER — Ambulatory Visit (HOSPITAL_COMMUNITY)
Admission: RE | Admit: 2022-07-05 | Discharge: 2022-07-05 | Disposition: A | Discharge status: Home / Self Care | Payer: Medicare Other | Source: Ambulatory Visit | Attending: Medical | Admitting: Medical

## 2022-07-05 DIAGNOSIS — E118 Type 2 diabetes mellitus with unspecified complications: Secondary | ICD-10-CM | POA: Diagnosis not present

## 2022-07-05 DIAGNOSIS — R0989 Other specified symptoms and signs involving the circulatory and respiratory systems: Secondary | ICD-10-CM | POA: Diagnosis not present

## 2022-07-05 NOTE — Progress Notes (Signed)
[name] has been completed.   Preliminary results in CV Proc.   Darlene Boyer 07/05/2022 1:35 PM

## 2022-07-10 ENCOUNTER — Ambulatory Visit: Payer: Medicare Other | Admitting: Podiatry

## 2022-07-10 ENCOUNTER — Ambulatory Visit (INDEPENDENT_AMBULATORY_CARE_PROVIDER_SITE_OTHER): Payer: Medicare Other

## 2022-07-10 DIAGNOSIS — Z7901 Long term (current) use of anticoagulants: Secondary | ICD-10-CM | POA: Diagnosis not present

## 2022-07-10 DIAGNOSIS — B351 Tinea unguium: Secondary | ICD-10-CM

## 2022-07-10 DIAGNOSIS — M79671 Pain in right foot: Secondary | ICD-10-CM

## 2022-07-10 DIAGNOSIS — M19071 Primary osteoarthritis, right ankle and foot: Secondary | ICD-10-CM

## 2022-07-10 DIAGNOSIS — Z8781 Personal history of (healed) traumatic fracture: Secondary | ICD-10-CM | POA: Diagnosis not present

## 2022-07-10 NOTE — Patient Instructions (Signed)
I have ordered a medication for you that will come from [facility] in [place]. They should be calling you to verify insurance and will mail the medication to you. If you live close by then you can go by their pharmacy to pick up the medication. Their phone number is [phone]. If you do not hear from them in the next few days, please give us a call at [phone].   

## 2022-07-17 NOTE — Progress Notes (Signed)
Subjective:   Patient ID: Darlene Boyer, female   DOB: 81 y.o.   MRN: [id number]   HPI Chief Complaint  Patient presents with   Nail Problem    A1C-7.3 BG- PT doesn't take, pt came in today for diabetic foot care,nail fungus,  pt is also having some right ankle pain, pt surg in 2011 on the ankle, rate of pain is a 8 out of 10, X-rays done today     81 year old female presents the office today with above complaints.  No recent injuries otherwise.  She has no other concerns.  She is on Xarelto.   Review of Systems  All other systems reviewed and are negative.  Past Medical History:  Diagnosis Date   Arthritis    Atrial fibrillation ([place])    Diabetes mellitus without complication ([place])    Hypertension    Obesity    Thyroid disease     Past Surgical History:  Procedure Laterality Date   ABDOMINAL HYSTERECTOMY     partial   ANKLE FRACTURE SURGERY Right    COLONOSCOPY  01/27/2020   EYE SURGERY     Lens implants   FOOT SURGERY Left    Pins in 3 toes   GALLBLADDER SURGERY     PACEMAKER IMPLANT N/A 11/02/2019   Procedure: PACEMAKER IMPLANT;  Surgeon: Deboraha Sprang, MD;  Location: [facility];  Service: Cardiovascular;  Laterality: N/A;   TUBAL LIGATION     UPPER GASTROINTESTINAL ENDOSCOPY  01/27/2020     Current Outpatient Medications:    diltiazem (CARDIZEM CD) 120 MG 24 hr capsule, TAKE 1 CAPSULE BY MOUTH EVERY DAY, Disp: 90 capsule, Rfl: 3   furosemide (LASIX) 20 MG tablet, Take 1 tablet (20 mg total) by mouth daily., Disp: 30 tablet, Rfl: 11   levothyroxine (SYNTHROID) 88 MCG tablet, TAKE 1 TABLET BY MOUTH EVERY DAY, Disp: 90 tablet, Rfl: 0   loratadine (CLARITIN) 10 MG tablet, Take 10 mg by mouth every other day., Disp: , Rfl:    losartan (COZAAR) 50 MG tablet, TAKE 1 TABLET BY MOUTH EVERY DAY, Disp: 90 tablet, Rfl: 0   metFORMIN (GLUCOPHAGE) 1000 MG tablet, TAKE 1 TABLET BY MOUTH EVERY DAY (Patient taking differently: '1000mg'$  am, '500mg'$  pm), Disp: 90 tablet,  Rfl: 0   metoprolol succinate (TOPROL-XL) 100 MG 24 hr tablet, TAKE 1 TABLET BY MOUTH EVERY DAY, Disp: 90 tablet, Rfl: 0   montelukast (SINGULAIR) 10 MG tablet, Take 10 mg by mouth daily., Disp: , Rfl:    rivaroxaban (XARELTO) 20 MG TABS tablet, Take 1 tablet (20 mg total) by mouth daily with supper., Disp: 90 tablet, Rfl: 1   rosuvastatin (CRESTOR) 10 MG tablet, Take 1 tablet (10 mg total) by mouth daily., Disp: 90 tablet, Rfl: 3   Semaglutide (RYBELSUS) 3 MG TABS, Take 1 tablet by mouth daily., Disp: 30 tablet, Rfl: 0  No Known Allergies      Objective:  Physical Exam  General: AAO x3, NAD  Dermatological: Nails are hypertrophic, dystrophic, brittle, discolored, elongated 10. No surrounding redness or drainage.  No open lesions or pre-ulcerative lesions are identified today.  Vascular: Dorsalis Pedis artery and Posterior Tibial artery pedal pulses are 2/4 bilateral with immedate capillary fill time. There is no pain with calf compression, swelling, warmth, erythema.   Neruologic: Grossly intact via light touch bilateral.   Musculoskeletal: Tenderness noted along the right ankle joint there is decreased range of motion of the ankle joint.  There is mild edema  there is no erythema or warmth.  Muscular strength 5/5 in all groups tested bilateral.  Decreased medial arch height.  Gait: Unassisted, Nonantalgic.       Assessment:   Onychomycosis, currently on Xarelto; ankle joint arthritis     Plan:  -Treatment options discussed including all alternatives, risks, and complications -Etiology of symptoms were discussed -X-rays were obtained and reviewed with the patient.  2 views of the right ankle and 3 views of the foot were obtained.  Hardware intact from previous ankle fracture.  Arthritic changes present to the ankle joint.  Decreased calcaneal inflammation ankle.  Calcaneal spurring is present midfoot arthritis is also noted along the lateral view. -Sharply debrided the nails x10  without any complications or bleeding.  Also ordered a compound cream through [facility] to help with onychomycosis. -Discussed ankle joint arthritis as well as pain.  Discussed conservative as well as surgical options.  Discussed about bracing options and consider injections.  Would hold off on anti-inflammatories given her blood thinners.  Topical anti-inflammatories such as Voltaren gel can be used.  Icing daily.  Trula Slade DPM

## 2022-07-20 ENCOUNTER — Telehealth: Payer: Self-pay | Admitting: Medical

## 2022-07-20 NOTE — Telephone Encounter (Signed)
Left message for patient to call back and schedule Medicare Annual Wellness Visit (AWV) either virtually or in office. I left my number for patient to call [phone].  awvi 11/26/09 per [facility]  please schedule at anytime with health coach  This should be a 45 minute visit.

## 2022-07-24 ENCOUNTER — Other Ambulatory Visit: Payer: Self-pay | Admitting: Podiatry

## 2022-07-24 DIAGNOSIS — M19071 Primary osteoarthritis, right ankle and foot: Secondary | ICD-10-CM

## 2022-07-31 ENCOUNTER — Telehealth: Payer: Self-pay | Admitting: Medical

## 2022-07-31 NOTE — Telephone Encounter (Signed)
Left message for patient to call back and schedule Medicare Annual Wellness Visit (AWV) either virtually or in office.     awvi 11/26/09 per [facility] ; please schedule at anytime with health coach  This should be a 45 minute visit.

## 2022-08-01 ENCOUNTER — Encounter: Payer: Self-pay | Admitting: Internal Medicine

## 2022-08-01 ENCOUNTER — Ambulatory Visit (INDEPENDENT_AMBULATORY_CARE_PROVIDER_SITE_OTHER): Payer: Medicare Other

## 2022-08-01 DIAGNOSIS — I440 Atrioventricular block, first degree: Secondary | ICD-10-CM

## 2022-08-01 LAB — CUP PACEART REMOTE DEVICE CHECK
Battery Remaining Longevity: 135 mo
Battery Voltage: 3.02 V
Brady Statistic AP VP Percent: 1.05 %
Brady Statistic AP VS Percent: 88.54 %
Brady Statistic AS VP Percent: 2.88 %
Brady Statistic AS VS Percent: 7.57 %
Brady Statistic RA Percent Paced: 2.79 %
Brady Statistic RV Percent Paced: 51.71 %
Date Time Interrogation Session: 20230905200812
Implantable Lead Implant Date: 20201207
Implantable Lead Location: 753859
Implantable Lead Location: 753860
Implantable Lead Model: 4076
Implantable Lead Model: 4076
Implantable Pulse Generator Implant Date: 20201207
Lead Channel Impedance Value: 323 Ohm
Lead Channel Impedance Value: 399 Ohm
Lead Channel Impedance Value: 475 Ohm
Lead Channel Impedance Value: 494 Ohm
Lead Channel Pacing Threshold Amplitude: 0.625 V
Lead Channel Pacing Threshold Amplitude: 0.625 V
Lead Channel Pacing Threshold Pulse Width: 0.4 ms
Lead Channel Pacing Threshold Pulse Width: 0.4 ms
Lead Channel Sensing Intrinsic Amplitude: 1.25 mV
Lead Channel Sensing Intrinsic Amplitude: 1.25 mV
Lead Channel Sensing Intrinsic Amplitude: 17.625 mV
Lead Channel Sensing Intrinsic Amplitude: 17.625 mV
Lead Channel Setting Pacing Amplitude: 1.5 V
Lead Channel Setting Pacing Amplitude: 2.5 V
Lead Channel Setting Pacing Pulse Width: 0.4 ms
Lead Channel Setting Sensing Sensitivity: 0.9 mV

## 2022-08-14 ENCOUNTER — Other Ambulatory Visit: Payer: Self-pay | Admitting: Medical

## 2022-08-20 NOTE — Progress Notes (Signed)
Remote pacemaker transmission.   

## 2022-09-04 ENCOUNTER — Encounter: Payer: Self-pay | Admitting: Internal Medicine

## 2022-09-10 DIAGNOSIS — L299 Pruritus, unspecified: Secondary | ICD-10-CM | POA: Insufficient documentation

## 2022-09-10 DIAGNOSIS — H903 Sensorineural hearing loss, bilateral: Secondary | ICD-10-CM | POA: Diagnosis not present

## 2022-09-20 ENCOUNTER — Other Ambulatory Visit: Payer: Self-pay | Admitting: Medical

## 2022-09-23 ENCOUNTER — Other Ambulatory Visit: Payer: Self-pay | Admitting: Medical

## 2022-10-02 DIAGNOSIS — Z23 Encounter for immunization: Secondary | ICD-10-CM | POA: Diagnosis not present

## 2022-10-06 ENCOUNTER — Other Ambulatory Visit: Payer: Self-pay | Admitting: Medical

## 2022-10-11 ENCOUNTER — Ambulatory Visit: Payer: Medicare Other | Admitting: Podiatry

## 2022-10-20 ENCOUNTER — Other Ambulatory Visit: Payer: Self-pay | Admitting: Medical

## 2022-10-25 ENCOUNTER — Telehealth: Payer: Self-pay | Admitting: Medical

## 2022-10-25 NOTE — Telephone Encounter (Signed)
Left message for patient to call back and schedule Medicare Annual Wellness Visit (AWV) either virtually or in office. Left  my Darlene Boyer number [phone]   awvi 11/26/09 per [facility]  please schedule with Nurse Health Adviser   45 min for awv-i and in office appointments 30 min for awv-s  phone/virtual appointments

## 2022-10-28 NOTE — Progress Notes (Unsigned)
Cardiology Office Note   Date:  10/30/2022   ID:  Darlene Boyer, DOB 1940-12-12, MRN [id number]  PCP:  Carlena Hurl, PA-C  Cardiologist:   Minus Breeding, MD   Chief Complaint  Patient presents with   Atrial Fibrillation      History of Present Illness: Darlene Boyer is a 81 y.o. female who presents for evaluation of atrial fibrillation.  She had a pacemaker in Dec 2020.  Since I last saw her she she is done very well.  She is in permanent atrial fibrillation per Dr. Caryl Comes.  She does not notice this. The patient denies any new symptoms such as chest discomfort, neck or arm discomfort. There has been no new shortness of breath, PND or orthopnea. There have been no reported palpitations, presyncope or syncope.    Past Medical History:  Diagnosis Date   Arthritis    Atrial fibrillation ([place])    Diabetes mellitus without complication ([place])    Hypertension    Obesity    Thyroid disease     Past Surgical History:  Procedure Laterality Date   ABDOMINAL HYSTERECTOMY     partial   ANKLE FRACTURE SURGERY Right    COLONOSCOPY  01/27/2020   EYE SURGERY     Lens implants   FOOT SURGERY Left    Pins in 3 toes   GALLBLADDER SURGERY     PACEMAKER IMPLANT N/A 11/02/2019   Procedure: PACEMAKER IMPLANT;  Surgeon: Deboraha Sprang, MD;  Location: [facility];  Service: Cardiovascular;  Laterality: N/A;   TUBAL LIGATION     UPPER GASTROINTESTINAL ENDOSCOPY  01/27/2020     Current Outpatient Medications  Medication Sig Dispense Refill   diltiazem (CARDIZEM CD) 120 MG 24 hr capsule TAKE 1 CAPSULE BY MOUTH EVERY DAY 90 capsule 3   furosemide (LASIX) 20 MG tablet Take 1 tablet (20 mg total) by mouth daily. 30 tablet 11   levothyroxine (SYNTHROID) 88 MCG tablet TAKE 1 TABLET BY MOUTH EVERY DAY 90 tablet 0   loratadine (CLARITIN) 10 MG tablet Take 10 mg by mouth every other day.     losartan (COZAAR) 50 MG tablet TAKE 1 TABLET BY MOUTH EVERY DAY 90 tablet 0    metFORMIN (GLUCOPHAGE) 1000 MG tablet TAKE ONE TABLET BY MOUTH IN THE MORNING, AND 1/2 TABLET IN THE EVENING 90 tablet 0   metoprolol succinate (TOPROL-XL) 100 MG 24 hr tablet TAKE 1 TABLET BY MOUTH EVERY DAY 90 tablet 0   rivaroxaban (XARELTO) 20 MG TABS tablet Take 1 tablet (20 mg total) by mouth daily with supper. 90 tablet 1   rosuvastatin (CRESTOR) 10 MG tablet TAKE 1 TABLET BY MOUTH EVERY DAY 90 tablet 0   No current facility-administered medications for this visit.    Allergies:   Patient has no known allergies.   ROS:  Please see the history of present illness.   Otherwise, review of systems are positive for none.   PHYSICAL EXAM: VS:  BP 110/62 (BP Location: Left Arm, Patient Position: Sitting, Cuff Size: Large)   Pulse 79   Ht '5\' 4"'$  (1.626 m)   Wt 230 lb (104.3 kg)   BMI 39.48 kg/m  , BMI Body mass index is 39.48 kg/m.  GENERAL:  Well appearing NECK:  Positive  jugular venous distention, waveform within normal limits, carotid upstroke brisk and symmetric, no bruits, no thyromegaly LUNGS:  Clear to auscultation bilaterally CHEST: Well-healed device scar HEART:  PMI  not displaced or sustained,S1 and S2 within normal limits, no S3,  no clicks, no rubs, no murmurs, irregular ABD:  Flat, positive bowel sounds normal in frequency in pitch, no bruits, no rebound, no guarding, no midline pulsatile mass, no hepatomegaly, no splenomegaly EXT:  2 plus pulses throughout, no edema, no cyanosis no clubbing  EKG:  EKG is not ordered today.    Recent Labs: 12/30/2021: Hemoglobin 11.6; Platelets 248 06/12/2022: ALT 11; BUN 14; Creatinine, Ser 0.99; Potassium 3.6; Sodium 138; TSH 1.700    Lipid Panel    Component Value Date/Time   CHOL 185 01/08/2022 1510   TRIG 61 01/08/2022 1510   HDL 81 01/08/2022 1510   CHOLHDL 2.3 01/08/2022 1510   LDLCALC 92 01/08/2022 1510      Wt Readings from Last 3 Encounters:  10/30/22 230 lb (104.3 kg)  06/12/22 232 lb (105.2 kg)  05/07/22 231 lb  3.2 oz (104.9 kg)      Other studies Reviewed: Additional studies/ records that were reviewed today include:    EP notes and labs Review of the above records demonstrates:   See above   ASSESSMENT AND PLAN:  ATRIAL FIB/FLUTTER:   [name] has a CHA2DS2 - VASc score of 6 .  She tolerates anticoagulation.  She is up-to-date with blood work.  She does have some mild anemia but no active bleeding per her report.  No change in therapy.    HTN:  The blood pressure is at target.  No change in therapy.  E  DM:  A1C is 7.8.  This is up slightly from previous.  This is managed per Carlena Hurl, PA-C  BRADYCARDIA:    She is up-to-date with device follow-up.  No change in therapy.  PULMONARY HTN:   She has no overt symptoms related to this.  I will follow-up with an echocardiogram in March as it would have been 2 years.  This was mild pulmonary hypertension managed conservatively previously.  We are managing this with blood pressure control, weight loss.   Current medicines are reviewed at length with the patient today.  The patient does not have concerns regarding medicines.  The following changes have been made: None  Labs/ tests ordered today include:    None  Orders Placed This Encounter  Procedures   ECHOCARDIOGRAM COMPLETE     Disposition:   FU with me in one year.   Signed, Minus Breeding, MD  10/30/2022 5:11 PM    [place]

## 2022-10-30 ENCOUNTER — Encounter: Payer: Self-pay | Admitting: Cardiology

## 2022-10-30 ENCOUNTER — Ambulatory Visit: Payer: Medicare Other | Attending: Cardiology | Admitting: Cardiology

## 2022-10-30 VITALS — BP 110/62 | HR 79 | Ht 64.0 in | Wt 230.0 lb

## 2022-10-30 DIAGNOSIS — I10 Essential (primary) hypertension: Secondary | ICD-10-CM

## 2022-10-30 DIAGNOSIS — I272 Pulmonary hypertension, unspecified: Secondary | ICD-10-CM | POA: Diagnosis not present

## 2022-10-30 DIAGNOSIS — R001 Bradycardia, unspecified: Secondary | ICD-10-CM

## 2022-10-30 DIAGNOSIS — I480 Paroxysmal atrial fibrillation: Secondary | ICD-10-CM | POA: Diagnosis not present

## 2022-10-30 NOTE — Patient Instructions (Signed)
   Testing/Procedures:  Your physician has requested that you have an echocardiogram. Echocardiography is a painless test that uses sound waves to create images of your heart. It provides your doctor with information about the size and shape of your heart and how well your heart's chambers and valves are working. This procedure takes approximately one hour. There are no restrictions for this procedure. Please do NOT wear cologne, perfume, aftershave, or lotions (deodorant is allowed). Please arrive 15 minutes prior to your appointment time. [place] MARCH 2024   Follow-Up: At [facility], you and your health needs are our priority.  As part of our continuing mission to provide you with exceptional heart care, we have created designated Provider Care Teams.  These Care Teams include your primary Cardiologist (physician) and Advanced Practice Providers (APPs -  Physician Assistants and Nurse Practitioners) who all work together to provide you with the care you need, when you need it.  We recommend signing up for the patient portal called "MyChart".  Sign up information is provided on this After Visit Summary.  MyChart is used to connect with patients for Virtual Visits (Telemedicine).  Patients are able to view lab/test results, encounter notes, upcoming appointments, etc.  Non-urgent messages can be sent to your provider as well.   To learn more about what you can do with MyChart, go to [email or web address].    Your next appointment:   12 month(s)  The format for your next appointment:   In Person  Provider:   Minus Breeding, MD

## 2022-10-31 ENCOUNTER — Ambulatory Visit (INDEPENDENT_AMBULATORY_CARE_PROVIDER_SITE_OTHER): Payer: Medicare Other

## 2022-10-31 DIAGNOSIS — I440 Atrioventricular block, first degree: Secondary | ICD-10-CM

## 2022-10-31 LAB — CUP PACEART REMOTE DEVICE CHECK
Battery Remaining Longevity: 131 mo
Battery Voltage: 3.01 V
Brady Statistic RA Percent Paced: 0.93 %
Brady Statistic RV Percent Paced: 54.62 %
Date Time Interrogation Session: 20231205190407
Implantable Lead Connection Status: 753985
Implantable Lead Connection Status: 753985
Implantable Lead Implant Date: 20201207
Implantable Lead Location: 753859
Implantable Lead Location: 753860
Implantable Lead Model: 4076
Implantable Lead Model: 4076
Implantable Pulse Generator Implant Date: 20201207
Lead Channel Impedance Value: 342 Ohm
Lead Channel Impedance Value: 418 Ohm
Lead Channel Impedance Value: 456 Ohm
Lead Channel Impedance Value: 494 Ohm
Lead Channel Pacing Threshold Amplitude: 0.625 V
Lead Channel Pacing Threshold Amplitude: 0.625 V
Lead Channel Pacing Threshold Pulse Width: 0.4 ms
Lead Channel Pacing Threshold Pulse Width: 0.4 ms
Lead Channel Sensing Intrinsic Amplitude: 1 mV
Lead Channel Sensing Intrinsic Amplitude: 1 mV
Lead Channel Sensing Intrinsic Amplitude: 19 mV
Lead Channel Sensing Intrinsic Amplitude: 19 mV
Lead Channel Setting Pacing Amplitude: 1.5 V
Lead Channel Setting Pacing Amplitude: 2.5 V
Lead Channel Setting Pacing Pulse Width: 0.4 ms
Lead Channel Setting Sensing Sensitivity: 0.9 mV
Zone Setting Status: 755011
Zone Setting Status: 755011

## 2022-11-03 ENCOUNTER — Other Ambulatory Visit: Payer: Self-pay | Admitting: Medical

## 2022-11-06 ENCOUNTER — Ambulatory Visit: Payer: Medicare Other | Admitting: Podiatry

## 2022-11-23 ENCOUNTER — Other Ambulatory Visit: Payer: Self-pay | Admitting: Medical

## 2022-11-23 NOTE — Progress Notes (Signed)
Remote pacemaker transmission.   

## 2022-12-04 ENCOUNTER — Encounter: Payer: Self-pay | Admitting: Internal Medicine

## 2022-12-05 ENCOUNTER — Telehealth: Payer: Self-pay | Admitting: Medical

## 2022-12-05 NOTE — Telephone Encounter (Signed)
Left message for patient to call back and schedule Medicare Annual Wellness Visit (AWV) either virtually or in office. Left  my Herbie Drape number [phone]   awvi 11/26/09 per [facility]   please schedule with Nurse Health Adviser   45 min for awv-i  in office appointments 30 min for awv-s & awv-i  phone/virtual appointments

## 2022-12-19 DIAGNOSIS — M654 Radial styloid tenosynovitis [de Quervain]: Secondary | ICD-10-CM | POA: Diagnosis not present

## 2022-12-19 DIAGNOSIS — M65331 Trigger finger, right middle finger: Secondary | ICD-10-CM | POA: Diagnosis not present

## 2022-12-19 DIAGNOSIS — M19041 Primary osteoarthritis, right hand: Secondary | ICD-10-CM | POA: Diagnosis not present

## 2023-01-14 ENCOUNTER — Other Ambulatory Visit: Payer: Self-pay | Admitting: Medical

## 2023-01-26 ENCOUNTER — Other Ambulatory Visit: Payer: Self-pay | Admitting: Medical

## 2023-01-26 ENCOUNTER — Other Ambulatory Visit: Payer: Self-pay | Admitting: Physician Assistant

## 2023-01-27 DIAGNOSIS — E119 Type 2 diabetes mellitus without complications: Secondary | ICD-10-CM | POA: Diagnosis not present

## 2023-01-27 LAB — HM DIABETES EYE EXAM

## 2023-01-28 ENCOUNTER — Encounter: Payer: Self-pay | Admitting: Internal Medicine

## 2023-01-28 NOTE — Telephone Encounter (Signed)
Left message for pt to call back to see if she needs refills before appt

## 2023-01-28 NOTE — Telephone Encounter (Signed)
Prescription refill request for Xarelto received.  Indication: PAF Last office visit: 10/30/22  Vita Barley MD Weight: 104.3kg Age: 82 Scr: 0.99 on 06/12/22 CrCl: 74.63  Based on above findings Xarelto '20mg'$  daily is the appropriate dose.  Refill approved.

## 2023-01-30 ENCOUNTER — Ambulatory Visit: Payer: Medicare Other

## 2023-01-30 ENCOUNTER — Encounter (HOSPITAL_COMMUNITY): Payer: Self-pay

## 2023-01-30 ENCOUNTER — Other Ambulatory Visit (HOSPITAL_COMMUNITY): Payer: Medicare Other

## 2023-01-30 DIAGNOSIS — I440 Atrioventricular block, first degree: Secondary | ICD-10-CM | POA: Diagnosis not present

## 2023-01-30 LAB — CUP PACEART REMOTE DEVICE CHECK
Battery Remaining Longevity: 127 mo
Battery Voltage: 3.01 V
Brady Statistic RA Percent Paced: 0.4 %
Brady Statistic RV Percent Paced: 64.4 %
Date Time Interrogation Session: 20240306042458
Implantable Lead Connection Status: 753985
Implantable Lead Connection Status: 753985
Implantable Lead Implant Date: 20201207
Implantable Lead Location: 753859
Implantable Lead Location: 753860
Implantable Lead Model: 4076
Implantable Lead Model: 4076
Implantable Pulse Generator Implant Date: 20201207
Lead Channel Impedance Value: 323 Ohm
Lead Channel Impedance Value: 399 Ohm
Lead Channel Impedance Value: 437 Ohm
Lead Channel Impedance Value: 494 Ohm
Lead Channel Pacing Threshold Amplitude: 0.625 V
Lead Channel Pacing Threshold Amplitude: 0.625 V
Lead Channel Pacing Threshold Pulse Width: 0.4 ms
Lead Channel Pacing Threshold Pulse Width: 0.4 ms
Lead Channel Sensing Intrinsic Amplitude: 0.875 mV
Lead Channel Sensing Intrinsic Amplitude: 0.875 mV
Lead Channel Sensing Intrinsic Amplitude: 16.25 mV
Lead Channel Sensing Intrinsic Amplitude: 16.25 mV
Lead Channel Setting Pacing Amplitude: 1.5 V
Lead Channel Setting Pacing Amplitude: 2.5 V
Lead Channel Setting Pacing Pulse Width: 0.4 ms
Lead Channel Setting Sensing Sensitivity: 0.9 mV
Zone Setting Status: 755011
Zone Setting Status: 755011

## 2023-02-01 ENCOUNTER — Other Ambulatory Visit: Payer: Self-pay | Admitting: Medical

## 2023-02-04 ENCOUNTER — Ambulatory Visit: Payer: Medicare Other | Admitting: Medical

## 2023-02-05 ENCOUNTER — Other Ambulatory Visit: Payer: Self-pay | Admitting: Cardiology

## 2023-02-19 ENCOUNTER — Telehealth: Payer: Self-pay | Admitting: Medical

## 2023-02-19 NOTE — Telephone Encounter (Signed)
Called patient to schedule Medicare Annual Wellness Visit (AWV). Left message for patient to call back and schedule Medicare Annual Wellness Visit (AWV).  Last date of AWV:  awvi 11/26/09 per [facility]   Please schedule an appointment at any time with [facility].  If any questions, please contact me at [phone].  Thank you ,  [name] AWV direct phone # [phone]

## 2023-02-26 ENCOUNTER — Ambulatory Visit (HOSPITAL_COMMUNITY): Payer: Medicare Other | Attending: Cardiology

## 2023-02-26 DIAGNOSIS — I272 Pulmonary hypertension, unspecified: Secondary | ICD-10-CM

## 2023-02-26 LAB — ECHOCARDIOGRAM COMPLETE
Calc EF: 61.3 %
S' Lateral: 2.5 cm
Single Plane A2C EF: 62.3 %
Single Plane A4C EF: 59.7 %

## 2023-03-06 NOTE — Progress Notes (Signed)
Remote pacemaker transmission.   

## 2023-03-12 ENCOUNTER — Other Ambulatory Visit: Payer: Self-pay | Admitting: Medical

## 2023-03-13 ENCOUNTER — Ambulatory Visit (INDEPENDENT_AMBULATORY_CARE_PROVIDER_SITE_OTHER): Payer: Medicare Other | Admitting: Medical

## 2023-03-13 ENCOUNTER — Encounter: Payer: Self-pay | Admitting: Medical

## 2023-03-13 VITALS — BP 110/64 | HR 72 | Ht 63.0 in | Wt 224.2 lb

## 2023-03-13 DIAGNOSIS — I272 Pulmonary hypertension, unspecified: Secondary | ICD-10-CM | POA: Diagnosis not present

## 2023-03-13 DIAGNOSIS — E785 Hyperlipidemia, unspecified: Secondary | ICD-10-CM

## 2023-03-13 DIAGNOSIS — I10 Essential (primary) hypertension: Secondary | ICD-10-CM | POA: Diagnosis not present

## 2023-03-13 DIAGNOSIS — Z0000 Encounter for general adult medical examination without abnormal findings: Secondary | ICD-10-CM | POA: Diagnosis not present

## 2023-03-13 DIAGNOSIS — R001 Bradycardia, unspecified: Secondary | ICD-10-CM

## 2023-03-13 DIAGNOSIS — I700 Atherosclerosis of aorta: Secondary | ICD-10-CM | POA: Diagnosis not present

## 2023-03-13 DIAGNOSIS — Z7901 Long term (current) use of anticoagulants: Secondary | ICD-10-CM | POA: Diagnosis not present

## 2023-03-13 DIAGNOSIS — Z780 Asymptomatic menopausal state: Secondary | ICD-10-CM

## 2023-03-13 DIAGNOSIS — Z7189 Other specified counseling: Secondary | ICD-10-CM

## 2023-03-13 DIAGNOSIS — E039 Hypothyroidism, unspecified: Secondary | ICD-10-CM | POA: Diagnosis not present

## 2023-03-13 DIAGNOSIS — Z950 Presence of cardiac pacemaker: Secondary | ICD-10-CM | POA: Diagnosis not present

## 2023-03-13 DIAGNOSIS — Q845 Enlarged and hypertrophic nails: Secondary | ICD-10-CM

## 2023-03-13 DIAGNOSIS — E118 Type 2 diabetes mellitus with unspecified complications: Secondary | ICD-10-CM

## 2023-03-13 DIAGNOSIS — D649 Anemia, unspecified: Secondary | ICD-10-CM | POA: Diagnosis not present

## 2023-03-13 LAB — HEMOGLOBIN A1C

## 2023-03-13 LAB — POCT URINALYSIS DIP (PROADVANTAGE DEVICE)
Bilirubin, UA: NEGATIVE
Glucose, UA: NEGATIVE mg/dL
Ketones, POC UA: NEGATIVE mg/dL
Leukocytes, UA: NEGATIVE
Nitrite, UA: NEGATIVE
Protein Ur, POC: 30 mg/dL — AB
Specific Gravity, Urine: 1.015
Urobilinogen, Ur: NEGATIVE
pH, UA: 6 (ref 5.0–8.0)

## 2023-03-13 NOTE — Patient Instructions (Signed)
This visit was a preventative care visit, also known as wellness visit or routine physical.   Topics typically include healthy lifestyle, diet, exercise, preventative care, vaccinations, sick and well care, proper use of emergency dept and after hours care, as well as other concerns.     General Recommendations: Continue to return yearly for your annual wellness and preventative care visits.  This gives [place] a chance to discuss healthy lifestyle, exercise, vaccinations, review your chart record, and perform screenings where appropriate.  I recommend you see your eye doctor yearly for routine vision care.  I recommend you see your dentist yearly for routine dental care including hygiene visits twice yearly.   Vaccination recommendations were reviewed Immunization History  Administered Date(s) Administered   COVID-19, mRNA, vaccine(Comirnaty)12 years and older 10/06/2022   Influenza Split 09/13/2020, 10/27/2021   Influenza, High Dose Seasonal PF 08/16/2018, 09/09/2019   Influenza-Unspecified 10/06/2022   PFIZER(Purple Top)SARS-COV-2 Vaccination 02/21/2020, 03/13/2020, 11/30/2020, 07/20/2021   PNEUMOCOCCAL CONJUGATE-20 10/27/2021   Pneumococcal Polysaccharide-23 12/21/2020   Td 04/15/2020   Zoster Recombinat (Shingrix) 10/10/2018, 12/21/2020    Bone health: Get at least 150 minutes of aerobic exercise weekly Get weight bearing exercise at least once weekly Bone density test:  A bone density test is an imaging test that uses a type of X-ray to measure the amount of calcium and other minerals in your bones. The test may be used to diagnose or screen you for a condition that causes weak or thin bones (osteoporosis), predict your risk for a broken bone (fracture), or determine how well your osteoporosis treatment is working. The bone density test is recommended for females 65 and older, or females or males <65 if certain risk factors such as thyroid disease, long term use of steroids such as  for asthma or rheumatological issues, vitamin D deficiency, estrogen deficiency, family history of osteoporosis, self or family history of fragility fracture in first degree relative.  Call to schedule bone density test  Please call to schedule your bone density test.   [facility]  [phone] [address] [place], [place] [address]   Heart health: Get at least 150 minutes of aerobic exercise weekly Limit alcohol It is important to maintain a healthy blood pressure and healthy cholesterol numbers  Heart disease screening: Screening for heart disease includes screening for blood pressure, fasting lipids, glucose/diabetes screening, BMI height to weight ratio, reviewed of smoking status, physical activity, and diet.    Goals include blood pressure 120/80 or less, maintaining a healthy lipid/cholesterol profile, preventing diabetes or keeping diabetes numbers under good control, not smoking or using tobacco products, exercising most days per week or at least 150 minutes per week of exercise, and eating healthy variety of fruits and vegetables, healthy oils, and avoiding unhealthy food choices like fried food, fast food, high sugar and high cholesterol foods.     Vascular disease screening: For high risk individuals including smokers, diabetes, patients with known heart disease or high blood pressure, kidney disease, and others, screening for vascular disease or atherosclerosis of the arteries is available.  Examples may include carotid ultrasound, abdominal aortic ultrasound, [name] blood flow screening in the legs, thoracic aorta screening.  Medical care options: I recommend you continue to seek care here first for routine care.  We try really hard to have available appointments Monday through Friday daytime hours for sick visits, acute visits, and physicals.  Urgent care should be used for after hours and weekends for significant  issues that cannot  wait till the next day.  The emergency department should be used for significant potentially life-threatening emergencies.  The emergency department is expensive, can often have long wait times for less significant concerns, so try to utilize primary care, urgent care, or telemedicine when possible to avoid unnecessary trips to the emergency department.  Virtual visits and telemedicine have been introduced since the pandemic started in 2020, and can be convenient ways to receive medical care.  We offer virtual appointments as well to assist you in a variety of options to seek medical care.   Legal Take the time to do a Last Will and Testament, advanced directives including Healthcare Power of Attorney and Living Will documents.  Do not leave your family with burdens that can be handled ahead of time.   Advanced Directives: I recommend you consider completing a Health Care Power of Attorney and Living Will.   These documents respect your wishes and help alleviate burdens on your loved ones if you were to become terminally ill or be in a position to need those documents enforced.    You can complete Advanced Directives yourself, have them notarized, then have copies made for our office, for you and for anybody you feel should have them in safe keeping.  Or, you can have an attorney prepare these documents.   If you haven't updated your Last Will and Testament in a while, it may be worthwhile having an attorney prepare these documents together and save on some costs.       Spiritual and Emotional Health Keeping a healthy spiritual life can help you better manage your physical health. Your spiritual life can help you to cope with any issues that may arise with your physical health.  Balance can keep [place] healthy and help [place] to recover.  If you are struggling with your spiritual health there are questions that you may want to ask yourself:  What makes me feel most complete? When do I feel most  connected to the rest of the world? Where do I find the most inner strength? What am I doing when I feel whole?  Helpful tips: Being in nature. Some people feel very connected and at peace when they are walking outdoors or are outside. Helping others. Some feel the largest sense of wellbeing when they are of service to others. Being of service can take on many forms. It can be doing volunteer work, being kind to strangers, or offering a hand to a friend in need. Gratitude. Some people find they feel the most connected when they remain grateful. They may make lists of all the things they are grateful for or say a thank you out loud for all they have.   Emotional Health Are you in tune with your emotional health?  Check out this link: http://www.marquez-love.com/   Financial Health Make sure you use a budget for your personal finances Make sure you are insured against risks (health insurance, life insurance, auto insurance, etc) Save more, spend less Set financial goals If you need help in this area, good resources include counseling through [employer] or other community resources, have a meeting with a Social research officer, government, and a good resource is [employer]

## 2023-03-13 NOTE — Progress Notes (Signed)
Subjective:   HPI  Darlene Boyer is a 82 y.o. female who presents for Chief Complaint  Patient presents with   fasting cpe/awv    Fasting awv/ cpe    Patient Care Team: Dinnis Rog, Kermit Balo, PA-C as PCP - General (Family Medicine) Rollene Rotunda, MD as PCP - Cardiology (Cardiology) Duke Salvia, MD as PCP - Electrophysiology (Cardiology) Dr. Rollene Rotunda, cardiology Dr. Sherryl Manges, cardiology Dr. Aquilla Hacker, ENT Dr. Ovid Curd, podiatry Dr. [name], hand surgery Dr. Erick Blinks, GI  Concerns: Doing ok  Diabetes - concerned that metformin may not be working strong enough, but not checking glucose lately.   No polyuria, no polydipsia, no diarrhea or side effects of metformin  Complaint with BP and heart medications  Compliant with cholesterol medicaiton without c/o .  Lives alone, but recently allowed daughter and her son to come live there   Reviewed their medical, surgical, family, social, medication, and allergy history and updated chart as appropriate.  No Known Allergies  Past Medical History:  Diagnosis Date   Arthritis    Atrial fibrillation    Diabetes mellitus without complication    Hypertension    Obesity    Thyroid disease     Current Outpatient Medications on File Prior to Visit  Medication Sig Dispense Refill   diltiazem (CARDIZEM CD) 120 MG 24 hr capsule TAKE 1 CAPSULE BY MOUTH EVERY DAY 90 capsule 3   furosemide (LASIX) 20 MG tablet TAKE 1 TABLET BY MOUTH EVERY DAY 90 tablet 3   levothyroxine (SYNTHROID) 88 MCG tablet TAKE 1 TABLET BY MOUTH EVERY DAY 90 tablet 0   loratadine (CLARITIN) 10 MG tablet Take 10 mg by mouth every other day.     losartan (COZAAR) 50 MG tablet TAKE 1 TABLET BY MOUTH EVERY DAY 90 tablet 0   metFORMIN (GLUCOPHAGE) 1000 MG tablet TAKE ONE TABLET BY MOUTH IN THE MORNING, AND 1/2 TABLET IN THE EVENING 135 tablet 1   metoprolol succinate (TOPROL-XL) 100 MG 24 hr tablet TAKE 1 TABLET BY MOUTH EVERY DAY 90  tablet 0   rosuvastatin (CRESTOR) 10 MG tablet TAKE 1 TABLET BY MOUTH EVERY DAY 90 tablet 0   XARELTO 20 MG TABS tablet TAKE 1 TABLET BY MOUTH DAILY WITH SUPPER. 90 tablet 1   No current facility-administered medications on file prior to visit.      Current Outpatient Medications:    diltiazem (CARDIZEM CD) 120 MG 24 hr capsule, TAKE 1 CAPSULE BY MOUTH EVERY DAY, Disp: 90 capsule, Rfl: 3   furosemide (LASIX) 20 MG tablet, TAKE 1 TABLET BY MOUTH EVERY DAY, Disp: 90 tablet, Rfl: 3   levothyroxine (SYNTHROID) 88 MCG tablet, TAKE 1 TABLET BY MOUTH EVERY DAY, Disp: 90 tablet, Rfl: 0   loratadine (CLARITIN) 10 MG tablet, Take 10 mg by mouth every other day., Disp: , Rfl:    losartan (COZAAR) 50 MG tablet, TAKE 1 TABLET BY MOUTH EVERY DAY, Disp: 90 tablet, Rfl: 0   metFORMIN (GLUCOPHAGE) 1000 MG tablet, TAKE ONE TABLET BY MOUTH IN THE MORNING, AND 1/2 TABLET IN THE EVENING, Disp: 135 tablet, Rfl: 1   metoprolol succinate (TOPROL-XL) 100 MG 24 hr tablet, TAKE 1 TABLET BY MOUTH EVERY DAY, Disp: 90 tablet, Rfl: 0   rosuvastatin (CRESTOR) 10 MG tablet, TAKE 1 TABLET BY MOUTH EVERY DAY, Disp: 90 tablet, Rfl: 0   XARELTO 20 MG TABS tablet, TAKE 1 TABLET BY MOUTH DAILY WITH SUPPER., Disp: 90 tablet, Rfl: 1  Family History  Problem Relation Age of Onset   Emphysema Father    Cancer Mother        told lung and breast   Colon cancer Neg Hx    Esophageal cancer Neg Hx    Rectal cancer Neg Hx    Stomach cancer Neg Hx     Past Surgical History:  Procedure Laterality Date   ABDOMINAL HYSTERECTOMY     partial   ANKLE FRACTURE SURGERY Right    COLONOSCOPY  01/27/2020   EYE SURGERY     Lens implants   FOOT SURGERY Left    Pins in 3 toes   GALLBLADDER SURGERY     PACEMAKER IMPLANT N/A 11/02/2019   Procedure: PACEMAKER IMPLANT;  Surgeon: Duke Salvia, MD;  Location: [facility] INVASIVE CV LAB;  Service: Cardiovascular;  Laterality: N/A;   TUBAL LIGATION     UPPER GASTROINTESTINAL ENDOSCOPY  01/27/2020     Review of Systems  Constitutional:  Negative for chills, fever, malaise/fatigue and weight loss.  HENT:  Negative for congestion, ear pain, hearing loss, sore throat and tinnitus.   Eyes:  Negative for blurred vision, pain and redness.  Respiratory:  Negative for cough, hemoptysis and shortness of breath.   Cardiovascular:  Negative for chest pain, palpitations, orthopnea, claudication and leg swelling.  Gastrointestinal:  Negative for abdominal pain, blood in stool, constipation, diarrhea, nausea and vomiting.  Genitourinary:  Negative for dysuria, flank pain, frequency, hematuria and urgency.  Musculoskeletal:  Negative for falls, joint pain and myalgias.  Skin:  Negative for itching and rash.  Neurological:  Negative for dizziness, tingling, speech change, weakness and headaches.  Endo/Heme/Allergies:  Negative for polydipsia. Does not bruise/bleed easily.  Psychiatric/Behavioral:  Negative for depression and memory loss. The patient is not nervous/anxious and does not have insomnia.        Objective:  BP 110/64   Pulse 72   Ht  (1.6 m)   Wt 224 lb 3.2 oz (101.7 kg)   BMI 39.72 kg/m   General appearance: alert, no distress, WD/WN, African American female Skin: unremarkable HEENT: normocephalic, conjunctiva/corneas normal, sclerae anicteric, PERRLA, EOMi, nares patent, no discharge or erythema, pharynx normal Oral cavity: MMM, tongue normal, teeth in good repair Neck: supple, no lymphadenopathy, no thyromegaly, no masses, normal ROM, no bruits Chest: non tender, normal shape and expansion Heart: RRR, normal S1, S2, no murmurs Lungs: CTA bilaterally, no wheezes, rhonchi, or rales Abdomen: +bs, soft, non tender, non distended, no masses, no hepatomegaly, no splenomegaly, no bruits Back: non tender, normal ROM, no scoliosis Musculoskeletal: upper extremities non tender, no obvious deformity, normal ROM throughout, lower extremities non tender, no obvious deformity, normal  ROM throughout Extremities: no edema, no cyanosis, no clubbing Pulses: 2+ symmetric, upper and lower extremities, normal cap refill Neurological: alert, oriented x 3, CN2-12 intact, strength normal upper extremities and lower extremities, sensation normal throughout, DTRs 2+ throughout, no cerebellar signs, gait normal Psychiatric: normal affect, behavior normal, pleasant  Breast/gyn/rectal - deferred   Diabetic Foot Exam - Simple   Simple Foot Form Visual Inspection See comments: Yes Sensation Testing Intact to touch and monofilament testing bilaterally: Yes Pulse Check Posterior Tibialis and Dorsalis pulse intact bilaterally: Yes Comments Flat feet, hypertrophic nails      Assessment and Plan :   Encounter Diagnoses  Name Primary?   Encounter for health maintenance examination in adult Yes   Postmenopausal estrogen deficiency    Current use of long term anticoagulation  Pacemaker-MDT    Type 2 diabetes mellitus with complication, without long-term current use of insulin    Hypothyroidism, unspecified type    Pulmonary HTN    Primary hypertension    Bradycardia    Enlarged and hypertrophic nails    Hyperlipidemia, unspecified hyperlipidemia type    Aortic atherosclerosis    Advanced directives, counseling/discussion     This visit was a preventative care visit, also known as wellness visit or routine physical.   Topics typically include healthy lifestyle, diet, exercise, preventative care, vaccinations, sick and well care, proper use of emergency dept and after hours care, as well as other concerns.    Separate significant issues discussed: Continue current medications blood pressure, cholesterol, aortic atherosclerosis.  We discussed metformin as a medicine for diabetes.  Follow-up pending labs.  Advise she start checking her sugars regularly  She sees podiatry for enlarged hypertrophic nails  Continue routine follow-up with cardiology   General  Recommendations: Continue to return yearly for your annual wellness and preventative care visits.  This gives [place] a chance to discuss healthy lifestyle, exercise, vaccinations, review your chart record, and perform screenings where appropriate.  I recommend you see your eye doctor yearly for routine vision care.  I recommend you see your dentist yearly for routine dental care including hygiene visits twice yearly.   Vaccination recommendations were reviewed Immunization History  Administered Date(s) Administered   COVID-19, mRNA, vaccine(Comirnaty)12 years and older 10/06/2022   Influenza Split 09/13/2020, 10/27/2021   Influenza, High Dose Seasonal PF 08/16/2018, 09/09/2019   Influenza-Unspecified 10/06/2022   PFIZER(Purple Top)SARS-COV-2 Vaccination 02/21/2020, 03/13/2020, 11/30/2020, 07/20/2021   PNEUMOCOCCAL CONJUGATE-20 10/27/2021   Pneumococcal Polysaccharide-23 12/21/2020   Td 04/15/2020   Zoster Recombinat (Shingrix) 10/10/2018, 12/21/2020    Bone health: Get at least 150 minutes of aerobic exercise weekly Get weight bearing exercise at least once weekly Bone density test:  A bone density test is an imaging test that uses a type of X-ray to measure the amount of calcium and other minerals in your bones. The test may be used to diagnose or screen you for a condition that causes weak or thin bones (osteoporosis), predict your risk for a broken bone (fracture), or determine how well your osteoporosis treatment is working. The bone density test is recommended for females 65 and older, or females or males <65 if certain risk factors such as thyroid disease, long term use of steroids such as for asthma or rheumatological issues, vitamin D deficiency, estrogen deficiency, family history of osteoporosis, self or family history of fragility fracture in first degree relative.  Call to schedule bone density test  Please call to schedule your bone density test.   [facility]  [phone] [address] [place], [place] [address]   Heart health: Get at least 150 minutes of aerobic exercise weekly Limit alcohol It is important to maintain a healthy blood pressure and healthy cholesterol numbers  Heart disease screening: Screening for heart disease includes screening for blood pressure, fasting lipids, glucose/diabetes screening, BMI height to weight ratio, reviewed of smoking status, physical activity, and diet.    Goals include blood pressure 120/80 or less, maintaining a healthy lipid/cholesterol profile, preventing diabetes or keeping diabetes numbers under good control, not smoking or using tobacco products, exercising most days per week or at least 150 minutes per week of exercise, and eating healthy variety of fruits and vegetables, healthy oils, and avoiding unhealthy food choices like fried food, fast  food, high sugar and high cholesterol foods.     Vascular disease screening: For high risk individuals including smokers, diabetes, patients with known heart disease or high blood pressure, kidney disease, and others, screening for vascular disease or atherosclerosis of the arteries is available.  Examples may include carotid ultrasound, abdominal aortic ultrasound, [name] blood flow screening in the legs, thoracic aorta screening.  Medical care options: I recommend you continue to seek care here first for routine care.  We try really hard to have available appointments Monday through Friday daytime hours for sick visits, acute visits, and physicals.  Urgent care should be used for after hours and weekends for significant issues that cannot wait till the next day.  The emergency department should be used for significant potentially life-threatening emergencies.  The emergency department is expensive, can often have long wait times for less significant concerns, so try to utilize primary care, urgent care, or telemedicine when possible  to avoid unnecessary trips to the emergency department.  Virtual visits and telemedicine have been introduced since the pandemic started in 2020, and can be convenient ways to receive medical care.  We offer virtual appointments as well to assist you in a variety of options to seek medical care.   Legal Take the time to do a Last Will and Testament, advanced directives including Healthcare Power of Attorney and Living Will documents.  Do not leave your family with burdens that can be handled ahead of time.   Advanced Directives: I recommend you consider completing a Health Care Power of Attorney and Living Will.   These documents respect your wishes and help alleviate burdens on your loved ones if you were to become terminally ill or be in a position to need those documents enforced.    You can complete Advanced Directives yourself, have them notarized, then have copies made for our office, for you and for anybody you feel should have them in safe keeping.  Or, you can have an attorney prepare these documents.   If you haven't updated your Last Will and Testament in a while, it may be worthwhile having an attorney prepare these documents together and save on some costs.       Spiritual and Emotional Health Keeping a healthy spiritual life can help you better manage your physical health. Your spiritual life can help you to cope with any issues that may arise with your physical health.  Balance can keep [place] healthy and help [place] to recover.  If you are struggling with your spiritual health there are questions that you may want to ask yourself:  What makes me feel most complete? When do I feel most connected to the rest of the world? Where do I find the most inner strength? What am I doing when I feel whole?  Helpful tips: Being in nature. Some people feel very connected and at peace when they are walking outdoors or are outside. Helping others. Some feel the largest sense of wellbeing when  they are of service to others. Being of service can take on many forms. It can be doing volunteer work, being kind to strangers, or offering a hand to a friend in need. Gratitude. Some people find they feel the most connected when they remain grateful. They may make lists of all the things they are grateful for or say a thank you out loud for all they have.   Emotional Health Are you in tune with your emotional health?  Check out this link: http://www.marquez-love.com/  Financial Health Make sure you use a budget for your personal finances Make sure you are insured against risks (health insurance, life insurance, auto insurance, etc) Save more, spend less Set financial goals If you need help in this area, good resources include counseling through [employer] or other community resources, have a meeting with a Social research officer, government, and a good resource is Salley Slaughter podcast    Darlene Boyer was seen today for fasting cpe/awv.  Diagnoses and all orders for this visit:  Encounter for health maintenance examination in adult -     DG Bone Density; Future -     Comprehensive metabolic panel -     CBC -     TSH -     Lipid panel -     Hemoglobin A1c -     Microalbumin/Creatinine Ratio, Urine -     POCT Urinalysis DIP (Proadvantage Device)  Postmenopausal estrogen deficiency -     DG Bone Density; Future  Current use of long term anticoagulation   Pacemaker-MDT  Type 2 diabetes mellitus with complication, without long-term current use of insulin -     Hemoglobin A1c -     Microalbumin/Creatinine Ratio, Urine -     POCT Urinalysis DIP (Proadvantage Device)  Hypothyroidism, unspecified type -     TSH  Pulmonary HTN  Primary hypertension  Bradycardia  Enlarged and hypertrophic nails  Hyperlipidemia, unspecified hyperlipidemia type -     Lipid panel  Aortic atherosclerosis  Advanced directives, counseling/discussion    Follow-up pending labs, yearly for  physical

## 2023-03-14 ENCOUNTER — Other Ambulatory Visit: Payer: Self-pay | Admitting: Medical

## 2023-03-14 LAB — COMPREHENSIVE METABOLIC PANEL
ALT: 10 IU/L (ref 0–32)
AST: 19 IU/L (ref 0–40)
Albumin/Globulin Ratio: 2 (ref 1.2–2.2)
Albumin: 4.3 g/dL (ref 3.8–4.8)
Alkaline Phosphatase: 70 IU/L (ref 44–121)
BUN/Creatinine Ratio: 11 — ABNORMAL LOW (ref 12–28)
BUN: 10 mg/dL (ref 8–27)
Bilirubin Total: 0.4 mg/dL (ref 0.0–1.2)
CO2: 25 mmol/L (ref 20–29)
Calcium: 9.2 mg/dL (ref 8.7–10.3)
Chloride: 104 mmol/L (ref 96–106)
Creatinine, Ser: 0.95 mg/dL (ref 0.57–1.00)
Globulin, Total: 2.1 g/dL (ref 1.5–4.5)
Glucose: 170 mg/dL — ABNORMAL HIGH (ref 70–99)
Potassium: 3.6 mmol/L (ref 3.5–5.2)
Sodium: 144 mmol/L (ref 134–144)
Total Protein: 6.4 g/dL (ref 6.0–8.5)
eGFR: 61 mL/min/{1.73_m2} (ref >59)

## 2023-03-14 LAB — LIPID PANEL
Chol/HDL Ratio: 1.6 ratio (ref 0.0–4.4)
Cholesterol, Total: 134 mg/dL (ref 100–199)
HDL: 84 mg/dL (ref >39)
LDL Chol Calc (NIH): 39 mg/dL (ref 0–99)
Triglycerides: 47 mg/dL (ref 0–149)
VLDL Cholesterol Cal: 11 mg/dL (ref 5–40)

## 2023-03-14 LAB — CBC
Hematocrit: 32.8 % — ABNORMAL LOW (ref 34.0–46.6)
Hemoglobin: 10.7 g/dL — ABNORMAL LOW (ref 11.1–15.9)
MCH: 30.3 pg (ref 26.6–33.0)
MCHC: 32.6 g/dL (ref 31.5–35.7)
MCV: 93 fL (ref 79–97)
Platelets: 199 10*3/uL (ref 150–450)
RBC: 3.53 x10E6/uL — ABNORMAL LOW (ref 3.77–5.28)
RDW: 13.1 % (ref 11.7–15.4)
WBC: 3.7 10*3/uL (ref 3.4–10.8)

## 2023-03-14 LAB — TSH: TSH: 0.663 u[IU]/mL (ref 0.450–4.500)

## 2023-03-14 LAB — HEMOGLOBIN A1C: Est. average glucose Bld gHb Est-mCnc: 166 mg/dL

## 2023-03-14 LAB — MICROALBUMIN / CREATININE URINE RATIO
Creatinine, Urine: 150.8 mg/dL
Microalb/Creat Ratio: 32 mg/g creat — ABNORMAL HIGH (ref 0–29)
Microalbumin, Urine: 47.7 ug/mL

## 2023-03-14 NOTE — Progress Notes (Signed)
Kidney liver and electrolytes normal, diabetes marker 7.4% relatively stable.  Thyroid okay, cholesterol okay.  still pending microalbumin kidney marker.  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  I would like you to do fecal tube test to check for blood in the stool given the anemia.  I will have my nurse get you the kit to do this.  In addition to metformin if you want to add a second medicine to help with weight loss and sugar control, I recommend something like the weekly injection for added once daily pill depending on what insurance will cover.  Let me know if you are open to trying something   (See if lab can add iron and ferritin level)

## 2023-03-15 LAB — IRON

## 2023-03-16 LAB — SPECIMEN STATUS REPORT

## 2023-03-16 LAB — FERRITIN: Ferritin: 44 ng/mL (ref 15–150)

## 2023-03-18 ENCOUNTER — Other Ambulatory Visit: Payer: Self-pay | Admitting: Medical

## 2023-03-18 ENCOUNTER — Other Ambulatory Visit: Payer: Self-pay | Admitting: Cardiology

## 2023-03-18 ENCOUNTER — Other Ambulatory Visit: Payer: Self-pay | Admitting: Internal Medicine

## 2023-03-18 DIAGNOSIS — Z1211 Encounter for screening for malignant neoplasm of colon: Secondary | ICD-10-CM

## 2023-03-18 MED ORDER — TRULICITY 0.75 MG/0.5ML ~~LOC~~ SOAJ: 0.7500 mg | SUBCUTANEOUS | 0 refills | Status: DC

## 2023-03-18 MED ORDER — FERROUS GLUCONATE 324 (38 FE) MG PO TABS: 324.0000 mg | ORAL_TABLET | Freq: Every day | ORAL | 0 refills | Status: DC

## 2023-03-18 NOTE — Progress Notes (Signed)
Begin Fergon, ferrous gluconate once daily iron for the next 3 months.  Turn in the stool test to screen for blood in stool  Begin Trulicity weekly injection lowest dose.  Lets give this a try to go along with her other medicines for sugar.  Potential side effects can include nausea acid reflux or constipation.

## 2023-03-19 ENCOUNTER — Telehealth: Payer: Self-pay | Admitting: Medical

## 2023-03-19 NOTE — Telephone Encounter (Signed)
Darlene Boyer called and wants to know if it is ok to take sea moss with all of the medications she is on?

## 2023-03-20 NOTE — Telephone Encounter (Signed)
Pt advised she is not going to take it.

## 2023-03-28 ENCOUNTER — Ambulatory Visit: Payer: Medicare Other | Admitting: Podiatry

## 2023-03-28 DIAGNOSIS — B351 Tinea unguium: Secondary | ICD-10-CM | POA: Diagnosis not present

## 2023-03-28 DIAGNOSIS — L84 Corns and callosities: Secondary | ICD-10-CM

## 2023-03-28 DIAGNOSIS — M21621 Bunionette of right foot: Secondary | ICD-10-CM

## 2023-03-28 DIAGNOSIS — Z7901 Long term (current) use of anticoagulants: Secondary | ICD-10-CM

## 2023-03-28 NOTE — Progress Notes (Signed)
Subjective: Chief Complaint  Patient presents with   Nail Problem    Diabetic Foot Care. Patient injects insulin and takes oral medication.    82 year old female presents the office with above concerns.  She says nails are thickened elongated she cannot trim them herself.  She also is warmth, pain along the right fifth metatarsal that she points to.  She is not sure if she has a bunion going on as well.  No injuries.  No open lesions.  Objective: AAO x3, NAD DP/PT pulses palpable bilaterally, CRT less than 3 seconds Nails are hypertrophic, dystrophic, brittle, discolored, elongated 10. No surrounding redness or drainage. Tenderness nails 1-5 bilaterally.  Hyperkeratotic lesion noted right fifth metatarsal head without any underlying ulceration drainage or signs of infection.  There is mild tailor's bunion present. No pain with calf compression, swelling, warmth, erythema  Assessment: 82 year old female with hyperkeratotic lesion due to tailor's bunion, symptomatic onychomycosis  Plan: -All treatment options discussed with the patient including all alternatives, risks, complications.  -Sharply debrided nails x 10 without any complications or bleeding.  Discussed treatment options.  She has a topical medication at home which she uses intermittently.  Recommend using this more consistently. -As a courtesy debrided the calluses and complications of bleeding.  Dispensed offloading pad the to the foot and discussed shoe modifications avoid excess pressure.  She can use moisturizer as needed as well. -Patient encouraged to call the office with any questions, concerns, change in symptoms.   Vivi Barrack DPM

## 2023-03-29 DIAGNOSIS — M654 Radial styloid tenosynovitis [de Quervain]: Secondary | ICD-10-CM | POA: Diagnosis not present

## 2023-04-06 ENCOUNTER — Other Ambulatory Visit: Payer: Self-pay | Admitting: Medical

## 2023-04-29 ENCOUNTER — Other Ambulatory Visit: Payer: Self-pay | Admitting: Medical

## 2023-05-01 ENCOUNTER — Ambulatory Visit (INDEPENDENT_AMBULATORY_CARE_PROVIDER_SITE_OTHER): Payer: Medicare Other

## 2023-05-01 DIAGNOSIS — I440 Atrioventricular block, first degree: Secondary | ICD-10-CM

## 2023-05-01 LAB — CUP PACEART REMOTE DEVICE CHECK
Battery Remaining Longevity: 123 mo
Battery Voltage: 3.01 V
Brady Statistic RA Percent Paced: 0.89 %
Brady Statistic RV Percent Paced: 60.32 %
Date Time Interrogation Session: 20240605052327
Implantable Lead Connection Status: 753985
Implantable Lead Connection Status: 753985
Implantable Lead Implant Date: 20201207
Implantable Lead Location: 753859
Implantable Lead Location: 753860
Implantable Lead Model: 4076
Implantable Lead Model: 4076
Implantable Pulse Generator Implant Date: 20201207
Lead Channel Impedance Value: 342 Ohm
Lead Channel Impedance Value: 399 Ohm
Lead Channel Impedance Value: 437 Ohm
Lead Channel Impedance Value: 475 Ohm
Lead Channel Pacing Threshold Amplitude: 0.625 V
Lead Channel Pacing Threshold Amplitude: 0.625 V
Lead Channel Pacing Threshold Pulse Width: 0.4 ms
Lead Channel Pacing Threshold Pulse Width: 0.4 ms
Lead Channel Sensing Intrinsic Amplitude: 17.375 mV
Lead Channel Sensing Intrinsic Amplitude: 17.375 mV
Lead Channel Sensing Intrinsic Amplitude: 2.25 mV
Lead Channel Sensing Intrinsic Amplitude: 2.25 mV
Lead Channel Setting Pacing Amplitude: 1.5 V
Lead Channel Setting Pacing Amplitude: 2.5 V
Lead Channel Setting Pacing Pulse Width: 0.4 ms
Lead Channel Setting Sensing Sensitivity: 0.9 mV
Zone Setting Status: 755011
Zone Setting Status: 755011

## 2023-05-24 NOTE — Progress Notes (Signed)
Remote pacemaker transmission.   

## 2023-06-28 ENCOUNTER — Ambulatory Visit: Payer: Medicare Other | Admitting: Podiatry

## 2023-06-30 ENCOUNTER — Other Ambulatory Visit: Payer: Self-pay | Admitting: Medical

## 2023-07-03 ENCOUNTER — Ambulatory Visit: Payer: Medicare Other | Admitting: Podiatry

## 2023-07-07 ENCOUNTER — Other Ambulatory Visit: Payer: Self-pay | Admitting: Cardiology

## 2023-07-08 NOTE — Telephone Encounter (Signed)
Prescription refill request for Xarelto received.  Indication:AFLUTTER Last office visit:12/23 Weight:101.7  kg Age:82 Scr:0.95  4/24 CrCl:75.83  ml/min  Prescription refilled

## 2023-07-10 ENCOUNTER — Ambulatory Visit: Payer: Medicare Other | Admitting: Podiatry

## 2023-07-31 ENCOUNTER — Ambulatory Visit (INDEPENDENT_AMBULATORY_CARE_PROVIDER_SITE_OTHER): Payer: Medicare Other

## 2023-07-31 DIAGNOSIS — I440 Atrioventricular block, first degree: Secondary | ICD-10-CM

## 2023-07-31 LAB — CUP PACEART REMOTE DEVICE CHECK
Battery Remaining Longevity: 119 mo
Battery Voltage: 3.01 V
Brady Statistic AP VP Percent: 3.56 %
Brady Statistic AP VS Percent: 85.98 %
Brady Statistic AS VP Percent: 6.92 %
Brady Statistic AS VS Percent: 3.48 %
Brady Statistic RA Percent Paced: 65.39 %
Brady Statistic RV Percent Paced: 26.9 %
Date Time Interrogation Session: 20240904003250
Implantable Lead Connection Status: 753985
Implantable Lead Connection Status: 753985
Implantable Lead Implant Date: 20201207
Implantable Lead Location: 753859
Implantable Lead Location: 753860
Implantable Lead Model: 4076
Implantable Lead Model: 4076
Implantable Pulse Generator Implant Date: 20201207
Lead Channel Impedance Value: 323 Ohm
Lead Channel Impedance Value: 380 Ohm
Lead Channel Impedance Value: 418 Ohm
Lead Channel Impedance Value: 475 Ohm
Lead Channel Pacing Threshold Amplitude: 0.625 V
Lead Channel Pacing Threshold Amplitude: 0.75 V
Lead Channel Pacing Threshold Pulse Width: 0.4 ms
Lead Channel Pacing Threshold Pulse Width: 0.4 ms
Lead Channel Sensing Intrinsic Amplitude: 15.125 mV
Lead Channel Sensing Intrinsic Amplitude: 15.125 mV
Lead Channel Sensing Intrinsic Amplitude: 2.75 mV
Lead Channel Sensing Intrinsic Amplitude: 2.75 mV
Lead Channel Setting Pacing Amplitude: 1.5 V
Lead Channel Setting Pacing Amplitude: 2.5 V
Lead Channel Setting Pacing Pulse Width: 0.4 ms
Lead Channel Setting Sensing Sensitivity: 0.9 mV
Zone Setting Status: 755011
Zone Setting Status: 755011

## 2023-08-12 NOTE — Progress Notes (Signed)
Remote pacemaker transmission.   

## 2023-08-15 DIAGNOSIS — M65331 Trigger finger, right middle finger: Secondary | ICD-10-CM | POA: Diagnosis not present

## 2023-08-16 ENCOUNTER — Encounter: Payer: Self-pay | Admitting: Podiatry

## 2023-08-16 ENCOUNTER — Ambulatory Visit (INDEPENDENT_AMBULATORY_CARE_PROVIDER_SITE_OTHER): Payer: Medicare Other | Admitting: Podiatry

## 2023-08-16 DIAGNOSIS — B351 Tinea unguium: Secondary | ICD-10-CM

## 2023-08-16 DIAGNOSIS — M79674 Pain in right toe(s): Secondary | ICD-10-CM

## 2023-08-16 DIAGNOSIS — Z7901 Long term (current) use of anticoagulants: Secondary | ICD-10-CM

## 2023-08-16 DIAGNOSIS — M79675 Pain in left toe(s): Secondary | ICD-10-CM

## 2023-08-16 DIAGNOSIS — E118 Type 2 diabetes mellitus with unspecified complications: Secondary | ICD-10-CM

## 2023-08-16 NOTE — Progress Notes (Signed)
This patient returns to my office for at risk foot care.  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  This patient is unable to cut nails herself since the patient cannot reach her nails.These nails are painful walking and wearing shoes.  This patient presents for at risk foot care today.  General Appearance  Alert, conversant and in no acute stress.  Vascular  Dorsalis pedis and posterior tibial  pulses are palpable  bilaterally.  Capillary return is within normal limits  bilaterally. Temperature is within normal limits  bilaterally.  Neurologic  Senn-Weinstein monofilament wire test within normal limits  bilaterally. Muscle power within normal limits bilaterally.  Nails Thick disfigured discolored nails with subungual debris  from hallux to fifth toes bilaterally. No evidence of bacterial infection or drainage bilaterally.  Orthopedic  No limitations of motion  feet .  No crepitus or effusions noted.  No bony pathology or digital deformities noted.  Skin  normotropic skin with no porokeratosis noted bilaterally.  No signs of infections or ulcers noted.     Onychomycosis  Pain in right toes  Pain in left toes  Consent was obtained for treatment procedures.   Mechanical debridement of nails 1-5  bilaterally performed with a nail nipper.  Filed with dremel without incident.    Return office visit   3 months                  Told patient to return for periodic foot care and evaluation due to potential at risk complications.   Helane Gunther DPM

## 2023-09-01 ENCOUNTER — Other Ambulatory Visit: Payer: Self-pay | Admitting: Medical

## 2023-09-27 ENCOUNTER — Other Ambulatory Visit: Payer: Self-pay | Admitting: Medical

## 2023-10-02 ENCOUNTER — Other Ambulatory Visit: Payer: Self-pay | Admitting: Medical

## 2023-10-02 NOTE — Telephone Encounter (Signed)
Left message for pt to call me back 

## 2023-10-02 NOTE — Telephone Encounter (Signed)
Pt has an appt tomorrow. Trying to see if she needs this before tomorrow

## 2023-10-03 ENCOUNTER — Emergency Department (HOSPITAL_COMMUNITY): Payer: Medicare Other

## 2023-10-03 ENCOUNTER — Encounter (HOSPITAL_COMMUNITY): Payer: Self-pay

## 2023-10-03 ENCOUNTER — Ambulatory Visit: Payer: Medicare Other | Admitting: Medical

## 2023-10-03 ENCOUNTER — Other Ambulatory Visit: Payer: Self-pay

## 2023-10-03 ENCOUNTER — Emergency Department (HOSPITAL_COMMUNITY)
Admission: EM | Admit: 2023-10-03 | Discharge: 2023-10-03 | Disposition: A | Discharge status: Home / Self Care | Payer: Medicare Other | Attending: Emergency Medicine | Admitting: Emergency Medicine

## 2023-10-03 DIAGNOSIS — Z79899 Other long term (current) drug therapy: Secondary | ICD-10-CM | POA: Insufficient documentation

## 2023-10-03 DIAGNOSIS — Z7984 Long term (current) use of oral hypoglycemic drugs: Secondary | ICD-10-CM | POA: Insufficient documentation

## 2023-10-03 DIAGNOSIS — E039 Hypothyroidism, unspecified: Secondary | ICD-10-CM | POA: Insufficient documentation

## 2023-10-03 DIAGNOSIS — X509XXA Other and unspecified overexertion or strenuous movements or postures, initial encounter: Secondary | ICD-10-CM | POA: Insufficient documentation

## 2023-10-03 DIAGNOSIS — Z7901 Long term (current) use of anticoagulants: Secondary | ICD-10-CM | POA: Diagnosis not present

## 2023-10-03 DIAGNOSIS — Y9301 Activity, walking, marching and hiking: Secondary | ICD-10-CM | POA: Diagnosis not present

## 2023-10-03 DIAGNOSIS — M1712 Unilateral primary osteoarthritis, left knee: Secondary | ICD-10-CM | POA: Diagnosis not present

## 2023-10-03 DIAGNOSIS — I10 Essential (primary) hypertension: Secondary | ICD-10-CM | POA: Insufficient documentation

## 2023-10-03 DIAGNOSIS — M25562 Pain in left knee: Secondary | ICD-10-CM | POA: Insufficient documentation

## 2023-10-03 DIAGNOSIS — E119 Type 2 diabetes mellitus without complications: Secondary | ICD-10-CM | POA: Diagnosis not present

## 2023-10-03 DIAGNOSIS — S8992XA Unspecified injury of left lower leg, initial encounter: Secondary | ICD-10-CM | POA: Diagnosis not present

## 2023-10-03 NOTE — ED Provider Notes (Addendum)
[facility] EMERGENCY DEPARTMENT AT [facility] Provider Note   CSN: [id number] Arrival date & time: 10/03/23  1045     History  Chief Complaint  Patient presents with   Knee Injury    left    Darlene Boyer is a 82 y.o. female with history of A-fib on Xarelto, hypertension, type 2 diabetes, hypothyroidism, presents with concern for pain in her left knee.  States she was walking and when she went to turn, heard a pop in her left knee.  She has been having difficulty walking on her left knee secondary to the pain.  Denies any numbness or tingling in her left lower extremity.  HPI     Home Medications Prior to Admission medications   Medication Sig Start Date End Date Taking? Authorizing Provider  ferrous gluconate (FERGON) 324 MG tablet Take 1 tablet (324 mg total) by mouth daily with breakfast. 03/18/23   Tysinger, Kermit Balo, PA-C  diltiazem (CARDIZEM CD) 120 MG 24 hr capsule Take 1 capsule (120 mg total) by mouth daily. 03/18/23   Rollene Rotunda, MD  Dulaglutide (TRULICITY) 0.75 MG/0.5ML SOAJ INJECT 0.75 MG SUBCUTANEOUSLY ONE TIME PER WEEK 09/27/23   Tysinger, Kermit Balo, PA-C  furosemide (LASIX) 20 MG tablet TAKE 1 TABLET BY MOUTH EVERY DAY 02/05/23   Rollene Rotunda, MD  levothyroxine (SYNTHROID) 88 MCG tablet TAKE 1 TABLET BY MOUTH EVERY DAY 07/01/23   Tysinger, Kermit Balo, PA-C  loratadine (CLARITIN) 10 MG tablet Take 10 mg by mouth every other day.    [provider]  losartan (COZAAR) 50 MG tablet TAKE 1 TABLET BY MOUTH EVERY DAY 04/08/23   Tysinger, Kermit Balo, PA-C  metFORMIN (GLUCOPHAGE) 1000 MG tablet TAKE 1 TABLET BY MOUTH IN THE MORNING, AND 1/2 TABLET IN THE EVENING 09/02/23   Tysinger, Kermit Balo, PA-C  metoprolol succinate (TOPROL-XL) 100 MG 24 hr tablet TAKE 1 TABLET BY MOUTH EVERY DAY 04/30/23   Tysinger, Kermit Balo, PA-C  rosuvastatin (CRESTOR) 10 MG tablet TAKE 1 TABLET BY MOUTH EVERY DAY 04/08/23   Tysinger, Kermit Balo, PA-C  XARELTO 20 MG TABS tablet TAKE 1 TABLET BY  MOUTH DAILY WITH SUPPER 07/08/23   Rollene Rotunda, MD      Allergies    Patient has no known allergies.    Review of Systems   Review of Systems  Musculoskeletal:        Left knee pain    Physical Exam Updated Vital Signs BP 134/61   Pulse 65   Temp 99.2 F (37.3 C) (Oral)   Resp 18   Ht 5\' 3"  (1.6 m)   Wt 98.9 kg   SpO2 100%   BMI 38.62 kg/m  Physical Exam Vitals and nursing note reviewed.  Constitutional:      Appearance: Normal appearance.  HENT:     Head: Atraumatic.  Cardiovascular:     Comments: 2+ dorsalis pedis pulse the left lower extremity Pulmonary:     Effort: Pulmonary effort is normal.  Musculoskeletal:     Comments: Left lower extremity: No obvious deformity, erythema of the left knee.  Mild edema of the knee diffusely. Tender to palpation over the MCL and distal posterior-medial aspect of the thigh.  No tenderness palpation of the femur, quadriceps tendon, patellar tendon, patella, medial or lateral joint lines, tibia, popliteal fossa  Able to flex left knee to approximately 45 degrees before limited by pain.  Able to perform straight leg raise.   No ligamentous laxity with Lachman's,  posterior drawer, valgus or varus stress. Pain with movement of the knee for these tests.   Difficulty with ambulation, antalgic gait  Neurological:     General: No focal deficit present.     Mental Status: She is alert.     Comments: Intact sensation of the left lower extremity  Psychiatric:        Mood and Affect: Mood normal.        Behavior: Behavior normal.     ED Results / Procedures / Treatments   Labs (all labs ordered are listed, but only abnormal results are displayed) Labs Reviewed - No data to display  EKG None  Radiology DG Knee Complete 4 Views Left  Result Date: 10/03/2023 CLINICAL DATA:  Left knee injury. EXAM: LEFT KNEE - COMPLETE 4+ VIEW COMPARISON:  None Available. FINDINGS: No acute fracture or dislocation. No aggressive osseous lesion.  There are degenerative changes of the knee joint in the form of mildly reduced medial tibio-femoral compartment joint space, and osteophytosis. No knee effusion or focal soft tissue swelling. No radiopaque foreign bodies. IMPRESSION: *No acute osseous abnormality of the left knee joint. *Mild degenerative osteoarthritis. Electronically Signed   By: Jules Schick M.D.   On: 10/03/2023 13:27    Procedures Procedures    Medications Ordered in ED Medications - No data to display  ED Course/ Medical Decision Making/ A&P                                 Medical Decision Making Amount and/or Complexity of Data Reviewed Radiology: ordered.     Differential diagnosis includes but is not limited to fracture, dislocation, sprain, ligamentous injury  ED Course:  Patient overall well-appearing, no acute distress.  She has tenderness palpation of the medial aspect of her left knee, and the distal posterior medial aspect of the thigh.  She is able to flex and extend the knee, but this is limited due to pain.  Able to perform straight leg raise, no concern for a quadriceps or patellar tendon injury at this time.  Neurovascular intact in the left lower extremity.  No ligamentous laxity appreciated on exam.  However, given location of pain and report of a pop heard, concern for possible MCL injury vs muscle strain/tear.  X-rays without any sign of fracture or dislocation.  Patient was placed in a hinged knee brace and urgently provided crutches for evaluation, but reports difficulty with this.  She was provided a walker.  Instructed to follow-up with orthopedics in the next week. Patient given Tylenol for pain   Impression: Left knee pain  Disposition:  The patient was discharged home with instructions to wear knee brace at all times, may take off for bathing and when at rest.  Use crutches to ambulate.  Follow-up with orthopedics within the next week.  Information for the orthopedics office provided.   Tylenol as needed for pain. Return precautions given.  Imaging Studies ordered: I ordered imaging studies including x-ray left knee I independently visualized the imaging with scope of interpretation limited to determining acute life threatening conditions related to emergency care. Imaging showed no fractures or dislocations I agree with the radiologist interpretation             Final Clinical Impression(s) / ED Diagnoses Final diagnoses:  Acute pain of left knee    Rx / DC Orders ED Discharge Orders     None  Arabella Merles, PA-C 10/03/23 1444    Arabella Merles, PA-C 10/03/23 1504    Pricilla Loveless, MD 10/05/23 [phone]

## 2023-10-03 NOTE — Discharge Instructions (Addendum)
Your x-ray today did not show any signs of a fracture or dislocation in your knee.  You may have a ligament injury, although it is hard to tell based off your exam today.  You have been placed in a hinged knee brace today.  Please wear this brace at all times until cleared by orthopedics. You may take it off for bathing/showering. You may also take it off if you are just resting and not moving the knee.  Please use the crutches to ambulate.  Follow-up with orthopedics within the next week.  Their office information is listed below.  You will need to contact them to make an appointment.  You may take up to 1000mg  of tylenol every 6 hours as needed for pain.  Do not take more then 4g per day.  Return the ER for any severe worsening of your pain, numbness or tingling in your foot, any other new or concerning symptoms.

## 2023-10-03 NOTE — Progress Notes (Signed)
Orthopedic Tech Progress Note Patient Details:  Darlene Boyer 07-Sep-1941 [id number]  Victorino Dike, RN made aware of pt refusing crutches. Pt then ambulated to the bathroom w/o assistive devices once the brace was applied and stated her pain is improving.  Ortho Devices Type of Ortho Device: Other (comment) (Neoprene knee brace) Ortho Device/Splint Location: LLE Ortho Device/Splint Interventions: Ordered, Application, Adjustment   Post Interventions Patient Tolerated: Well, Refused intervention (pt refusing crutches d/t personal safety concern and not wanting to fall.) Instructions Provided: Care of device, Adjustment of device  Darlene Boyer 10/03/2023, 2:47 PM

## 2023-10-03 NOTE — ED Triage Notes (Signed)
Pt to ED accompanied with daughter c/o left knee injury that occurred this morning, reports was walking down the hallway , pivoted and heard knee "pop" reports unable to put weight on left knee at this time.

## 2023-10-08 ENCOUNTER — Encounter (HOSPITAL_BASED_OUTPATIENT_CLINIC_OR_DEPARTMENT_OTHER): Payer: Self-pay | Admitting: Student

## 2023-10-08 ENCOUNTER — Ambulatory Visit (HOSPITAL_BASED_OUTPATIENT_CLINIC_OR_DEPARTMENT_OTHER): Payer: Medicare Other | Admitting: Student

## 2023-10-08 DIAGNOSIS — M25562 Pain in left knee: Secondary | ICD-10-CM

## 2023-10-08 NOTE — Progress Notes (Signed)
Chief Complaint: Left knee pain     History of Present Illness:    Darlene Boyer is a 82 y.o. female presenting today for evaluation of left knee pain after an injury 6 days ago.  Patient states that she was walking and her knee twisted when turning.  She does state that she heard and felt a pop.  She was seen the next morning in the emergency department.  She did have x-rays taken and was placed in the hinged knee brace.  Since the injury, she now reports that pain has improved some and is moderate at worst.  She reports only having pain while walking.  She has been taking Tylenol as needed.  Denies any numbness or tingling, catching, locking, or buckling.  No previous history of knee injuries or surgery.   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   Procedure: PACEMAKER IMPLANT;  Surgeon: Duke Salvia, MD;  Location: [facility] INVASIVE CV LAB;  Service: Cardiovascular;  Laterality: N/A;   TUBAL LIGATION     UPPER GASTROINTESTINAL ENDOSCOPY  01/27/2020   Social History   Socioeconomic History   Marital status: Widowed    Spouse name: Not on file   Number of children: 5   Years of education: Not on file   Highest education level: Not on file  Occupational History   Occupation: retired  Tobacco Use   Smoking status: Never   Smokeless tobacco: Current    Types: Snuff   Tobacco comments:    "every now and then"  Vaping Use   Vaping status: Never  Used  Substance and Sexual Activity   Alcohol use: No    Alcohol/week: 0.0 standard drinks of alcohol   Drug use: No   Sexual activity: Not on file  Other Topics Concern   Not on file  Social History Narrative   Lives at home with husband.    Social Determinants of Health   Financial Resource Strain: Not on file  Food Insecurity: Not on file  Transportation Needs: Not on file  Physical Activity: Not on file  Stress: Not on file  Social Connections: Not on file   Family History  Problem Relation Age of Onset   Emphysema Father    Cancer Mother        told lung and breast   Colon cancer Neg Hx    Esophageal cancer Neg Hx    Rectal cancer Neg Hx    Stomach cancer Neg Hx    No Known Allergies Current Outpatient Medications  Medication Sig Dispense Refill   ferrous gluconate (FERGON) 324 MG tablet Take 1 tablet (324 mg total) by mouth daily with breakfast. 90 tablet 0  diltiazem (CARDIZEM CD) 120 MG 24 hr capsule Take 1 capsule (120 mg total) by mouth daily. 90 capsule 3   Dulaglutide (TRULICITY) 0.75 MG/0.5ML SOAJ INJECT 0.75 MG SUBCUTANEOUSLY ONE TIME PER WEEK 6 mL 0   furosemide (LASIX) 20 MG tablet TAKE 1 TABLET BY MOUTH EVERY DAY 90 tablet 3   levothyroxine (SYNTHROID) 88 MCG tablet TAKE 1 TABLET BY MOUTH EVERY DAY 90 tablet 0   loratadine (CLARITIN) 10 MG tablet Take 10 mg by mouth every other day.     losartan (COZAAR) 50 MG tablet TAKE 1 TABLET BY MOUTH EVERY DAY 90 tablet 1   metFORMIN (GLUCOPHAGE) 1000 MG tablet TAKE 1 TABLET BY MOUTH IN THE MORNING, AND 1/2 TABLET IN THE EVENING 135 tablet 1   metoprolol succinate (TOPROL-XL) 100 MG 24 hr tablet TAKE 1 TABLET BY MOUTH EVERY DAY 90 tablet 1   rosuvastatin (CRESTOR) 10 MG tablet TAKE 1 TABLET BY MOUTH EVERY DAY 90 tablet 1   XARELTO 20 MG TABS tablet TAKE 1 TABLET BY MOUTH DAILY WITH SUPPER 90 tablet 1   No current facility-administered medications for this visit.   No results found.  Review of Systems:   A ROS  was performed including pertinent positives and negatives as documented in the HPI.  Physical Exam :   Constitutional: NAD and appears stated age Neurological: Alert and oriented Psych: Appropriate affect and cooperative There were no vitals taken for this visit.   Comprehensive Musculoskeletal Exam:    No obvious deformity noted of left knee.  Tenderness of both medial and lateral joint lines.  Active range of motion from 0 to 110 degrees without any significant crepitus.  Knee flexion and extension strength is 5/5.  No laxity with varus or valgus stress.  Negative Lachman.  Imaging:   Xray review from the ED on 10/02/2020 (left knee): No evidence of acute bony abnormality.  Mild degenerative changes in the medial compartment.  No significant effusion.   I personally reviewed and interpreted the radiographs.   Assessment:   82 y.o. female with acute left knee pain after a twisting injury approximately 1 week ago.  Patient does state that she heard a pop.  Overall today she does report gradual improvement in her symptoms.  Based on the location of her pain I am considering a possible MCL sprain versus meniscal injury, but this could also represent aggravation of mild osteoarthritis.  Given her lack of mechanical symptoms I do not believe that an MRI is necessary with low specificity due to her age.  Will plan to begin treatment conservatively.  Did offer a smaller soft hinged knee brace which she tolerated much better than current one.  Can continue Tylenol for pain.  Did discuss possibility of future injection if indicated or MRI should mechanical symptoms or instability develop.  Will plan to have her return as needed.  Plan :    -Soft hinged knee brace given today -Return to clinic as needed     I personally saw and evaluated the patient, and participated in the management and treatment plan.  Hazle Nordmann, PA-C Orthopedics

## 2023-10-11 DIAGNOSIS — M65331 Trigger finger, right middle finger: Secondary | ICD-10-CM | POA: Diagnosis not present

## 2023-10-15 ENCOUNTER — Other Ambulatory Visit: Payer: Self-pay | Admitting: Orthopedic Surgery

## 2023-10-15 ENCOUNTER — Telehealth: Payer: Self-pay | Admitting: *Deleted

## 2023-10-15 NOTE — Telephone Encounter (Signed)
   Pre-operative Risk Assessment    Patient Name: Darlene Boyer  DOB: 1941-06-23 MRN: [id number]  DATE OF LAST VISIT: 10/30/22 DR. HOCHREIN DATE OF NEXT VISIT: 10/31/23 DR. [facility]    Request for Surgical Clearance    Procedure:   RELEASE A-1 PULLEY RIGHT LONG FINGER  Date of Surgery:  Clearance 11/05/23                                 Surgeon:  DR. [name] Surgeon's Group or Practice Name:  [facility] Phone number:  [phone] Fax number:  [phone] ATTN: RHONDA   Type of Clearance Requested:   - Medical  - Pharmacy:  Hold Rivaroxaban (Xarelto)     Type of Anesthesia:   CHOICE   Additional requests/questions:    Elpidio Anis   10/15/2023, 5:34 PM

## 2023-10-16 NOTE — Telephone Encounter (Signed)
Pt needs an apt with EP APP or MD prior to clearance. She is >1 year overdue.

## 2023-10-16 NOTE — Telephone Encounter (Signed)
Patient with diagnosis of afib on Xarelto for anticoagulation.    Procedure: RELEASE A-1 PULLEY RIGHT LONG FINGER  Date of procedure: 11/05/23   CHA2DS2-VASc Score = 5   This indicates a 7.2% annual risk of stroke. The patient's score is based upon: CHF History: 0 HTN History: 1 Diabetes History: 1 Stroke History: 0 Vascular Disease History: 0 Age Score: 2 Gender Score: 1      CrCl 52.9 ml/min Platelet count 199  Per office protocol, patient can hold Xarelto for 2 days prior to procedure.    **This guidance is not considered finalized until pre-operative APP has relayed final recommendations.**

## 2023-10-16 NOTE — Telephone Encounter (Signed)
   Name: Darlene Boyer  DOB: 1941-09-09  MRN: [id number]  Primary Cardiologist: Rollene Rotunda, MD  Chart reviewed as part of pre-operative protocol coverage. The patient has an upcoming visit scheduled with Dr. Antoine Poche on 10/31/23 at which time clearance can be addressed in case there are any issues that would impact surgical recommendations.  Release A-1 Pulley Right long finger is not scheduled until 11/05/23 as below. I added preop FYI to appointment note so that provider is aware to address at time of outpatient visit.  Per office protocol the cardiology provider should forward their finalized clearance decision and recommendations to the requesting party below.    Per office protocol, patient can hold Xarelto for 2 days prior to procedure. Device clinic to make recommendations if needed.     I will route this message as FYI to requesting party and remove this message from the preop box as separate preop APP input not needed at this time.   Please call with any questions.  Rip Harbour, NP  10/16/2023, 8:26 AM

## 2023-10-22 NOTE — Progress Notes (Signed)
Cardiology Office Note Date:  10/22/2023  Patient ID:  Darlene Boyer, Darlene Boyer [birth year], MRN [id number] PCP:  Jac Canavan, PA-C  Cardiologist:  DR. Antoine Poche EP: Dr. Graciela Husbands   Chief Complaint:   annual visit/device pre-op  History of Present Illness: Darlene Boyer is a 82 y.o. female with history of DM, HTN, arthritis, hypothyroidism, AFib , tachy-brady w/PPM, p.HTN  She saw Dr. Graciela Husbands 05/07/22, AFib at this juncture felt to be permanent, asymptomatic, and feeling well, no changes were made.  She saw Dr. Antoine Poche 10/30/22, again, doing well, denied any symptoms.  Mild anemia without active bleeding noted, planned for an echo early 2024 to f/u on p.pressures  Needs cardiac evaluation for hand surgery: RELEASE A-1 PULLEY RIGHT LONG FINGER  RPH has addressed OAC, to hold 2 days   TODAY Slipped a couple days ago, didn't fall but twisted her knee, is painful, but OK< and able to bear weight. Her had is painful and looking forward to getting that taken care of No CP No palpitations, unaware of any Afib No SOB No near syncope or syncope No bleeding or signs of bleeding  She sees Dr. Antoine Poche later this week  Device information MDT dual chamber PPM, implanted 11/02/2019  Past Medical History:  Diagnosis Date   Arthritis    Atrial fibrillation (HCC)    Diabetes mellitus without complication (HCC)    Hypertension    Obesity    Thyroid disease     Past Surgical History:  Procedure Laterality Date   ABDOMINAL HYSTERECTOMY     partial   ANKLE FRACTURE SURGERY Right    COLONOSCOPY  01/27/2020   EYE SURGERY     Lens implants   FOOT SURGERY Left    Pins in 3 toes   GALLBLADDER SURGERY     PACEMAKER IMPLANT N/A 11/02/2019   Procedure: PACEMAKER IMPLANT;  Surgeon: Duke Salvia, MD;  Location: [facility] INVASIVE CV LAB;  Service: Cardiovascular;  Laterality: N/A;   TUBAL LIGATION     UPPER GASTROINTESTINAL ENDOSCOPY  01/27/2020    Current Outpatient Medications   Medication Sig Dispense Refill   ferrous gluconate (FERGON) 324 MG tablet Take 1 tablet (324 mg total) by mouth daily with breakfast. 90 tablet 0   diltiazem (CARDIZEM CD) 120 MG 24 hr capsule Take 1 capsule (120 mg total) by mouth daily. 90 capsule 3   Dulaglutide (TRULICITY) 0.75 MG/0.5ML SOAJ INJECT 0.75 MG SUBCUTANEOUSLY ONE TIME PER WEEK 6 mL 0   furosemide (LASIX) 20 MG tablet TAKE 1 TABLET BY MOUTH EVERY DAY 90 tablet 3   levothyroxine (SYNTHROID) 88 MCG tablet TAKE 1 TABLET BY MOUTH EVERY DAY 90 tablet 0   loratadine (CLARITIN) 10 MG tablet Take 10 mg by mouth every other day.     losartan (COZAAR) 50 MG tablet TAKE 1 TABLET BY MOUTH EVERY DAY 90 tablet 1   metFORMIN (GLUCOPHAGE) 1000 MG tablet TAKE 1 TABLET BY MOUTH IN THE MORNING, AND 1/2 TABLET IN THE EVENING 135 tablet 1   metoprolol succinate (TOPROL-XL) 100 MG 24 hr tablet TAKE 1 TABLET BY MOUTH EVERY DAY 90 tablet 1   rosuvastatin (CRESTOR) 10 MG tablet TAKE 1 TABLET BY MOUTH EVERY DAY 90 tablet 1   XARELTO 20 MG TABS tablet TAKE 1 TABLET BY MOUTH DAILY WITH SUPPER 90 tablet 1   No current facility-administered medications for this visit.    Allergies:   Patient has no known allergies.   Social History:  The patient  reports that she has never smoked. Her smokeless tobacco use includes snuff. She reports that she does not drink alcohol and does not use drugs.   Family History:  The patient's family history includes Cancer in her mother; Emphysema in her father.  ROS:  Please see the history of present illness.  All other systems are reviewed and otherwise negative.   PHYSICAL EXAM:  VS:  There were no vitals taken for this visit. BMI: There is no height or weight on file to calculate BMI. Well nourished, well developed, in no acute distress  HEENT: normocephalic, atraumatic  Neck: no JVD, carotid bruits or masses Cardiac: irreg-irreg; no significant murmurs, no rubs, or gallops Lungs:  CTA b/l, no wheezing, rhonchi or  rales  Abd: soft, nontender, obese MS: no deformity or atrophy Ext: trace edema  Skin: warm and dry, no rash Neuro:  No gross deficits appreciated Psych: euthymic mood, full affect  PPM site is stable, no tethering or discomfort   EKG:  done today and reviewed by myself SR/  Vpaced  PPM interrogation done today and reviewed by myself Battery and lead measurements are good In AFib current episode started 2:19 this afternoon True burden of late I think is more, with so,e undersensing Burden listed as 76.6% (since June), most of which is in the last 3-4 weeks   02/26/23: TTE 1. Left ventricular ejection fraction, by estimation, is 55 to 60%. Left  ventricular ejection fraction by 3D volume is 54 %. The left ventricle has  normal function. The left ventricle has no regional wall motion  abnormalities. Left ventricular diastolic   function could not be evaluated. The average left ventricular global  longitudinal strain is -15.3 %. The global longitudinal strain is  abnormal.   2. Right ventricular systolic function is moderately reduced. The right  ventricular size is mildly enlarged. There is moderately elevated  pulmonary artery systolic pressure. The estimated right ventricular  systolic pressure is 56.2 mmHg.   3. Left atrial size was moderately dilated.   4. Right atrial size was severely dilated.   5. The mitral valve is normal in structure. Trivial mitral valve  regurgitation. No evidence of mitral stenosis.   6. Tricuspid valve regurgitation is moderate to severe.   7. The aortic valve is normal in structure. Aortic valve regurgitation is  trivial. No aortic stenosis is present.   8. The inferior vena cava is dilated in size with <50% respiratory  variability, suggesting right atrial pressure of 15 mmHg.   9. Compared to echo datd 02/07/21, RVSP has decreased from to      02/07/2021: TTE IMPRESSIONS   1. Left ventricular ejection fraction, by estimation,  is 60 to 65%. The  left ventricle has normal function. The left ventricle has no regional  wall motion abnormalities. There is mild concentric left ventricular  hypertrophy. Left ventricular diastolic  parameters are indeterminate.   2. Right ventricular systolic function is normal. The right ventricular  size is normal. There is moderately elevated pulmonary artery systolic  pressure.   3. Left atrial size was severely dilated.   4. The mitral valve is normal in structure. Trivial mitral valve  regurgitation. No evidence of mitral stenosis.   5. Tricuspid valve regurgitation is moderate.   6. The aortic valve is tricuspid. Aortic valve regurgitation is not  visualized. No aortic stenosis is present.   7. The inferior vena cava is dilated in size with >50% respiratory  variability,  suggesting right atrial pressure of 8 mmHg.   Comparison(s): 08/04/20 EF 60-65%. PA pressure .     11/01/2019: TTE IMPRESSIONS  1. Left ventricular ejection fraction, by visual estimation, is 65 to  70%. The left ventricle has hyperdynamic function. There is moderately  increased left ventricular hypertrophy.   2. Left ventricular diastolic parameters are consistent with Grade II  diastolic dysfunction (pseudonormalization).   3. Global right ventricle has normal systolic function.The right  ventricular size is normal. No increase in right ventricular wall  thickness.   4. Left atrial size was moderately dilated.   5. Right atrial size was normal.   6. Moderate mitral annular calcification.   7. The mitral valve is normal in structure. Mild mitral valve  regurgitation. No evidence of mitral stenosis.   8. The tricuspid valve is normal in structure. Tricuspid valve  regurgitation moderate.   9. The aortic valve is normal in structure. Aortic valve regurgitation is  not visualized. No evidence of aortic valve sclerosis or stenosis.  10. The pulmonic valve was normal in structure. Pulmonic valve   regurgitation is not visualized.  11. Moderately elevated pulmonary artery systolic pressure.  12. The tricuspid regurgitant velocity is 3.56 m/s, and with an assumed  right atrial pressure of 8 mmHg, the estimated right ventricular systolic  pressure is moderately elevated at 58.7 mmHg.  13. The inferior vena cava is normal in size with <50% respiratory  variability, suggesting right atrial pressure of 8 mmHg.   Recent Labs: 03/13/2023: ALT 10; BUN 10; Creatinine, Ser 0.95; Hemoglobin 10.7; Platelets 199; Potassium 3.6; Sodium 144; TSH 0.663  03/13/2023: Chol/HDL Ratio 1.6; Cholesterol, Total 134; HDL 84; LDL Chol Calc (NIH) 39; Triglycerides 47   CrCl cannot be calculated (Patient's most recent lab result is older than the maximum 21 days allowed.).   Wt Readings from Last 3 Encounters:  10/03/23 218 lb (98.9 kg)  03/13/23 224 lb 3.2 oz (101.7 kg)  10/30/22 230 lb (104.3 kg)     Other studies reviewed: Additional studies/records reviewed today include: summarized above  ASSESSMENT AND PLAN:  1. PPM     Intact function     no programming changes made    2. Permanent Afib     CHA2DS2Vasc is 5, on xarelto, appropriately dosed     At least 75 % burden      Asymptomatic, rate controlled Had a period of SR it appears by her PPM check, though mostly back in AFib this last month again Follow burden Without symptoms, do not feel strongly about AAD needs   3. HTN     No changes today  4. Secondary hypercoagulable state  5.  P.HTN Sees Dr. Antoine Poche later this week   Disposition: remotes as usual, in clinic with EP in a year again, sooner if needed   Current medicines are reviewed at length with the patient today.  The patient did not have any concerns regarding medicines.  Norma Fredrickson, PA-C 10/22/2023 12:48 PM     [facility] [address] [place] [address] [phone] (office)  [phone] (fax)

## 2023-10-23 ENCOUNTER — Telehealth: Payer: Self-pay

## 2023-10-23 NOTE — Telephone Encounter (Signed)
Transition Care Management Unsuccessful Follow-up Telephone Call  Date of discharge and from where:  [facility] 11/7  Attempts:  1st Attempt  Reason for unsuccessful TCM follow-up call:  No answer/busy   Lenard Forth Harvey  [facility], [facility] Guide, Phone: [phone] Website: [email or web address]

## 2023-10-25 ENCOUNTER — Telehealth: Payer: Self-pay

## 2023-10-25 NOTE — Telephone Encounter (Signed)
Transition Care Management Unsuccessful Follow-up Telephone Call  Date of discharge and from where:  [facility] 11/7  Attempts:  2nd Attempt  Reason for unsuccessful TCM follow-up call:  No answer/busy      Lenard Forth Bingham  [facility], [facility] Guide, Phone: [phone] Website: [email or web address]

## 2023-10-28 ENCOUNTER — Encounter: Payer: Self-pay | Admitting: Physician Assistant

## 2023-10-28 ENCOUNTER — Ambulatory Visit: Payer: Medicare Other | Attending: Physician Assistant | Admitting: Physician Assistant

## 2023-10-28 VITALS — BP 138/78 | HR 83 | Ht 63.0 in | Wt 225.0 lb

## 2023-10-28 DIAGNOSIS — Z950 Presence of cardiac pacemaker: Secondary | ICD-10-CM

## 2023-10-28 DIAGNOSIS — I4811 Longstanding persistent atrial fibrillation: Secondary | ICD-10-CM

## 2023-10-28 DIAGNOSIS — D6869 Other thrombophilia: Secondary | ICD-10-CM | POA: Diagnosis not present

## 2023-10-28 DIAGNOSIS — I10 Essential (primary) hypertension: Secondary | ICD-10-CM

## 2023-10-28 LAB — CUP PACEART INCLINIC DEVICE CHECK
Date Time Interrogation Session: 20241202153126
Implantable Lead Connection Status: 753985
Implantable Lead Connection Status: 753985
Implantable Lead Implant Date: 20201207
Implantable Lead Location: 753859
Implantable Lead Location: 753860
Implantable Lead Model: 4076
Implantable Lead Model: 4076
Implantable Pulse Generator Implant Date: 20201207

## 2023-10-28 NOTE — Progress Notes (Addendum)
Cardiology Office Note:   Date:  10/31/2023  ID:  Darlene Boyer, DOB [birth year], MRN [id number] PCP: Genia Del  [facility] Providers Cardiologist:  Rollene Rotunda, MD Electrophysiologist:  Sherryl Manges, MD {  History of Present Illness:   Darlene Boyer is a 82 y.o. female who presents for evaluation of atrial fibrillation.  She had a pacemaker in Dec 2020.  Since I last saw her she has done well.  She did injure her knee a little bit and is using walking stick.  She can go up and down stairs and not have any shortness of breath.  She might be a little tired.  She is having trigger finger surgery upcoming.  She has had no chest pressure, neck or arm discomfort.  She does not feel her atrial fibrillation which is thought to be more persistent or permanent and at least 75% burden.  She had normal pacemaker function recently.  She tolerates her anticoagulation.  She has had no PND or orthopnea.  She has had no new edema.  She does have elevated pulmonary pressures but she is relatively asymptomatic with this.  She has moderately reduced right systolic function and her pulmonary pressures measured to be 56.2 mm.  She had an echo earlier this year.    ROS: As stated in the HPI and negative for all other systems.  Studies Reviewed:    EKG:  NA  Risk Assessment/Calculations:    CHA2DS2-VASc Score = 5   This indicates a 7.2% annual risk of stroke. The patient's score is based upon: CHF History: 0 HTN History: 1 Diabetes History: 1 Stroke History: 0 Vascular Disease History: 0 Age Score: 2 Gender Score: 1   Physical Exam:   VS:  BP (!) 140/86 (BP Location: Left Arm, Patient Position: Sitting, Cuff Size: Large)   Pulse 86   Ht 5' 3.5" (1.613 m)   Wt 228 lb (103.4 kg)   BMI 39.75 kg/m    Wt Readings from Last 3 Encounters:  10/31/23 228 lb (103.4 kg)  10/28/23 225 lb (102.1 kg)  10/03/23 218 lb (98.9 kg)     GEN: Well nourished, well developed in no  acute distress NECK: No JVD; No carotid bruits CARDIAC: RRR, no murmurs, rubs, gallops RESPIRATORY:  Clear to auscultation without rales, wheezing or rhonchi  ABDOMEN: Soft, non-tender, non-distended EXTREMITIES:  No edema; No deformity   ASSESSMENT AND PLAN:   ATRIAL FIB/FLUTTER:   Darlene Boyer has a CHA2DS2 - VASc score of 6 .  She tolerates anticoagulation.  No change in therapy.   HTN:  The blood pressure is upper limits of normal and typically controlled.  No change in therapy.   DM:  A1C is 7.1 which was down from 7.8.  This is managed per Jac Canavan, PA-C   BRADYCARDIA:    She is up-to-date with pacemaker follow-up and saw the EP clinic yesterday.   PULMONARY HTN:    She is actually asymptomatic with this.  We are managing this conservatively.  PREOP: The patient is at acceptable risk for her hand surgery.  She has no high risk symptoms or findings.  She is not going for high risk surgery.  She has moderate functional level.  According to ACC/AHA guidelines no further testing is indicated.  She can hold her Xarelto as needed and I suggested 2 days prior to surgery.     Follow up with me in 12 months.  Signed, Rollene Rotunda, MD

## 2023-10-28 NOTE — Patient Instructions (Signed)
Medication Instructions:    Your physician recommends that you continue on your current medications as directed. Please refer to the Current Medication list given to you today.  *If you need a refill on your cardiac medications before your next appointment, please call your pharmacy*   Lab Work:  NONE ORDERED  TODAY   If you have labs (blood work) drawn today and your tests are completely normal, you will receive your results only by: MyChart Message (if you have MyChart) OR A paper copy in the mail If you have any lab test that is abnormal or we need to change your treatment, we will call you to review the results.   Testing/Procedures: NONE ORDERED  TODAY     Follow-Up: At [facility], you and your health needs are our priority.  As part of our continuing mission to provide you with exceptional heart care, we have created designated Provider Care Teams.  These Care Teams include your primary Cardiologist (physician) and Advanced Practice Providers (APPs -  Physician Assistants and Nurse Practitioners) who all work together to provide you with the care you need, when you need it.  We recommend signing up for the patient portal called "MyChart".  Sign up information is provided on this After Visit Summary.  MyChart is used to connect with patients for Virtual Visits (Telemedicine).  Patients are able to view lab/test results, encounter notes, upcoming appointments, etc.  Non-urgent messages can be sent to your provider as well.   To learn more about what you can do with MyChart, go to [email or web address].    Your next appointment:   1 year(s)  Provider:   You may see Steven Klein, MD or one of the following Advanced Practice Providers on your designated Care Team:   Renee Ursuy, PA-C  Other Instructions  

## 2023-10-29 NOTE — Anesthesia Preprocedure Evaluation (Addendum)
Anesthesia Evaluation  Patient identified by MRN, date of birth, ID band Patient awake    Reviewed: Allergy & Precautions, H&P , NPO status , Patient's Chart, lab work & pertinent test results  Airway Mallampati: II  TM Distance: >3 FB Neck ROM: Full    Dental no notable dental hx. (+) Poor Dentition, Missing, Partial Lower, Partial Upper, Dental Advisory Given   Pulmonary neg pulmonary ROS   Pulmonary exam normal breath sounds clear to auscultation       Cardiovascular Exercise Tolerance: Good hypertension, Pt. on medications and Pt. on home beta blockers pulmonary hypertensionnegative cardio ROS Normal cardiovascular exam+ dysrhythmias Atrial Fibrillation + pacemaker  Rhythm:Regular Rate:Normal     Neuro/Psych negative neurological ROS  negative psych ROS   GI/Hepatic negative GI ROS, Neg liver ROS,,,  Endo/Other  negative endocrine ROSdiabetes, Oral Hypoglycemic AgentsHypothyroidism  Class 3 obesity  Renal/GU negative Renal ROS  negative genitourinary   Musculoskeletal negative musculoskeletal ROS (+) Arthritis ,    Abdominal   Peds negative pediatric ROS (+)  Hematology negative hematology ROS (+) Blood dyscrasia (Xarelto)   Anesthesia Other Findings Trigger right long finger  Reproductive/Obstetrics negative OB ROS                             Anesthesia Physical Anesthesia Plan  ASA: 3  Anesthesia Plan: Bier Block and MAC and Bier Block-LIDOCAINE ONLY   Post-op Pain Management: Minimal or no pain anticipated, Tylenol PO (pre-op)* and Celebrex PO (pre-op)*   Induction: Intravenous  PONV Risk Score and Plan: 2 and Propofol infusion  Airway Management Planned: Natural Airway and Simple Face Mask  Additional Equipment: None  Intra-op Plan:   Post-operative Plan: Extubation in OR  Informed Consent: I have reviewed the patients History and Physical, chart, labs and discussed  the procedure including the risks, benefits and alternatives for the proposed anesthesia with the patient or authorized representative who has indicated his/her understanding and acceptance.       Plan Discussed with: CRNA, Surgeon and Anesthesiologist  Anesthesia Plan Comments: ( )        Anesthesia Quick Evaluation

## 2023-10-30 ENCOUNTER — Ambulatory Visit (INDEPENDENT_AMBULATORY_CARE_PROVIDER_SITE_OTHER): Payer: Medicare Other

## 2023-10-30 ENCOUNTER — Encounter: Payer: Self-pay | Admitting: Internal Medicine

## 2023-10-30 DIAGNOSIS — I440 Atrioventricular block, first degree: Secondary | ICD-10-CM

## 2023-10-30 LAB — CUP PACEART REMOTE DEVICE CHECK
Battery Remaining Longevity: 116 mo
Battery Voltage: 3 V
Brady Statistic RA Percent Paced: 1.02 %
Brady Statistic RV Percent Paced: 92.96 %
Date Time Interrogation Session: 20241204040443
Implantable Lead Connection Status: 753985
Implantable Lead Connection Status: 753985
Implantable Lead Implant Date: 20201207
Implantable Lead Location: 753859
Implantable Lead Location: 753860
Implantable Lead Model: 4076
Implantable Lead Model: 4076
Implantable Pulse Generator Implant Date: 20201207
Lead Channel Impedance Value: 304 Ohm
Lead Channel Impedance Value: 380 Ohm
Lead Channel Impedance Value: 418 Ohm
Lead Channel Impedance Value: 475 Ohm
Lead Channel Pacing Threshold Amplitude: 0.5 V
Lead Channel Pacing Threshold Amplitude: 0.625 V
Lead Channel Pacing Threshold Pulse Width: 0.4 ms
Lead Channel Pacing Threshold Pulse Width: 0.4 ms
Lead Channel Sensing Intrinsic Amplitude: 1.25 mV
Lead Channel Sensing Intrinsic Amplitude: 1.25 mV
Lead Channel Sensing Intrinsic Amplitude: 14.75 mV
Lead Channel Sensing Intrinsic Amplitude: 14.75 mV
Lead Channel Setting Pacing Amplitude: 1.5 V
Lead Channel Setting Pacing Amplitude: 2.5 V
Lead Channel Setting Pacing Pulse Width: 0.4 ms
Lead Channel Setting Sensing Sensitivity: 0.9 mV
Zone Setting Status: 755011
Zone Setting Status: 755011

## 2023-10-30 NOTE — Progress Notes (Signed)
PERIOPERATIVE PRESCRIPTION FOR IMPLANTED CARDIAC DEVICE PROGRAMMING  Patient Information: Name:  Darlene Boyer  DOB:  [birth year]  MRN:  [id number]    Planned Procedure:  release right long trigger finger  Surgeon:  Dr Merlyn Lot  Date of Procedure:  11-05-23  Cautery will be used.  Position during surgery:  supine   Please send documentation back to:  [facility] (Fax # [phone])  Device Information:  Clinic EP Physician:  Sherryl Manges, MD   Device Type:  Pacemaker Manufacturer and Phone #:  [employer]: [id number] Pacemaker Dependent?:  No. Date of Last Device Check:  10/30/23 Normal Device Function?:  Yes.    Electrophysiologist's Recommendations:  Have magnet available. Provide continuous ECG monitoring when magnet is used or reprogramming is to be performed.  Procedure may interfere with device function.  Magnet should be placed over device during procedure.  Per Device Clinic Standing Orders, Lenor Coffin, RN  9:06 AM 10/30/2023

## 2023-10-31 ENCOUNTER — Encounter (HOSPITAL_BASED_OUTPATIENT_CLINIC_OR_DEPARTMENT_OTHER): Payer: Self-pay | Admitting: Orthopedic Surgery

## 2023-10-31 ENCOUNTER — Ambulatory Visit: Payer: Medicare Other | Attending: Cardiology | Admitting: Cardiology

## 2023-10-31 ENCOUNTER — Other Ambulatory Visit: Payer: Self-pay

## 2023-10-31 ENCOUNTER — Encounter: Payer: Self-pay | Admitting: Cardiology

## 2023-10-31 ENCOUNTER — Telehealth: Payer: Self-pay | Admitting: Cardiology

## 2023-10-31 VITALS — BP 140/86 | HR 86 | Ht 63.5 in | Wt 228.0 lb

## 2023-10-31 DIAGNOSIS — Z01810 Encounter for preprocedural cardiovascular examination: Secondary | ICD-10-CM

## 2023-10-31 DIAGNOSIS — E118 Type 2 diabetes mellitus with unspecified complications: Secondary | ICD-10-CM | POA: Diagnosis not present

## 2023-10-31 DIAGNOSIS — I10 Essential (primary) hypertension: Secondary | ICD-10-CM | POA: Diagnosis not present

## 2023-10-31 DIAGNOSIS — I272 Pulmonary hypertension, unspecified: Secondary | ICD-10-CM

## 2023-10-31 DIAGNOSIS — R001 Bradycardia, unspecified: Secondary | ICD-10-CM | POA: Diagnosis not present

## 2023-10-31 DIAGNOSIS — I484 Atypical atrial flutter: Secondary | ICD-10-CM

## 2023-10-31 MED ORDER — RIVAROXABAN 20 MG PO TABS: 20.0000 mg | ORAL_TABLET | Freq: Every day | ORAL | 1 refills | Status: DC

## 2023-10-31 NOTE — Progress Notes (Signed)
Chart reviewed with Dr. Bradley Ferris. Cardiac clearance provided 10/31/2023. Per Dr. Bradley Ferris, ok to proceed with planned surgery with Dr. Merlyn Lot at [facility].  [employer] has been notified (Ivan-rep.) that the patient has a pacemaker in place and wanted to make him aware of pacemaker programming instructions.

## 2023-10-31 NOTE — Patient Instructions (Signed)
Medication Instructions:  No changes.  *If you need a refill on your cardiac medications before your next appointment, please call your pharmacy*   Follow-Up: At [facility], you and your health needs are our priority.  As part of our continuing mission to provide you with exceptional heart care, we have created designated Provider Care Teams.  These Care Teams include your primary Cardiologist (physician) and Advanced Practice Providers (APPs -  Physician Assistants and Nurse Practitioners) who all work together to provide you with the care you need, when you need it.  12 month(s)  Provider:   Rollene Rotunda, MD

## 2023-10-31 NOTE — Telephone Encounter (Signed)
Per Dawn in OV Note Doctor put pt was on coumadin but pt is on Xarelto. Dawn needs clarification on this.

## 2023-10-31 NOTE — Telephone Encounter (Signed)
I will let the pre op APP and Dr. Antoine Poche know the ov notes will need to be changed to reflect the correct blood thinner to be held.   Med list reflects Xarelto.

## 2023-11-04 ENCOUNTER — Encounter (HOSPITAL_BASED_OUTPATIENT_CLINIC_OR_DEPARTMENT_OTHER)
Admission: RE | Admit: 2023-11-04 | Discharge: 2023-11-04 | Disposition: A | Discharge status: Home / Self Care | Payer: Medicare Other | Source: Ambulatory Visit | Attending: Orthopedic Surgery | Admitting: Orthopedic Surgery

## 2023-11-04 DIAGNOSIS — E66813 Obesity, class 3: Secondary | ICD-10-CM | POA: Diagnosis not present

## 2023-11-04 DIAGNOSIS — M65331 Trigger finger, right middle finger: Secondary | ICD-10-CM | POA: Diagnosis not present

## 2023-11-04 DIAGNOSIS — E119 Type 2 diabetes mellitus without complications: Secondary | ICD-10-CM | POA: Diagnosis not present

## 2023-11-04 DIAGNOSIS — Z7901 Long term (current) use of anticoagulants: Secondary | ICD-10-CM | POA: Diagnosis not present

## 2023-11-04 DIAGNOSIS — Z79899 Other long term (current) drug therapy: Secondary | ICD-10-CM | POA: Diagnosis not present

## 2023-11-04 DIAGNOSIS — E039 Hypothyroidism, unspecified: Secondary | ICD-10-CM | POA: Diagnosis not present

## 2023-11-04 DIAGNOSIS — M1990 Unspecified osteoarthritis, unspecified site: Secondary | ICD-10-CM | POA: Diagnosis not present

## 2023-11-04 DIAGNOSIS — I10 Essential (primary) hypertension: Secondary | ICD-10-CM | POA: Diagnosis not present

## 2023-11-04 DIAGNOSIS — I272 Pulmonary hypertension, unspecified: Secondary | ICD-10-CM | POA: Diagnosis not present

## 2023-11-04 DIAGNOSIS — Z7984 Long term (current) use of oral hypoglycemic drugs: Secondary | ICD-10-CM | POA: Diagnosis not present

## 2023-11-04 DIAGNOSIS — Z6838 Body mass index (BMI) 38.0-38.9, adult: Secondary | ICD-10-CM | POA: Diagnosis not present

## 2023-11-04 DIAGNOSIS — Z950 Presence of cardiac pacemaker: Secondary | ICD-10-CM | POA: Diagnosis not present

## 2023-11-04 DIAGNOSIS — Z7985 Long-term (current) use of injectable non-insulin antidiabetic drugs: Secondary | ICD-10-CM | POA: Diagnosis not present

## 2023-11-04 DIAGNOSIS — I4891 Unspecified atrial fibrillation: Secondary | ICD-10-CM | POA: Diagnosis not present

## 2023-11-04 LAB — BASIC METABOLIC PANEL
Anion gap: 11 (ref 5–15)
BUN: 11 mg/dL (ref 8–23)
CO2: 28 mmol/L (ref 22–32)
Calcium: 9.3 mg/dL (ref 8.9–10.3)
Chloride: 103 mmol/L (ref 98–111)
Creatinine, Ser: 1.23 mg/dL — ABNORMAL HIGH (ref 0.44–1.00)
GFR, Estimated: 44 mL/min — ABNORMAL LOW (ref >60)
Glucose, Bld: 142 mg/dL — ABNORMAL HIGH (ref 70–99)
Potassium: 3.1 mmol/L — ABNORMAL LOW (ref 3.5–5.1)
Sodium: 142 mmol/L (ref 135–145)

## 2023-11-04 NOTE — Progress Notes (Signed)

## 2023-11-04 NOTE — Telephone Encounter (Signed)
I will update the requesting office.    Rollene Rotunda, MD  You; Betsey Holiday, Enedina Finner, RN3 days ago    Correction made   You routed conversation to [facility]; Rollene Rotunda, MD4 days ago   You4 days ago    I will let the pre op APP and Dr. Antoine Poche know the ov notes will need to be changed to reflect the correct blood thinner to be held.    Med list reflects Xarelto.       Note   Andreas Blower routed conversation to [employer] days ago   Delane Ginger, [place] days ago   BG Per Dawn in OV Note Doctor put pt was on coumadin but pt is on Xarelto. Dawn needs clarification on this.      Note   [facility] [phone]  Andreas Blower

## 2023-11-05 ENCOUNTER — Ambulatory Visit (HOSPITAL_BASED_OUTPATIENT_CLINIC_OR_DEPARTMENT_OTHER): Payer: Medicare Other | Admitting: Anesthesiology

## 2023-11-05 ENCOUNTER — Ambulatory Visit (HOSPITAL_BASED_OUTPATIENT_CLINIC_OR_DEPARTMENT_OTHER)
Admission: RE | Admit: 2023-11-05 | Discharge: 2023-11-05 | Disposition: A | Discharge status: Home / Self Care | Payer: Medicare Other | Attending: Orthopedic Surgery | Admitting: Orthopedic Surgery

## 2023-11-05 ENCOUNTER — Encounter (HOSPITAL_BASED_OUTPATIENT_CLINIC_OR_DEPARTMENT_OTHER): Payer: Self-pay | Admitting: Orthopedic Surgery

## 2023-11-05 ENCOUNTER — Encounter (HOSPITAL_BASED_OUTPATIENT_CLINIC_OR_DEPARTMENT_OTHER)
Admission: RE | Disposition: A | Discharge status: Home / Self Care | Payer: Self-pay | Source: Home / Self Care | Attending: Orthopedic Surgery

## 2023-11-05 ENCOUNTER — Other Ambulatory Visit: Payer: Self-pay

## 2023-11-05 ENCOUNTER — Other Ambulatory Visit (HOSPITAL_COMMUNITY): Payer: Self-pay

## 2023-11-05 DIAGNOSIS — Z6838 Body mass index (BMI) 38.0-38.9, adult: Secondary | ICD-10-CM | POA: Insufficient documentation

## 2023-11-05 DIAGNOSIS — Z950 Presence of cardiac pacemaker: Secondary | ICD-10-CM | POA: Diagnosis not present

## 2023-11-05 DIAGNOSIS — E039 Hypothyroidism, unspecified: Secondary | ICD-10-CM | POA: Diagnosis not present

## 2023-11-05 DIAGNOSIS — M65331 Trigger finger, right middle finger: Secondary | ICD-10-CM | POA: Diagnosis not present

## 2023-11-05 DIAGNOSIS — E66813 Obesity, class 3: Secondary | ICD-10-CM | POA: Insufficient documentation

## 2023-11-05 DIAGNOSIS — M1990 Unspecified osteoarthritis, unspecified site: Secondary | ICD-10-CM | POA: Insufficient documentation

## 2023-11-05 DIAGNOSIS — Z79899 Other long term (current) drug therapy: Secondary | ICD-10-CM | POA: Diagnosis not present

## 2023-11-05 DIAGNOSIS — I272 Pulmonary hypertension, unspecified: Secondary | ICD-10-CM | POA: Insufficient documentation

## 2023-11-05 DIAGNOSIS — Z7984 Long term (current) use of oral hypoglycemic drugs: Secondary | ICD-10-CM | POA: Diagnosis not present

## 2023-11-05 DIAGNOSIS — I4891 Unspecified atrial fibrillation: Secondary | ICD-10-CM | POA: Insufficient documentation

## 2023-11-05 DIAGNOSIS — Z7985 Long-term (current) use of injectable non-insulin antidiabetic drugs: Secondary | ICD-10-CM | POA: Diagnosis not present

## 2023-11-05 DIAGNOSIS — I4892 Unspecified atrial flutter: Secondary | ICD-10-CM | POA: Diagnosis not present

## 2023-11-05 DIAGNOSIS — Z7901 Long term (current) use of anticoagulants: Secondary | ICD-10-CM | POA: Diagnosis not present

## 2023-11-05 DIAGNOSIS — E119 Type 2 diabetes mellitus without complications: Secondary | ICD-10-CM | POA: Insufficient documentation

## 2023-11-05 DIAGNOSIS — Z01818 Encounter for other preprocedural examination: Secondary | ICD-10-CM

## 2023-11-05 DIAGNOSIS — I10 Essential (primary) hypertension: Secondary | ICD-10-CM | POA: Diagnosis not present

## 2023-11-05 HISTORY — PX: TRIGGER FINGER RELEASE: SHX641

## 2023-11-05 LAB — GLUCOSE, CAPILLARY: Glucose-Capillary: 103 mg/dL — ABNORMAL HIGH (ref 70–99)

## 2023-11-05 SURGERY — RELEASE, A1 PULLEY, FOR TRIGGER FINGER
Anesthesia: Monitor Anesthesia Care | Site: Hand | Laterality: Right

## 2023-11-05 MED ORDER — ONDANSETRON HCL 4 MG/2ML IJ SOLN
INTRAMUSCULAR | Status: DC | PRN
  Administered 2023-11-05: 4 mg via INTRAVENOUS

## 2023-11-05 MED ORDER — SODIUM CHLORIDE 0.9 % IV SOLN: INTRAVENOUS | Status: DC | PRN

## 2023-11-05 MED ORDER — CEFAZOLIN SODIUM-DEXTROSE 2-4 GM/100ML-% IV SOLN
INTRAVENOUS | Status: AC
  Filled 2023-11-05: qty 100

## 2023-11-05 MED ORDER — PROPOFOL 10 MG/ML IV BOLUS
INTRAVENOUS | Status: DC | PRN
  Administered 2023-11-05 (×2): 20 mg via INTRAVENOUS

## 2023-11-05 MED ORDER — OXYCODONE HCL 5 MG/5ML PO SOLN: 5.0000 mg | Freq: Once | ORAL | Status: DC | PRN

## 2023-11-05 MED ORDER — ACETAMINOPHEN 160 MG/5ML PO SOLN: 325.0000 mg | ORAL | Status: DC | PRN

## 2023-11-05 MED ORDER — ACETAMINOPHEN 325 MG PO TABS: 325.0000 mg | ORAL_TABLET | ORAL | Status: DC | PRN

## 2023-11-05 MED ORDER — ONDANSETRON HCL 4 MG/2ML IJ SOLN: 4.0000 mg | Freq: Once | INTRAMUSCULAR | Status: DC | PRN

## 2023-11-05 MED ORDER — CEFAZOLIN SODIUM-DEXTROSE 2-4 GM/100ML-% IV SOLN
2.0000 g | INTRAVENOUS | Status: AC
  Administered 2023-11-05: 2 g via INTRAVENOUS

## 2023-11-05 MED ORDER — PROPOFOL 500 MG/50ML IV EMUL
INTRAVENOUS | Status: DC | PRN
  Administered 2023-11-05: 50 ug/kg/min via INTRAVENOUS

## 2023-11-05 MED ORDER — FENTANYL CITRATE (PF) 100 MCG/2ML IJ SOLN: 25.0000 ug | INTRAMUSCULAR | Status: DC | PRN

## 2023-11-05 MED ORDER — MEPERIDINE HCL 25 MG/ML IJ SOLN: 6.2500 mg | INTRAMUSCULAR | Status: DC | PRN

## 2023-11-05 MED ORDER — HYDROCODONE-ACETAMINOPHEN 5-325 MG PO TABS
1.0000 | ORAL_TABLET | Freq: Four times a day (QID) | ORAL | 0 refills | Status: DC | PRN
  Filled 2023-11-05: qty 10, 2d supply, fill #0

## 2023-11-05 MED ORDER — LIDOCAINE HCL (PF) 0.5 % IJ SOLN
INTRAMUSCULAR | Status: DC | PRN
  Administered 2023-11-05: 30 mL via INTRAVENOUS

## 2023-11-05 MED ORDER — LACTATED RINGERS IV SOLN: INTRAVENOUS | Status: DC

## 2023-11-05 MED ORDER — BUPIVACAINE HCL (PF) 0.25 % IJ SOLN
INTRAMUSCULAR | Status: DC | PRN
  Administered 2023-11-05: 9 mL

## 2023-11-05 MED ORDER — OXYCODONE HCL 5 MG PO TABS: 5.0000 mg | ORAL_TABLET | Freq: Once | ORAL | Status: DC | PRN

## 2023-11-05 MED ORDER — 0.9 % SODIUM CHLORIDE (POUR BTL) OPTIME
TOPICAL | Status: DC | PRN
  Administered 2023-11-05: 100 mL

## 2023-11-05 SURGICAL SUPPLY — 26 items
BLADE SURG 15 STRL LF DISP TIS (BLADE) ×2 IMPLANT
BNDG COHESIVE 2X5 TAN ST LF (GAUZE/BANDAGES/DRESSINGS) ×1 IMPLANT
BNDG ESMARK 4X9 LF (GAUZE/BANDAGES/DRESSINGS) IMPLANT
CHLORAPREP W/TINT 26 (MISCELLANEOUS) ×1 IMPLANT
CORD BIPOLAR FORCEPS 12FT (ELECTRODE) ×1 IMPLANT
COVER BACK TABLE 60X90IN (DRAPES) ×1 IMPLANT
COVER MAYO STAND STRL (DRAPES) ×1 IMPLANT
CUFF TOURN SGL QUICK 18X4 (TOURNIQUET CUFF) ×1 IMPLANT
DRAPE EXTREMITY T 121X128X90 (DISPOSABLE) ×1 IMPLANT
DRAPE SURG 17X23 STRL (DRAPES) ×1 IMPLANT
GAUZE SPONGE 4X4 12PLY STRL (GAUZE/BANDAGES/DRESSINGS) ×1 IMPLANT
GAUZE XEROFORM 1X8 LF (GAUZE/BANDAGES/DRESSINGS) ×1 IMPLANT
GLOVE BIO SURGEON STRL SZ7.5 (GLOVE) ×1 IMPLANT
GLOVE BIOGEL PI IND STRL 8 (GLOVE) ×1 IMPLANT
GOWN STRL REUS W/ TWL LRG LVL3 (GOWN DISPOSABLE) ×1 IMPLANT
GOWN STRL REUS W/TWL XL LVL3 (GOWN DISPOSABLE) ×1 IMPLANT
NDL HYPO 25X1 1.5 SAFETY (NEEDLE) ×1 IMPLANT
NEEDLE HYPO 25X1 1.5 SAFETY (NEEDLE) ×1 IMPLANT
NS IRRIG 1000ML POUR BTL (IV SOLUTION) ×1 IMPLANT
PACK BASIN DAY SURGERY FS (CUSTOM PROCEDURE TRAY) ×1 IMPLANT
STOCKINETTE 4X48 STRL (DRAPES) ×1 IMPLANT
SUT ETHILON 4 0 PS 2 18 (SUTURE) ×1 IMPLANT
SYR BULB EAR ULCER 3OZ GRN STR (SYRINGE) ×1 IMPLANT
SYR CONTROL 10ML LL (SYRINGE) ×1 IMPLANT
TOWEL GREEN STERILE FF (TOWEL DISPOSABLE) ×2 IMPLANT
UNDERPAD 30X36 HEAVY ABSORB (UNDERPADS AND DIAPERS) ×1 IMPLANT

## 2023-11-05 NOTE — Discharge Instructions (Addendum)
[facility] Center Instructions °Hand Surgery ° °Wound Care: °Keep your hand elevated above the level of your heart.  Do not allow it to dangle by your side.  Keep the dressing dry and do not remove it unless your doctor advises you to do so.  He will usually change it at the time of your post-op visit.  Moving your fingers is advised to stimulate circulation but will depend on the site of your surgery.  If you have a splint applied, your doctor will advise you regarding movement. ° °Activity: °Do not drive or operate machinery today.  Rest today and then you may return to your normal activity and work as indicated by your physician. ° °Diet:  °Drink liquids today or eat a light diet.  You may resume a regular diet tomorrow.   ° °General expectations: °Pain for two to three days. °Fingers may become slightly swollen. ° °Call your doctor if any of the following occur: °Severe pain not relieved by pain medication. °Elevated temperature. °Dressing soaked with blood. °Inability to move fingers. °White or bluish color to fingers. ° ° °Post Anesthesia Home Care Instructions ° °Activity: °Get plenty of rest for the remainder of the day. A responsible individual must stay with you for 24 hours following the procedure.  °For the next 24 hours, DO NOT: °-Drive a car °-Operate machinery °-Drink alcoholic beverages °-Take any medication unless instructed by your physician °-Make any legal decisions or sign important papers. ° °Meals: °Start with liquid foods such as gelatin or soup. Progress to regular foods as tolerated. Avoid greasy, spicy, heavy foods. If nausea and/or vomiting occur, drink only clear liquids until the nausea and/or vomiting subsides. Call your physician if vomiting continues. ° °Special Instructions/Symptoms: °Your throat may feel dry or sore from the anesthesia or the breathing tube placed in your throat during surgery. If this causes discomfort, gargle with warm salt water. The discomfort should disappear within  24 hours. ° °If you had a scopolamine patch placed behind your ear for the management of post- operative nausea and/or vomiting: ° °1. The medication in the patch is effective for 72 hours, after which it should be removed.  Wrap patch in a tissue and discard in the trash. Wash hands thoroughly with soap and water. °2. You may remove the patch earlier than 72 hours if you experience unpleasant side effects which may include dry mouth, dizziness or visual disturbances. °3. Avoid touching the patch. Wash your hands with soap and water after contact with the patch. °  ° ° °

## 2023-11-05 NOTE — Op Note (Signed)
11/05/2023 [facility]  Operative Note  PREOPERATIVE DIAGNOSIS: Trigger right long finger  POSTOPERATIVE DIAGNOSIS:  Trigger right long finger  PROCEDURE: Procedure(s): RELEASE TRIGGER FINGER/A-1 PULLEY   SURGEON:  [name], MD  ASSISTANT:  none.  ANESTHESIA:  Bier block with sedation.  IV FLUIDS:  Per anesthesia flow sheet.  ESTIMATED BLOOD LOSS:  Minimal.  COMPLICATIONS:  None.  SPECIMENS:  None.  TOURNIQUET TIME:  Total Tourniquet Time Documented: Forearm (Right) - 16 minutes Total: Forearm (Right) - 16 minutes   DISPOSITION:  Stable to PACU.  LOCATION: [facility]  INDICATIONS: Darlene Boyer is a 82 y.o. female with triggering of the long finger.  This has been injected without lasting resolution.  She wishes to proceed with surgical trigger release.  Risks, benefits and alternatives of surgery were discussed including the risk of blood loss, infection, damage to nerves, vessels, tendons, ligaments, bone, failure of surgery, need for additional surgery, complications with wound healing, continued pain, continued triggering and need for repeat surgery.  She voiced understanding of these risks and elected to proceed.  OPERATIVE COURSE:  After being identified preoperatively by myself, the patient and I agreed upon the procedure and site of procedure.  The surgical site was marked. Surgical consent had been signed. She was given IV Ancef as preoperative antibiotic prophylaxis. She was transported to the operating room and placed on the operating room table in supine position with the Right upper extremity on an arm board. Bier block anesthesia was induced by the anesthesiologist.  The Right upper extremity was prepped and draped in normal sterile orthopedic fashion. A surgical pause was performed between surgeons, anesthesia, and operating room staff, and all were in agreement as to the patient, procedure, and site of procedure.  Tourniquet at  the proximal aspect of the forearm had been inflated for the Bier block.  An incision was made at the volar aspect of the MP joint of the long finger.  This was carried into the subcutaneous tissues by spreading technique.  Bipolar electrocautery was used to obtain hemostasis.  The radial and ulnar digital nerves were protected throughout the case. The flexor sheath was identified.  The A1 pulley was identified and sharply incised.  It was released in its entirety.  The proximal 1-2 mm of the A2 pulley was vented to allow better excursion of the tendons.  The finger was placed through a range of motion and there was noted to be no catching.  The tendons were brought through the wound and any adherences released.  The wound was then copiously irrigated with sterile saline. It was closed with 4-0 nylon in a horizontal mattress fashion.  It was injected with 0.25% plain Marcaine to aid in postoperative analgesia.  It was dressed with sterile Xeroform, 4x4s, and wrapped lightly with a Coban dressing.  Tourniquet was deflated at 16 minutes.  The fingertips were pink with brisk capillary refill after deflation of the tourniquet.  The operative drapes were broken down and the patient was awoken from anesthesia safely.  She was transferred back to the stretcher and taken to the PACU in stable condition.   I will see her back in the office in 1 week for postoperative followup.  I will give her a prescription for Norco 5/325 1-2 tabs PO q6 hours prn pain, dispense # 10.    [name], MD Electronically signed, 11/05/23

## 2023-11-05 NOTE — Addendum Note (Signed)
Addendum  created 11/05/23 1556 by Beryle Lathe, MD   Clinical Note Signed

## 2023-11-05 NOTE — Transfer of Care (Signed)
Immediate Anesthesia Transfer of Care Note  Patient: Darlene Boyer  Procedure(s) Performed: RELEASE TRIGGER FINGER/A-1 PULLEY (Right: Hand)  Patient Location: PACU  Anesthesia Type:MAC and Bier block  Level of Consciousness: awake, alert , and oriented  Airway & Oxygen Therapy: Patient Spontanous Breathing  Post-op Assessment: Report given to RN and Post -op Vital signs reviewed and stable  Post vital signs: Reviewed and stable  Last Vitals:  Vitals Value Taken Time  BP    Temp    Pulse    Resp    SpO2      Last Pain:  Vitals:   11/05/23 1156  TempSrc: Temporal  PainSc: 0-No pain      Patients Stated Pain Goal: 4 (11/05/23 1156)  Complications: No notable events documented.

## 2023-11-05 NOTE — H&P (Signed)
Darlene Boyer is an 82 y.o. female.   Chief Complaint: trigger digit HPI: 82 yo female with triggering right long finger.  This has been injected without lasting resolution.  She wishes to proceed with operative trigger release.  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 Her smokeless tobacco use includes snuff. She reports that she does not drink alcohol and does not use drugs.  Medications: Medications Prior to Admission  Medication Sig Dispense Refill   diltiazem (CARDIZEM CD) 120 MG 24 hr capsule Take 1 capsule (120 mg total) by mouth daily. 90 capsule 3   Dulaglutide (TRULICITY) 0.75 MG/0.5ML SOAJ INJECT 0.75 MG SUBCUTANEOUSLY ONE TIME PER WEEK 6 mL 0   ferrous gluconate (FERGON) 324 MG tablet Take 1 tablet (324 mg total) by mouth daily with breakfast. 90 tablet 0   furosemide (LASIX) 20 MG tablet TAKE 1 TABLET BY MOUTH EVERY DAY 90 tablet 3   levothyroxine (SYNTHROID) 88 MCG tablet TAKE 1 TABLET BY MOUTH EVERY DAY 90 tablet 0   loratadine  (CLARITIN) 10 MG tablet Take 10 mg by mouth every other day.     losartan (COZAAR) 50 MG tablet TAKE 1 TABLET BY MOUTH EVERY DAY 90 tablet 1   metFORMIN (GLUCOPHAGE) 1000 MG tablet TAKE 1 TABLET BY MOUTH IN THE MORNING, AND 1/2 TABLET IN THE EVENING 135 tablet 1   metoprolol succinate (TOPROL-XL) 100 MG 24 hr tablet TAKE 1 TABLET BY MOUTH EVERY DAY 90 tablet 1   rivaroxaban (XARELTO) 20 MG TABS tablet Take 1 tablet (20 mg total) by mouth daily with supper. 90 tablet 1   rosuvastatin (CRESTOR) 10 MG tablet TAKE 1 TABLET BY MOUTH EVERY DAY 90 tablet 1    Results for orders placed or performed during the hospital encounter of 11/05/23 (from the past 48 hour(s))  Basic metabolic panel per protocol     Status: Abnormal   Collection Time: 11/04/23  2:00 PM  Result Value Ref Range   Sodium 142 135 - 145 mmol/L   Potassium 3.1 (L) 3.5 - 5.1 mmol/L   Chloride 103 98 - 111 mmol/L   CO2 28 22 - 32 mmol/L   Glucose, Bld 142 (H) 70 - 99 mg/dL    Comment: Glucose reference range applies only to  samples taken after fasting for at least 8 hours.   BUN 11 8 - 23 mg/dL   Creatinine, Ser 1.61 (H) 0.44 - 1.00 mg/dL   Calcium 9.3 8.9 - 09.6 mg/dL   GFR, Estimated 44 (L) >60 mL/min    Comment: (NOTE) Calculated using the CKD-EPI Creatinine Equation (2021)    Anion gap 11 5 - 15    Comment: Performed at [facility], [address] [place], [place] [address]  Glucose, capillary     Status: Abnormal   Collection Time: 11/05/23 12:06 PM  Result Value Ref Range   Glucose-Capillary 103 (H) 70 - 99 mg/dL    Comment: Glucose reference range applies only to samples taken after fasting for at least 8 hours.    No results found.    Blood pressure (!) 142/71, pulse 72, temperature 98.5 F (36.9 C), temperature source Temporal, resp. rate 16, height 5\' 3"  (1.6 m), weight 99.4 kg, SpO2 100%.  General appearance: alert, cooperative, and appears stated age Head: Normocephalic, without obvious  abnormality, atraumatic Neck: supple, symmetrical, trachea midline Extremities: Intact sensation and capillary refill all digits.  +epl/fpl/io.  No wounds.  Pulses: 2+ and symmetric Skin: Skin color, texture, turgor normal. No rashes or lesions Neurologic: Grossly normal Incision/Wound: none  Assessment/Plan Right long finger trigger digit.  Non operative and operative treatment options have been discussed with the patient and patient wishes to proceed with operative treatment. Risks, benefits and alternatives of surgery were discussed including risks of blood loss, infection, damage to nerves/vessels/tendons/ligament/bone, failure of surgery, need for additional surgery, complication with wound healing, stiffness, recurrence.  She voiced understanding of these risks and elected to proceed.    Darlene Boyer 11/05/2023, 1:13 PM

## 2023-11-05 NOTE — Anesthesia Postprocedure Evaluation (Addendum)
Anesthesia Post Note  Patient: Darlene Boyer  Procedure(s) Performed: RELEASE TRIGGER FINGER/A-1 PULLEY (Right: Hand)     Patient location during evaluation: PACU Anesthesia Type: Bier Block Level of consciousness: awake and alert Pain management: pain level controlled Vital Signs Assessment: post-procedure vital signs reviewed and stable Respiratory status: spontaneous breathing, nonlabored ventilation and respiratory function stable Cardiovascular status: stable and blood pressure returned to baseline Anesthetic complications: no   No notable events documented.  Last Vitals:  Vitals:   11/05/23 1156 11/05/23 1400  BP: (!) 142/71 136/65  Pulse: 72 65  Resp: 16 11  Temp: 36.9 C (!) 36.3 C  SpO2: 100% 98%    Last Pain:  Vitals:   11/05/23 1400  TempSrc:   PainSc: 0-No pain                 Beryle Lathe

## 2023-11-06 ENCOUNTER — Encounter (HOSPITAL_BASED_OUTPATIENT_CLINIC_OR_DEPARTMENT_OTHER): Payer: Self-pay | Admitting: Orthopedic Surgery

## 2023-11-12 DIAGNOSIS — M654 Radial styloid tenosynovitis [de Quervain]: Secondary | ICD-10-CM | POA: Diagnosis not present

## 2023-11-15 ENCOUNTER — Ambulatory Visit (INDEPENDENT_AMBULATORY_CARE_PROVIDER_SITE_OTHER): Payer: Medicare Other | Admitting: Podiatry

## 2023-11-15 ENCOUNTER — Encounter: Payer: Self-pay | Admitting: Podiatry

## 2023-11-15 DIAGNOSIS — M79675 Pain in left toe(s): Secondary | ICD-10-CM | POA: Diagnosis not present

## 2023-11-15 DIAGNOSIS — Z7901 Long term (current) use of anticoagulants: Secondary | ICD-10-CM | POA: Diagnosis not present

## 2023-11-15 DIAGNOSIS — M79674 Pain in right toe(s): Secondary | ICD-10-CM

## 2023-11-15 DIAGNOSIS — E118 Type 2 diabetes mellitus with unspecified complications: Secondary | ICD-10-CM | POA: Diagnosis not present

## 2023-11-15 DIAGNOSIS — L84 Corns and callosities: Secondary | ICD-10-CM

## 2023-11-15 DIAGNOSIS — B351 Tinea unguium: Secondary | ICD-10-CM | POA: Diagnosis not present

## 2023-11-15 NOTE — Progress Notes (Signed)
This patient returns to my office for at risk foot care.  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  This patient is unable to cut nails herself since the patient cannot reach her nails.These nails are painful walking and wearing shoes.  This patient presents for at risk foot care today.  General Appearance  Alert, conversant and in no acute stress.  Vascular  Dorsalis pedis and posterior tibial  pulses are palpable  bilaterally.  Capillary return is within normal limits  bilaterally. Temperature is within normal limits  bilaterally.  Neurologic  Senn-Weinstein monofilament wire test within normal limits  bilaterally. Muscle power within normal limits bilaterally.  Nails Thick disfigured discolored nails with subungual debris  from hallux to fifth toes bilaterally. No evidence of bacterial infection or drainage bilaterally.  Orthopedic  No limitations of motion  feet .  No crepitus or effusions noted.  No bony pathology or digital deformities noted.  Skin  normotropic skin with no porokeratosis noted bilaterally.  No signs of infections or ulcers noted.     Onychomycosis  Pain in right toes  Pain in left toes  Consent was obtained for treatment procedures.   Mechanical debridement of nails 1-5  bilaterally performed with a nail nipper.  Filed with dremel without incident.    Return office visit   3 months                  Told patient to return for periodic foot care and evaluation due to potential at risk complications.   Helane Gunther DPM

## 2023-11-26 ENCOUNTER — Other Ambulatory Visit: Payer: Self-pay | Admitting: Medical

## 2023-12-18 ENCOUNTER — Other Ambulatory Visit: Payer: Self-pay | Admitting: Medical

## 2023-12-18 ENCOUNTER — Encounter: Payer: Self-pay | Admitting: Internal Medicine

## 2023-12-20 NOTE — Telephone Encounter (Signed)
Pt scheduled a cpe for 01/21/24, she would like a refill on trulicity.

## 2023-12-21 ENCOUNTER — Other Ambulatory Visit: Payer: Self-pay | Admitting: Cardiology

## 2023-12-21 ENCOUNTER — Other Ambulatory Visit: Payer: Self-pay | Admitting: Medical

## 2023-12-23 MED ORDER — TRULICITY 0.75 MG/0.5ML ~~LOC~~ SOAJ: SUBCUTANEOUS | 0 refills | Status: DC

## 2023-12-23 NOTE — Telephone Encounter (Signed)
Refilled medication

## 2024-01-03 ENCOUNTER — Telehealth (INDEPENDENT_AMBULATORY_CARE_PROVIDER_SITE_OTHER): Payer: Medicare Other | Admitting: Medical

## 2024-01-03 VITALS — Wt 216.0 lb

## 2024-01-03 DIAGNOSIS — R058 Other specified cough: Secondary | ICD-10-CM

## 2024-01-03 DIAGNOSIS — J988 Other specified respiratory disorders: Secondary | ICD-10-CM | POA: Diagnosis not present

## 2024-01-03 DIAGNOSIS — Z7901 Long term (current) use of anticoagulants: Secondary | ICD-10-CM

## 2024-01-03 MED ORDER — ALBUTEROL SULFATE HFA 108 (90 BASE) MCG/ACT IN AERS
2.0000 | INHALATION_SPRAY | Freq: Four times a day (QID) | RESPIRATORY_TRACT | 0 refills | Status: DC | PRN
Start: 2024-01-03 — End: 2024-01-31

## 2024-01-03 MED ORDER — AMOXICILLIN 875 MG PO TABS: 875.0000 mg | ORAL_TABLET | Freq: Two times a day (BID) | ORAL | 0 refills | Status: AC

## 2024-01-03 MED ORDER — DM-GUAIFENESIN ER 30-600 MG PO TB12: 1.0000 | ORAL_TABLET | Freq: Two times a day (BID) | ORAL | 0 refills | Status: DC

## 2024-01-03 NOTE — Progress Notes (Signed)
 Subjective:     Patient ID: Darlene Boyer, female   DOB: Nov 17, 1941, 83 y.o.   MRN: [id number]  This visit type was conducted due to national recommendations for restrictions regarding the COVID-19 Pandemic (e.g. social distancing) in an effort to limit this patient's exposure and mitigate transmission in our community.  Due to their co-morbid illnesses, this patient is at least at moderate risk for complications without adequate follow up.  This format is felt to be most appropriate for this patient at this time.    Documentation for virtual audio and video telecommunications through [facility] [place] encounter:  The patient was located at home. The provider was located in the office. The patient did consent to this visit and is aware of possible charges through their insurance for this visit.  The other persons participating in this telemedicine service were none. Time spent on call was 20 minutes and in review of previous records 20 minutes total.  This virtual service is not related to other E/M service within previous 7 days.   HPI Chief Complaint  Patient presents with   bad cough    Bad cough with green mucous, runny nose, body aches, HA, ear pressure   Virtual consult for illness.  She has been dealing with illness all week since 5 days ago.  Going into the weekend she is worried that she is not getting better.  She notes productive cough of green mucus, runny nose, body aches, headache, ear pressure, wheezing a little bit.  She has a mild headache.  Cough is intermittently productive.  Currently not using any medications as she thought it would get better.  No sick contacts.  No edema, no chest pain.  No other aggravating or relieving factors. No other complaint.  Past Medical History:  Diagnosis Date   Arthritis    Atrial fibrillation (HCC)    Diabetes mellitus without complication (HCC)    Hypertension    Obesity    Thyroid  disease    Current Outpatient Medications on  File Prior to Visit  Medication Sig Dispense Refill   diltiazem  (CARDIZEM  CD) 120 MG 24 hr capsule Take 1 capsule (120 mg total) by mouth daily. 90 capsule 3   Dulaglutide  (TRULICITY ) 0.75 MG/0.5ML SOAJ INJECT 0.75 MG SUBCUTANEOUSLY ONE TIME PER WEEK 6 mL 0   ferrous gluconate  (FERGON) 324 MG tablet Take 1 tablet (324 mg total) by mouth daily with breakfast. 90 tablet 0   furosemide  (LASIX ) 20 MG tablet TAKE 1 TABLET BY MOUTH EVERY DAY 90 tablet 3   levothyroxine  (SYNTHROID ) 88 MCG tablet TAKE 1 TABLET BY MOUTH EVERY DAY 90 tablet 0   loratadine  (CLARITIN ) 10 MG tablet Take 10 mg by mouth every other day.     losartan  (COZAAR ) 50 MG tablet TAKE 1 TABLET BY MOUTH EVERY DAY 90 tablet 1   metFORMIN  (GLUCOPHAGE ) 1000 MG tablet TAKE 1 TABLET BY MOUTH IN THE MORNING, AND 1/2 TABLET IN THE EVENING 135 tablet 1   metoprolol  succinate (TOPROL -XL) 100 MG 24 hr tablet TAKE 1 TABLET BY MOUTH EVERY DAY 90 tablet 0   rivaroxaban  (XARELTO ) 20 MG TABS tablet Take 1 tablet (20 mg total) by mouth daily with supper. 90 tablet 1   rosuvastatin  (CRESTOR ) 10 MG tablet TAKE 1 TABLET BY MOUTH EVERY DAY 90 tablet 1   No current facility-administered medications on file prior to visit.    Review of Systems As in subjective    Objective:   Physical Exam Due to coronavirus pandemic  stay at home measures, patient visit was virtual and they were not examined in person.   Wt 216 lb (98 kg)   BMI 38.26 kg/m   General: Well-developed, well-nourished, no acute distress No labored breathing or wheezing     Assessment:     Encounter Diagnoses  Name Primary?   Cough productive of purulent sputum Yes   Respiratory tract infection    Current use of long term anticoagulation        Plan:     Discussed symptoms and concerns and timeframe of symptoms.  Begin Mucinex  DM to help with cough and congestion, begin antibiotic amoxicillin  as below.  Use inhaler as needed 2-3 times a day for wheezing and shortness of  breath and coughing fits.  Discussed proper use of inhaler.  Increase water intake, rest.  If not much improved within the next 36 hours or if worse over the weekend, then get reevaluated.  Darlene Boyer was seen today for bad cough.  Diagnoses and all orders for this visit:  Cough productive of purulent sputum  Respiratory tract infection  Current use of long term anticoagulation  Other orders -     amoxicillin  (AMOXIL ) 875 MG tablet; Take 1 tablet (875 mg total) by mouth 2 (two) times daily for 10 days. -     albuterol  (VENTOLIN  HFA) 108 (90 Base) MCG/ACT inhaler; Inhale 2 puffs into the lungs every 6 (six) hours as needed for wheezing or shortness of breath. -     dextromethorphan-guaiFENesin  (MUCINEX  DM) 30-600 MG 12hr tablet; Take 1 tablet by mouth 2 (two) times daily.    Follow up as needed

## 2024-01-04 ENCOUNTER — Other Ambulatory Visit: Payer: Self-pay | Admitting: Medical

## 2024-01-21 ENCOUNTER — Ambulatory Visit (INDEPENDENT_AMBULATORY_CARE_PROVIDER_SITE_OTHER): Payer: Medicare Other | Admitting: Medical

## 2024-01-21 ENCOUNTER — Encounter: Payer: Self-pay | Admitting: Medical

## 2024-01-21 VITALS — BP 132/82 | HR 80 | Ht 61.0 in | Wt 212.4 lb

## 2024-01-21 DIAGNOSIS — E118 Type 2 diabetes mellitus with unspecified complications: Secondary | ICD-10-CM

## 2024-01-21 DIAGNOSIS — E039 Hypothyroidism, unspecified: Secondary | ICD-10-CM | POA: Diagnosis not present

## 2024-01-21 DIAGNOSIS — I10 Essential (primary) hypertension: Secondary | ICD-10-CM

## 2024-01-21 DIAGNOSIS — R7989 Other specified abnormal findings of blood chemistry: Secondary | ICD-10-CM | POA: Insufficient documentation

## 2024-01-21 DIAGNOSIS — Z950 Presence of cardiac pacemaker: Secondary | ICD-10-CM

## 2024-01-21 DIAGNOSIS — R809 Proteinuria, unspecified: Secondary | ICD-10-CM | POA: Diagnosis not present

## 2024-01-21 DIAGNOSIS — E785 Hyperlipidemia, unspecified: Secondary | ICD-10-CM

## 2024-01-21 DIAGNOSIS — Z780 Asymptomatic menopausal state: Secondary | ICD-10-CM | POA: Diagnosis not present

## 2024-01-21 DIAGNOSIS — I700 Atherosclerosis of aorta: Secondary | ICD-10-CM

## 2024-01-21 NOTE — Patient Instructions (Signed)
  Separate significant issues discussed: Hypothyroidism-updated labs today, continue levothyroxine 88 mcg daily  Hyperlipidemia-updated labs, continue rosuvastatin Crestor 10 mg daily  Diabetes-continue Trulicity 0.75 mg weekly but we will likely increase that dose.  Labs today.  We will likely decrease metformin dose since she has headache on her kidney function last labs had started to show some changes  Elevated creatinine, proteinuria -we discussed kidney monitoring, labs, and she has had some elevated creatinine and changes in GFR in the past year, microalbuminuria as well.  Continue to hydrate well.  Avoid NSAIDs and other medications that would worsen kidney function.  She sees podiatry for enlarged hypertrophic nails  Continue routine follow-up with cardiology   General Recommendations: I recommend you see your eye doctor yearly for routine vision care.  I recommend you see your dentist yearly for routine dental care including hygiene visits twice yearly.   Vaccination recommendations were reviewed Immunization History  Administered Date(s) Administered   Influenza Split 09/13/2020, 10/27/2021   Influenza, High Dose Seasonal PF 08/16/2018, 09/09/2019, 09/02/2023   Influenza-Unspecified 10/06/2022   PFIZER(Purple Top)SARS-COV-2 Vaccination 02/21/2020, 03/13/2020, 11/30/2020, 07/20/2021   PNEUMOCOCCAL CONJUGATE-20 10/27/2021   Pfizer(Comirnaty)Fall Seasonal Vaccine 12 years and older 10/06/2022, 09/02/2023   Pneumococcal Polysaccharide-23 12/21/2020   Td 04/15/2020   Zoster Recombinant(Shingrix) 10/10/2018, 12/21/2020   Consider COVID vaccines   Bone health: Get at least 150 minutes of aerobic exercise weekly Get weight bearing exercise at least once weekly Bone density test:  A bone density test is an imaging test that uses a type of X-ray to measure the amount of calcium and other minerals in your bones. The test may be used to diagnose or screen you for a condition  that causes weak or thin bones (osteoporosis), predict your risk for a broken bone (fracture), or determine how well your osteoporosis treatment is working. The bone density test is recommended for females 65 and older, or females or males <65 if certain risk factors such as thyroid disease, long term use of steroids such as for asthma or rheumatological issues, vitamin D deficiency, estrogen deficiency, family history of osteoporosis, self or family history of fragility fracture in first degree relative.  Call to schedule bone density test  Please call to schedule your bone density test.   [facility]  [phone] [address] [place], [place] [address]   Heart health: Get at least 150 minutes of aerobic exercise weekly Limit alcohol It is important to maintain a healthy blood pressure and healthy cholesterol numbers  Heart disease screening: Screening for heart disease includes screening for blood pressure, fasting lipids, glucose/diabetes screening, BMI height to weight ratio, reviewed of smoking status, physical activity, and diet.    Goals include blood pressure 120/80 or less, maintaining a healthy lipid/cholesterol profile, preventing diabetes or keeping diabetes numbers under good control, not smoking or using tobacco products, exercising most days per week or at least 150 minutes per week of exercise, and eating healthy variety of fruits and vegetables, healthy oils, and avoiding unhealthy food choices like fried food, fast food, high sugar and high cholesterol foods.

## 2024-01-21 NOTE — Progress Notes (Signed)
 Subjective:   HPI  Darlene Boyer is a 83 y.o. female who presents for Chief Complaint  Patient presents with   Diabetes    Patient is here for fasting med check. Would also like to know if you could listen to her lungs as a follow up from 01/03/24 visit.     Patient Care Team: Anaiah Mcmannis, Kermit Balo, PA-C as PCP - General (Family Medicine) Rollene Rotunda, MD as PCP - Cardiology (Cardiology) Duke Salvia, MD as PCP - Electrophysiology (Cardiology) Dr. Rollene Rotunda, cardiology Dr. Sherryl Manges, cardiology Dr. Aquilla Hacker, ENT Dr. Ovid Curd, podiatry Dr. [name], hand surgery Dr. Erick Blinks, GI  Concerns: Doing ok  Hypothyroidism-compliant with medication 88 mcg daily  Diabetes-metformin gives her headaches.  She is still taking 1000 mg in the morning and sometimes uses a half tab in the afternoon depending what she eats something sweet.  No polyuria or polydipsia.  Her glucometer is not working needs a new glucometer.  Compliant with Trulicity 0.75 mg weekly  Hyperlipidemia-compliant with rosuvastatin Crestor 10 mg daily  She had a recent respiratory tract infection and would like to have her lungs rechecked.  Has sometimes a dry cough late at night.  No other respiratory symptoms.  Complaint with BP and heart medications including her Xarelto anticoagulant  Reviewed their medical, surgical, family, social, medication, and allergy history and updated chart as appropriate.  She does use her Lasix every day   No Known Allergies  Past Medical History:  Diagnosis Date   Arthritis    Atrial fibrillation (HCC)    Diabetes mellitus without complication (HCC)    Hypertension    Obesity    Thyroid disease     Current Outpatient Medications on File Prior to Visit  Medication Sig Dispense Refill   diltiazem (CARDIZEM CD) 120 MG 24 hr capsule Take 1 capsule (120 mg total) by mouth daily. 90 capsule 3   Dulaglutide (TRULICITY) 0.75 MG/0.5ML SOAJ INJECT 0.75 MG  SUBCUTANEOUSLY ONE TIME PER WEEK 6 mL 0   furosemide (LASIX) 20 MG tablet TAKE 1 TABLET BY MOUTH EVERY DAY 90 tablet 3   guaiFENesin (MUCINEX PO) Take 1 tablet by mouth every 12 (twelve) hours.     levothyroxine (SYNTHROID) 88 MCG tablet TAKE 1 TABLET BY MOUTH EVERY DAY 90 tablet 0   losartan (COZAAR) 50 MG tablet TAKE 1 TABLET BY MOUTH EVERY DAY 90 tablet 1   metFORMIN (GLUCOPHAGE) 1000 MG tablet TAKE 1 TABLET BY MOUTH IN THE MORNING, AND 1/2 TABLET IN THE EVENING 45 tablet 0   metoprolol succinate (TOPROL-XL) 100 MG 24 hr tablet TAKE 1 TABLET BY MOUTH EVERY DAY 90 tablet 0   rivaroxaban (XARELTO) 20 MG TABS tablet Take 1 tablet (20 mg total) by mouth daily with supper. 90 tablet 1   rosuvastatin (CRESTOR) 10 MG tablet TAKE 1 TABLET BY MOUTH EVERY DAY 90 tablet 1   albuterol (VENTOLIN HFA) 108 (90 Base) MCG/ACT inhaler Inhale 2 puffs into the lungs every 6 (six) hours as needed for wheezing or shortness of breath. (Patient not taking: Reported on 01/21/2024) 8 g 0   ferrous gluconate (FERGON) 324 MG tablet Take 1 tablet (324 mg total) by mouth daily with breakfast. (Patient not taking: Reported on 01/21/2024) 90 tablet 0   No current facility-administered medications on file prior to visit.      Current Outpatient Medications:    diltiazem (CARDIZEM CD) 120 MG 24 hr capsule, Take 1 capsule (120 mg total)  by mouth daily., Disp: 90 capsule, Rfl: 3   Dulaglutide (TRULICITY) 0.75 MG/0.5ML SOAJ, INJECT 0.75 MG SUBCUTANEOUSLY ONE TIME PER WEEK, Disp: 6 mL, Rfl: 0   furosemide (LASIX) 20 MG tablet, TAKE 1 TABLET BY MOUTH EVERY DAY, Disp: 90 tablet, Rfl: 3   guaiFENesin (MUCINEX PO), Take 1 tablet by mouth every 12 (twelve) hours., Disp: , Rfl:    levothyroxine (SYNTHROID) 88 MCG tablet, TAKE 1 TABLET BY MOUTH EVERY DAY, Disp: 90 tablet, Rfl: 0   losartan (COZAAR) 50 MG tablet, TAKE 1 TABLET BY MOUTH EVERY DAY, Disp: 90 tablet, Rfl: 1   metFORMIN (GLUCOPHAGE) 1000 MG tablet, TAKE 1 TABLET BY MOUTH IN  THE MORNING, AND 1/2 TABLET IN THE EVENING, Disp: 45 tablet, Rfl: 0   metoprolol succinate (TOPROL-XL) 100 MG 24 hr tablet, TAKE 1 TABLET BY MOUTH EVERY DAY, Disp: 90 tablet, Rfl: 0   rivaroxaban (XARELTO) 20 MG TABS tablet, Take 1 tablet (20 mg total) by mouth daily with supper., Disp: 90 tablet, Rfl: 1   rosuvastatin (CRESTOR) 10 MG tablet, TAKE 1 TABLET BY MOUTH EVERY DAY, Disp: 90 tablet, Rfl: 1   albuterol (VENTOLIN HFA) 108 (90 Base) MCG/ACT inhaler, Inhale 2 puffs into the lungs every 6 (six) hours as needed for wheezing or shortness of breath. (Patient not taking: Reported on 01/21/2024), Disp: 8 g, Rfl: 0   ferrous gluconate (FERGON) 324 MG tablet, Take 1 tablet (324 mg total) by mouth daily with breakfast. (Patient not taking: Reported on 01/21/2024), Disp: 90 tablet, Rfl: 0  Family History  Problem Relation Age of Onset   Emphysema Father    Cancer Mother        told lung and breast   Colon cancer Neg Hx    Esophageal cancer Neg Hx    Rectal cancer Neg Hx    Stomach cancer Neg Hx    ROS as in subjective      Objective:  BP 132/82   Pulse 80   Ht 5\' 1"  (1.549 m)   Wt 212 lb 6.4 oz (96.3 kg)   BMI 40.13 kg/m   BP Readings from Last 3 Encounters:  01/21/24 132/82  11/05/23 (!) 156/79  10/31/23 (!) 140/86   Wt Readings from Last 3 Encounters:  01/21/24 212 lb 6.4 oz (96.3 kg)  01/03/24 216 lb (98 kg)  11/05/23 219 lb 2.2 oz (99.4 kg)   General appearance: alert, no distress, WD/WN, African American female HEENT: normocephalic, conjunctiva/corneas normal, sclerae anicteric, PERRLA, EOMi, nares patent, no discharge or erythema, pharynx normal Oral cavity: MMM, tongue normal, teeth in good repair Neck: supple, no lymphadenopathy, no thyromegaly, no masses, normal ROM, no bruits Heart: RRR, normal S1, S2, no murmurs Lungs: CTA bilaterally, no wheezes, rhonchi, or rales Extremities: no edema, no cyanosis, no clubbing Pulses: 2+ symmetric, upper extremities, normal cap  refill   Diabetic Foot Exam - Simple   Simple Foot Form Diabetic Foot exam was performed with the following findings: Yes 01/21/2024 11:21 AM  Visual Inspection See comments: Yes Sensation Testing Intact to touch and monofilament testing bilaterally: Yes Pulse Check See comments: Yes Comments Hypertrophic toenails noted, 1+ pedal pulses, bilaterally      Assessment and Plan :   Encounter Diagnoses  Name Primary?   Postmenopausal estrogen deficiency Yes   Type 2 diabetes mellitus with complication, without long-term current use of insulin (HCC)    Hypothyroidism, unspecified type    Primary hypertension    Hyperlipidemia, unspecified hyperlipidemia type  Aortic atherosclerosis (HCC)     Pacemaker-MDT    Proteinuria, unspecified type    Microalbuminuria    Elevated serum creatinine      Separate significant issues discussed: Hypothyroidism-updated labs today, continue levothyroxine 88 mcg daily  Hyperlipidemia-updated labs, continue rosuvastatin Crestor 10 mg daily  Diabetes-continue Trulicity 0.75 mg weekly but we will likely increase that dose.  Labs today.  We will likely decrease metformin dose since she has headache on her kidney function last labs had started to show some changes  Elevated creatinine, proteinuria -we discussed kidney monitoring, labs, and she has had some elevated creatinine and changes in GFR in the past year, microalbuminuria as well.  Continue to hydrate well.  Avoid NSAIDs and other medications that would worsen kidney function.  She sees podiatry for enlarged hypertrophic nails  Continue routine follow-up with cardiology   General Recommendations: I recommend you see your eye doctor yearly for routine vision care.  I recommend you see your dentist yearly for routine dental care including hygiene visits twice yearly.   Vaccination recommendations were reviewed Immunization History  Administered Date(s) Administered   Influenza Split  09/13/2020, 10/27/2021   Influenza, High Dose Seasonal PF 08/16/2018, 09/09/2019, 09/02/2023   Influenza-Unspecified 10/06/2022   PFIZER(Purple Top)SARS-COV-2 Vaccination 02/21/2020, 03/13/2020, 11/30/2020, 07/20/2021   PNEUMOCOCCAL CONJUGATE-20 10/27/2021   Pfizer(Comirnaty)Fall Seasonal Vaccine 12 years and older 10/06/2022, 09/02/2023   Pneumococcal Polysaccharide-23 12/21/2020   Td 04/15/2020   Zoster Recombinant(Shingrix) 10/10/2018, 12/21/2020   Consider COVID vaccines   Bone health: Get at least 150 minutes of aerobic exercise weekly Get weight bearing exercise at least once weekly Bone density test:  A bone density test is an imaging test that uses a type of X-ray to measure the amount of calcium and other minerals in your bones. The test may be used to diagnose or screen you for a condition that causes weak or thin bones (osteoporosis), predict your risk for a broken bone (fracture), or determine how well your osteoporosis treatment is working. The bone density test is recommended for females 65 and older, or females or males <65 if certain risk factors such as thyroid disease, long term use of steroids such as for asthma or rheumatological issues, vitamin D deficiency, estrogen deficiency, family history of osteoporosis, self or family history of fragility fracture in first degree relative.  Call to schedule bone density test  Please call to schedule your bone density test.   [facility]  [phone] [address] [place], [place] [address]   Heart health: Get at least 150 minutes of aerobic exercise weekly Limit alcohol It is important to maintain a healthy blood pressure and healthy cholesterol numbers  Heart disease screening: Screening for heart disease includes screening for blood pressure, fasting lipids, glucose/diabetes screening, BMI height to weight ratio, reviewed of smoking status, physical activity, and diet.     Goals include blood pressure 120/80 or less, maintaining a healthy lipid/cholesterol profile, preventing diabetes or keeping diabetes numbers under good control, not smoking or using tobacco products, exercising most days per week or at least 150 minutes per week of exercise, and eating healthy variety of fruits and vegetables, healthy oils, and avoiding unhealthy food choices like fried food, fast food, high sugar and high cholesterol foods.     Vascular disease screening: For high risk individuals including smokers, diabetes, patients with known heart disease or high blood pressure, kidney disease, and others, screening for vascular disease or atherosclerosis of the  arteries is available.  Examples may include carotid ultrasound, abdominal aortic ultrasound, [name] blood flow screening in the legs, thoracic aorta screening.  [name] screen normal August 2023  April 2024 echocardiogram reviewed, ejection fraction 55 to 60%, no regional wall motion abnormalities,, severe right atrial dilation, moderate left atrial dilation, inferior vena cava dilated, moderately elevated pulmonary arterial systolic pressure    Shalece was seen today for diabetes.  Diagnoses and all orders for this visit:  Postmenopausal estrogen deficiency -     DG Bone Density; Future  Type 2 diabetes mellitus with complication, without long-term current use of insulin (HCC) -     Hemoglobin A1c -     Microalbumin/Creatinine Ratio, Urine  Hypothyroidism, unspecified type -     TSH + free T4  Primary hypertension -     Cancel: POCT Urinalysis DIP (Proadvantage Device)  Hyperlipidemia, unspecified hyperlipidemia type -     Comprehensive metabolic panel -     Lipid panel  Aortic atherosclerosis (HCC)   Pacemaker-MDT  Proteinuria, unspecified type  Microalbuminuria -     Comprehensive metabolic panel -     CBC with Differential/Platelet -     Microalbumin/Creatinine Ratio, Urine -     Cancel: POCT Urinalysis DIP  (Proadvantage Device)  Elevated serum creatinine    Follow-up pending labs

## 2024-01-22 LAB — LIPID PANEL
Chol/HDL Ratio: 1.8 {ratio} (ref 0.0–4.4)
Cholesterol, Total: 122 mg/dL (ref 100–199)
HDL: 69 mg/dL (ref >39)
LDL Chol Calc (NIH): 40 mg/dL (ref 0–99)
Triglycerides: 56 mg/dL (ref 0–149)
VLDL Cholesterol Cal: 13 mg/dL (ref 5–40)

## 2024-01-23 LAB — MICROALBUMIN / CREATININE URINE RATIO

## 2024-01-24 ENCOUNTER — Other Ambulatory Visit: Payer: Self-pay | Admitting: Medical

## 2024-01-24 LAB — COMPREHENSIVE METABOLIC PANEL
ALT: 14 [IU]/L (ref 0–32)
AST: 30 [IU]/L (ref 0–40)
Albumin: 4.4 g/dL (ref 3.7–4.7)
Alkaline Phosphatase: 77 [IU]/L (ref 44–121)
BUN/Creatinine Ratio: 13 (ref 12–28)
BUN: 12 mg/dL (ref 8–27)
Bilirubin Total: 0.4 mg/dL (ref 0.0–1.2)
CO2: 28 mmol/L (ref 20–29)
Calcium: 9 mg/dL (ref 8.7–10.3)
Chloride: 100 mmol/L (ref 96–106)
Creatinine, Ser: 0.92 mg/dL (ref 0.57–1.00)
Globulin, Total: 2.3 g/dL (ref 1.5–4.5)
Glucose: 119 mg/dL — ABNORMAL HIGH (ref 70–99)
Potassium: 2.6 mmol/L — ABNORMAL LOW (ref 3.5–5.2)
Sodium: 146 mmol/L — ABNORMAL HIGH (ref 134–144)
Total Protein: 6.7 g/dL (ref 6.0–8.5)
eGFR: 63 mL/min/{1.73_m2} (ref >59)

## 2024-01-24 LAB — CBC WITH DIFFERENTIAL/PLATELET
Basophils Absolute: 0 10*3/uL (ref 0.0–0.2)
Basos: 0 %
EOS (ABSOLUTE): 0 10*3/uL (ref 0.0–0.4)
Eos: 1 %
Hematocrit: 33.9 % — ABNORMAL LOW (ref 34.0–46.6)
Hemoglobin: 11 g/dL — ABNORMAL LOW (ref 11.1–15.9)
Immature Grans (Abs): 0 10*3/uL (ref 0.0–0.1)
Immature Granulocytes: 0 %
Lymphocytes Absolute: 1.8 10*3/uL (ref 0.7–3.1)
Lymphs: 35 %
MCH: 30.9 pg (ref 26.6–33.0)
MCHC: 32.4 g/dL (ref 31.5–35.7)
MCV: 95 fL (ref 79–97)
Monocytes Absolute: 0.3 10*3/uL (ref 0.1–0.9)
Monocytes: 7 %
Neutrophils Absolute: 2.8 10*3/uL (ref 1.4–7.0)
Neutrophils: 57 %
Platelets: 229 10*3/uL (ref 150–450)
RBC: 3.56 x10E6/uL — ABNORMAL LOW (ref 3.77–5.28)
RDW: 13.3 % (ref 11.7–15.4)
WBC: 5 10*3/uL (ref 3.4–10.8)

## 2024-01-24 LAB — MICROALBUMIN / CREATININE URINE RATIO

## 2024-01-24 LAB — TSH+FREE T4
Free T4: 1.64 ng/dL (ref 0.82–1.77)
TSH: 1.91 u[IU]/mL (ref 0.450–4.500)

## 2024-01-24 LAB — HEMOGLOBIN A1C
Est. average glucose Bld gHb Est-mCnc: 143 mg/dL
Hgb A1c MFr Bld: 6.6 % — ABNORMAL HIGH (ref 4.8–5.6)

## 2024-01-24 MED ORDER — LEVOTHYROXINE SODIUM 88 MCG PO TABS: 88.0000 ug | ORAL_TABLET | Freq: Every day | ORAL | 0 refills | Status: DC

## 2024-01-24 MED ORDER — POTASSIUM CHLORIDE ER 10 MEQ PO TBCR: 10.0000 meq | EXTENDED_RELEASE_TABLET | Freq: Every day | ORAL | 1 refills | Status: DC

## 2024-01-24 MED ORDER — TRULICITY 1.5 MG/0.5ML ~~LOC~~ SOAJ: 1.5000 mg | SUBCUTANEOUS | 2 refills | Status: DC

## 2024-01-24 MED ORDER — FERROUS GLUCONATE 324 (38 FE) MG PO TABS: 324.0000 mg | ORAL_TABLET | Freq: Every day | ORAL | 0 refills | Status: AC

## 2024-01-24 NOTE — Progress Notes (Signed)
 Results sent through MyChart

## 2024-01-24 NOTE — Progress Notes (Signed)
 It appears that the microalbumin urine test got canceled.  Not sure why?  Do we need to have her come in and repeat a urine so we can do this test  Find out from lab what happened

## 2024-01-27 ENCOUNTER — Other Ambulatory Visit: Payer: Self-pay | Admitting: Internal Medicine

## 2024-01-27 DIAGNOSIS — E118 Type 2 diabetes mellitus with unspecified complications: Secondary | ICD-10-CM

## 2024-01-28 ENCOUNTER — Other Ambulatory Visit

## 2024-01-28 ENCOUNTER — Other Ambulatory Visit: Payer: Self-pay | Admitting: Medical

## 2024-01-29 ENCOUNTER — Ambulatory Visit: Payer: Medicare Other

## 2024-01-29 ENCOUNTER — Other Ambulatory Visit

## 2024-01-29 DIAGNOSIS — I440 Atrioventricular block, first degree: Secondary | ICD-10-CM | POA: Diagnosis not present

## 2024-01-30 ENCOUNTER — Other Ambulatory Visit

## 2024-01-30 DIAGNOSIS — E118 Type 2 diabetes mellitus with unspecified complications: Secondary | ICD-10-CM

## 2024-01-31 ENCOUNTER — Other Ambulatory Visit: Payer: Self-pay | Admitting: Medical

## 2024-01-31 LAB — MICROALBUMIN / CREATININE URINE RATIO
Creatinine, Urine: 164.6 mg/dL
Microalb/Creat Ratio: 177 mg/g{creat} — ABNORMAL HIGH (ref 0–29)
Microalbumin, Urine: 291.8 ug/mL

## 2024-01-31 NOTE — Progress Notes (Signed)
 Her microalbumin test is actually gotten worse compared to prior  This is a early warning system regarding damage to the kidney from diabetes or high  blood pressure or both.  You are already on certain medicines to protect the kidney such as losartan and your blood sugar control is fairly good  Sometimes we will add either a medicine called Chauncey Mann or a medicine called Jardiance to further help protect the kidney.  This would be helpful to initiate or we could increase losartan to 100 mg  Either 1 of these would be an option.  Chauncey Mann is specifically meant to help reduce progression of kidney disease.  London Pepper is a medicine that would cause an increase in urination as you pee out some excess glucose and it can potentially put you at risk for yeast infection but it usually does work okay and it helps with blood sugar control  Let me know your thoughts on this

## 2024-02-01 LAB — CUP PACEART REMOTE DEVICE CHECK
Battery Remaining Longevity: 112 mo
Battery Voltage: 3 V
Brady Statistic RA Percent Paced: 0.36 %
Brady Statistic RV Percent Paced: 87.47 %
Date Time Interrogation Session: 20250305052212
Implantable Lead Connection Status: 753985
Implantable Lead Connection Status: 753985
Implantable Lead Implant Date: 20201207
Implantable Lead Location: 753859
Implantable Lead Location: 753860
Implantable Lead Model: 4076
Implantable Lead Model: 4076
Implantable Pulse Generator Implant Date: 20201207
Lead Channel Impedance Value: 304 Ohm
Lead Channel Impedance Value: 380 Ohm
Lead Channel Impedance Value: 418 Ohm
Lead Channel Impedance Value: 475 Ohm
Lead Channel Pacing Threshold Amplitude: 0.5 V
Lead Channel Pacing Threshold Amplitude: 0.625 V
Lead Channel Pacing Threshold Pulse Width: 0.4 ms
Lead Channel Pacing Threshold Pulse Width: 0.4 ms
Lead Channel Sensing Intrinsic Amplitude: 0.75 mV
Lead Channel Sensing Intrinsic Amplitude: 0.75 mV
Lead Channel Sensing Intrinsic Amplitude: 13.75 mV
Lead Channel Sensing Intrinsic Amplitude: 13.75 mV
Lead Channel Setting Pacing Amplitude: 1.5 V
Lead Channel Setting Pacing Amplitude: 2.5 V
Lead Channel Setting Pacing Pulse Width: 0.4 ms
Lead Channel Setting Sensing Sensitivity: 0.9 mV
Zone Setting Status: 755011
Zone Setting Status: 755011

## 2024-02-04 ENCOUNTER — Telehealth: Payer: Self-pay | Admitting: Family Medicine

## 2024-02-04 DIAGNOSIS — R7989 Other specified abnormal findings of blood chemistry: Secondary | ICD-10-CM

## 2024-02-04 NOTE — Telephone Encounter (Signed)
 Copied from CRM [id number]. Topic: Clinical - Medication Question >> Feb 04, 2024 10:15 AM Marlow Baars wrote: Reason for CRM: The patient called in stating her provider had asked if she would like to start taking a kidney medication called Chauncey Mann. She says after thinking about it yes she would like for this to be called into her  Pharmacy at   [id number] - [place], [place] - [address] AT [facility] [address]  Phone: [phone] Fax: [phone]  Please assist patient further

## 2024-02-04 NOTE — Telephone Encounter (Unsigned)
 Copied from CRM [id number]. Topic: Clinical - Medication Question >> Feb 04, 2024 10:15 AM Marlow Baars wrote: Reason for CRM: The patient called in stating her provider had asked if she would like to start taking a kidney medication called Chauncey Mann. She says after thinking about it yes she would like for this to be called into her  Pharmacy at   [id number] - [place], [place] - [address] AT [facility] [address]  Phone: [phone] Fax: [phone]  Please assist patient further

## 2024-02-05 ENCOUNTER — Other Ambulatory Visit: Payer: Self-pay | Admitting: Medical

## 2024-02-05 MED ORDER — KERENDIA 10 MG PO TABS: 1.0000 | ORAL_TABLET | Freq: Every day | ORAL | 2 refills | Status: DC

## 2024-02-05 NOTE — Telephone Encounter (Signed)
 Left detailed message for pt to call the office back to schedule a lab visit in a month for lab and bp recheck. I have put in future lab order

## 2024-02-06 ENCOUNTER — Other Ambulatory Visit (HOSPITAL_COMMUNITY): Payer: Self-pay

## 2024-02-06 ENCOUNTER — Telehealth: Payer: Self-pay

## 2024-02-06 ENCOUNTER — Other Ambulatory Visit: Payer: Self-pay | Admitting: Medical

## 2024-02-06 NOTE — Telephone Encounter (Signed)
 Pharmacy Patient Advocate Encounter   Received notification from Physician's Office that prior authorization for Kerendia 10MG  tablets is required/requested.   Insurance verification completed.   The patient is insured through [facility].   Per test claim: PA required; PA submitted to above mentioned insurance via CoverMyMeds Key/confirmation #/EOC  (Key: [id number])   Status is pending

## 2024-02-06 NOTE — Telephone Encounter (Signed)
 Darlene Boyer discontinued med due to headaches at 12/2023 visit

## 2024-02-06 NOTE — Telephone Encounter (Signed)
 Pharmacy Patient Advocate Encounter  Received notification from [facility] that Prior Authorization for {Kerendia 10MG  tablets has been DENIED.  Full denial letter will be uploaded to the media tab. See denial reason below.  Pts insurance states the pt must trial and fail 1 of the following

## 2024-02-07 ENCOUNTER — Other Ambulatory Visit: Payer: Self-pay | Admitting: Medical

## 2024-02-07 MED ORDER — EMPAGLIFLOZIN 10 MG PO TABS: 10.0000 mg | ORAL_TABLET | Freq: Every day | ORAL | 2 refills | Status: DC

## 2024-02-07 NOTE — Telephone Encounter (Signed)
 Pt is agreeable to Jardiance . Please send in

## 2024-02-11 ENCOUNTER — Other Ambulatory Visit: Payer: Self-pay | Admitting: Cardiology

## 2024-02-11 ENCOUNTER — Encounter: Payer: Self-pay | Admitting: Internal Medicine

## 2024-02-11 ENCOUNTER — Other Ambulatory Visit: Payer: Self-pay | Admitting: Medical

## 2024-02-11 ENCOUNTER — Telehealth: Payer: Self-pay | Admitting: Internal Medicine

## 2024-02-11 MED ORDER — DAPAGLIFLOZIN PROPANEDIOL 5 MG PO TABS: 5.0000 mg | ORAL_TABLET | Freq: Every day | ORAL | 2 refills | Status: DC

## 2024-02-11 NOTE — Telephone Encounter (Signed)
 Left message for pt to call me back

## 2024-02-11 NOTE — Telephone Encounter (Signed)
 Pt informed of Sabrina's message of change in medication to [place]

## 2024-02-11 NOTE — Telephone Encounter (Signed)
 Patient states that her insurance will not cover Jardiance or Chauncey Mann and wants to know if we can do farixga

## 2024-02-11 NOTE — Telephone Encounter (Signed)
 Copied from CRM [id number]. Topic: Clinical - Medication Question >> Feb 11, 2024  8:39 AM Geroge Baseman wrote: Reason for CRM: Requesting a call back from Khristie Sak, she is having trouble with prescription and needs to speak with.

## 2024-02-12 ENCOUNTER — Other Ambulatory Visit (HOSPITAL_COMMUNITY): Payer: Self-pay

## 2024-02-18 ENCOUNTER — Ambulatory Visit: Payer: Medicare Other | Admitting: Podiatry

## 2024-02-19 NOTE — Telephone Encounter (Unsigned)
 Copied from CRM [id number]. Topic: Clinical - Lab/Test Results >> Feb 19, 2024  8:28 AM Gildardo Pounds wrote: Reason for CRM: Patient is inquiring about kidneys being bad based on the results received. Patient does not understand the information. Please call to give clarity to patient. Callback number is [phone]

## 2024-02-24 ENCOUNTER — Ambulatory Visit (INDEPENDENT_AMBULATORY_CARE_PROVIDER_SITE_OTHER): Admitting: Podiatry

## 2024-02-24 ENCOUNTER — Encounter: Payer: Self-pay | Admitting: Podiatry

## 2024-02-24 DIAGNOSIS — M79674 Pain in right toe(s): Secondary | ICD-10-CM | POA: Diagnosis not present

## 2024-02-24 DIAGNOSIS — E118 Type 2 diabetes mellitus with unspecified complications: Secondary | ICD-10-CM

## 2024-02-24 DIAGNOSIS — M79675 Pain in left toe(s): Secondary | ICD-10-CM | POA: Diagnosis not present

## 2024-02-24 DIAGNOSIS — L84 Corns and callosities: Secondary | ICD-10-CM | POA: Diagnosis not present

## 2024-02-24 DIAGNOSIS — B351 Tinea unguium: Secondary | ICD-10-CM | POA: Diagnosis not present

## 2024-02-24 NOTE — Progress Notes (Signed)
This patient returns to my office for at risk foot care.  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  This patient is unable to cut nails herself since the patient cannot reach her nails.These nails are painful walking and wearing shoes.  This patient presents for at risk foot care today.  General Appearance  Alert, conversant and in no acute stress.  Vascular  Dorsalis pedis and posterior tibial  pulses are palpable  bilaterally.  Capillary return is within normal limits  bilaterally. Temperature is within normal limits  bilaterally.  Neurologic  Senn-Weinstein monofilament wire test within normal limits  bilaterally. Muscle power within normal limits bilaterally.  Nails Thick disfigured discolored nails with subungual debris  from hallux to fifth toes bilaterally. No evidence of bacterial infection or drainage bilaterally.  Orthopedic  No limitations of motion  feet .  No crepitus or effusions noted.  No bony pathology or digital deformities noted.  Skin  normotropic skin with no porokeratosis noted bilaterally.  No signs of infections or ulcers noted.     Onychomycosis  Pain in right toes  Pain in left toes  Consent was obtained for treatment procedures.   Mechanical debridement of nails 1-5  bilaterally performed with a nail nipper.  Filed with dremel without incident.    Return office visit   3 months                  Told patient to return for periodic foot care and evaluation due to potential at risk complications.   Helane Gunther DPM

## 2024-03-03 DIAGNOSIS — M67441 Ganglion, right hand: Secondary | ICD-10-CM | POA: Diagnosis not present

## 2024-03-13 NOTE — Progress Notes (Signed)
 Remote pacemaker transmission.

## 2024-03-13 NOTE — Addendum Note (Signed)
 Addended by: Lott Rouleau A on: 03/13/2024 09:04 AM   Modules accepted: Orders

## 2024-04-01 ENCOUNTER — Ambulatory Visit: Payer: Medicare Other | Admitting: Medical

## 2024-04-01 ENCOUNTER — Telehealth: Payer: Self-pay | Admitting: Internal Medicine

## 2024-04-01 NOTE — Telephone Encounter (Signed)
 Pt canceled her CPE for 12:15pm. Please reschedule  Copied from CRM [id number]. Topic: Appointments - Appointment Cancel/Reschedule >> Apr 01, 2024 10:54 AM Oddis Bench wrote: Patient/patient representative is calling to reschedule an appointment. Refer to attachments for appointment information. Patient is stating that she is not feeling well and has a cold

## 2024-04-17 ENCOUNTER — Other Ambulatory Visit: Payer: Self-pay | Admitting: Medical

## 2024-04-18 ENCOUNTER — Other Ambulatory Visit: Payer: Self-pay | Admitting: Medical

## 2024-04-21 NOTE — Telephone Encounter (Signed)
 Patient was discontinued from this and placed on something else

## 2024-04-28 ENCOUNTER — Other Ambulatory Visit: Payer: Self-pay | Admitting: Medical

## 2024-04-28 ENCOUNTER — Other Ambulatory Visit: Payer: Self-pay | Admitting: Cardiology

## 2024-04-28 DIAGNOSIS — I484 Atypical atrial flutter: Secondary | ICD-10-CM

## 2024-04-29 ENCOUNTER — Ambulatory Visit (INDEPENDENT_AMBULATORY_CARE_PROVIDER_SITE_OTHER): Payer: Medicare Other

## 2024-04-29 DIAGNOSIS — I440 Atrioventricular block, first degree: Secondary | ICD-10-CM | POA: Diagnosis not present

## 2024-04-29 LAB — CUP PACEART REMOTE DEVICE CHECK
Battery Remaining Longevity: 108 mo
Battery Voltage: 3 V
Brady Statistic AP VP Percent: 15.92 %
Brady Statistic AP VS Percent: 56.08 %
Brady Statistic AS VP Percent: 23.46 %
Brady Statistic AS VS Percent: 4.56 %
Brady Statistic RA Percent Paced: 2.32 %
Brady Statistic RV Percent Paced: 87.72 %
Date Time Interrogation Session: 20250603205107
Implantable Lead Connection Status: 753985
Implantable Lead Connection Status: 753985
Implantable Lead Implant Date: 20201207
Implantable Lead Location: 753859
Implantable Lead Location: 753860
Implantable Lead Model: 4076
Implantable Lead Model: 4076
Implantable Pulse Generator Implant Date: 20201207
Lead Channel Impedance Value: 323 Ohm
Lead Channel Impedance Value: 399 Ohm
Lead Channel Impedance Value: 418 Ohm
Lead Channel Impedance Value: 475 Ohm
Lead Channel Pacing Threshold Amplitude: 0.625 V
Lead Channel Pacing Threshold Amplitude: 0.625 V
Lead Channel Pacing Threshold Pulse Width: 0.4 ms
Lead Channel Pacing Threshold Pulse Width: 0.4 ms
Lead Channel Sensing Intrinsic Amplitude: 15.75 mV
Lead Channel Sensing Intrinsic Amplitude: 15.75 mV
Lead Channel Sensing Intrinsic Amplitude: 4.125 mV
Lead Channel Sensing Intrinsic Amplitude: 4.125 mV
Lead Channel Setting Pacing Amplitude: 1.5 V
Lead Channel Setting Pacing Amplitude: 2.5 V
Lead Channel Setting Pacing Pulse Width: 0.4 ms
Lead Channel Setting Sensing Sensitivity: 0.9 mV
Zone Setting Status: 755011
Zone Setting Status: 755011

## 2024-04-29 NOTE — Telephone Encounter (Signed)
 Xarelto  20mg  refill request received. Pt is 83 years old, weight-96.3kg, Crea-0.92 on 01/21/24, last seen by Dr. Lavonne Prairie on 10/31/23 , Diagnosis-Aflutter, CrCl-72.91 mL/min; Dose is appropriate based on dosing criteria. Will send in refill to requested pharmacy.

## 2024-05-08 ENCOUNTER — Ambulatory Visit: Payer: Self-pay | Admitting: Cardiology

## 2024-05-25 ENCOUNTER — Ambulatory Visit: Admitting: Podiatry

## 2024-05-25 ENCOUNTER — Encounter: Payer: Self-pay | Admitting: Podiatry

## 2024-05-25 DIAGNOSIS — M79675 Pain in left toe(s): Secondary | ICD-10-CM

## 2024-05-25 DIAGNOSIS — B351 Tinea unguium: Secondary | ICD-10-CM

## 2024-05-25 DIAGNOSIS — E118 Type 2 diabetes mellitus with unspecified complications: Secondary | ICD-10-CM

## 2024-05-25 DIAGNOSIS — M79674 Pain in right toe(s): Secondary | ICD-10-CM

## 2024-05-25 NOTE — Progress Notes (Signed)
This patient returns to my office for at risk foot care.  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  This patient is unable to cut nails herself since the patient cannot reach her nails.These nails are painful walking and wearing shoes.  This patient presents for at risk foot care today.  General Appearance  Alert, conversant and in no acute stress.  Vascular  Dorsalis pedis and posterior tibial  pulses are palpable  bilaterally.  Capillary return is within normal limits  bilaterally. Temperature is within normal limits  bilaterally.  Neurologic  Senn-Weinstein monofilament wire test within normal limits  bilaterally. Muscle power within normal limits bilaterally.  Nails Thick disfigured discolored nails with subungual debris  from hallux to fifth toes bilaterally. No evidence of bacterial infection or drainage bilaterally.  Orthopedic  No limitations of motion  feet .  No crepitus or effusions noted.  No bony pathology or digital deformities noted.  Skin  normotropic skin with no porokeratosis noted bilaterally.  No signs of infections or ulcers noted.     Onychomycosis  Pain in right toes  Pain in left toes  Consent was obtained for treatment procedures.   Mechanical debridement of nails 1-5  bilaterally performed with a nail nipper.  Filed with dremel without incident.    Return office visit   3 months                  Told patient to return for periodic foot care and evaluation due to potential at risk complications.   Helane Gunther DPM

## 2024-06-04 ENCOUNTER — Ambulatory Visit: Admitting: Podiatry

## 2024-06-04 ENCOUNTER — Other Ambulatory Visit: Payer: Self-pay | Admitting: Podiatry

## 2024-06-04 ENCOUNTER — Ambulatory Visit (INDEPENDENT_AMBULATORY_CARE_PROVIDER_SITE_OTHER)

## 2024-06-04 ENCOUNTER — Encounter: Payer: Self-pay | Admitting: Podiatry

## 2024-06-04 DIAGNOSIS — T847XXA Infection and inflammatory reaction due to other internal orthopedic prosthetic devices, implants and grafts, initial encounter: Secondary | ICD-10-CM | POA: Diagnosis not present

## 2024-06-04 DIAGNOSIS — G720 Drug-induced myopathy: Secondary | ICD-10-CM | POA: Insufficient documentation

## 2024-06-04 DIAGNOSIS — K8309 Other cholangitis: Secondary | ICD-10-CM | POA: Insufficient documentation

## 2024-06-04 DIAGNOSIS — D6869 Other thrombophilia: Secondary | ICD-10-CM | POA: Insufficient documentation

## 2024-06-04 DIAGNOSIS — H9312 Tinnitus, left ear: Secondary | ICD-10-CM | POA: Insufficient documentation

## 2024-06-04 DIAGNOSIS — E2839 Other primary ovarian failure: Secondary | ICD-10-CM | POA: Insufficient documentation

## 2024-06-04 DIAGNOSIS — J309 Allergic rhinitis, unspecified: Secondary | ICD-10-CM | POA: Insufficient documentation

## 2024-06-04 DIAGNOSIS — L03032 Cellulitis of left toe: Secondary | ICD-10-CM | POA: Diagnosis not present

## 2024-06-04 DIAGNOSIS — M7751 Other enthesopathy of right foot: Secondary | ICD-10-CM

## 2024-06-04 DIAGNOSIS — Z860100 Personal history of colon polyps, unspecified: Secondary | ICD-10-CM | POA: Insufficient documentation

## 2024-06-04 DIAGNOSIS — G479 Sleep disorder, unspecified: Secondary | ICD-10-CM | POA: Insufficient documentation

## 2024-06-04 MED ORDER — CEPHALEXIN 500 MG PO CAPS: 500.0000 mg | ORAL_CAPSULE | Freq: Three times a day (TID) | ORAL | 0 refills | Status: AC

## 2024-06-04 NOTE — Progress Notes (Signed)
 Chief Complaint  Patient presents with   Toe Pain    2nd toe left - previous hammertoe surgery, noticed Sunday the tip of the toe was very swollen and sensitive to touch    HPI: 83 y.o. female presents today with left second toe pain and swelling.  She had history of hammertoe repair with screw fixation years ago.  The area became painful over the past week or 2.  She is diabetic, last A1c 6.6.  She denies any trauma to the area.  Past Medical History:  Diagnosis Date   Arthritis    Atrial fibrillation (HCC)    Diabetes mellitus without complication (HCC)    Hypertension    Obesity    Thyroid disease     Past Surgical History:  Procedure Laterality Date   ABDOMINAL HYSTERECTOMY     partial   ANKLE FRACTURE SURGERY Right    COLONOSCOPY  01/27/2020   EYE SURGERY     Lens implants   FOOT SURGERY Left    Pins in 3 toes   GALLBLADDER SURGERY     PACEMAKER IMPLANT N/A 11/02/2019   Procedure: PACEMAKER IMPLANT;  Surgeon: Klein, Steven C, MD;  Location: [facility];  Service: Cardiovascular;  Laterality: N/A;   TRIGGER FINGER RELEASE Right 11/05/2023   Procedure: RELEASE TRIGGER FINGER/A-1 PULLEY;  Surgeon: Kuzma, Kevin, MD;  Location: [facility];  Service: Orthopedics;  Laterality: Right;   TUBAL LIGATION     UPPER GASTROINTESTINAL ENDOSCOPY  01/27/2020    No Known Allergies  ROS denies any nausea, vomiting, fever, chills, chest pain, shortness of breath.   Physical Exam: There were no vitals filed for this visit.  General: The patient is alert and oriented x3 in no acute distress.  Dermatology: Skin is warm, dry and supple bilateral lower extremities. Interspaces are clear of maceration and debris.  Well-healed scar to dorsal 2nd and 3rd toes left foot  Vascular: Palpable pedal pulses bilaterally. Capillary refill within normal limits.  Localized edema to the left second toe with mild erythema.  No warmth increased.  Neurological: Light touch  sensation grossly intact bilateral feet.   Musculoskeletal Exam: Prior hammertoe correction left 2nd and 3rd toe.  Radiographic Exam: Left foot 3 views weightbearing 06/04/2024 Normal osseous mineralization.  Prior hammertoe correction with screw fixation present to the 2nd and 3rd toes.  Hardware appears stable without obvious signs of loosening.  Assessment/Plan of Care: 1. Internal fixation device (pin, rod, or screw) infection-inflammation, initial encounter (HCC)   2. Cellulitis of toe of left foot      Meds ordered this encounter  Medications   cephALEXin (KEFLEX) 500 MG capsule    Sig: Take 1 capsule (500 mg total) by mouth 3 (three) times daily for 7 days.    Dispense:  21 capsule    Refill:  0   None  Discussed clinical findings with patient today.  Radiographs reviewed with patient  No obvious signs of hardware loosening.  Do have some concern given the erythema and edema to the toe.  Starting antibiotics have abundance of caution.  Ordering CBC, CRP, sed rate to evaluate for any signs of underlying infection.  Recommend RICE therapy in the interim.  Follow-up in 2 weeks for reevaluation.   Darlene Boyer L. Darlene Boyer, DPM, AACFAS [facility]     [address]  [place], [place] [address]                Office [phone]  Fax [phone]

## 2024-06-11 ENCOUNTER — Other Ambulatory Visit: Payer: Self-pay | Admitting: Medical

## 2024-06-22 NOTE — Progress Notes (Signed)
 Remote pacemaker transmission.

## 2024-06-24 ENCOUNTER — Ambulatory Visit: Admitting: Medical

## 2024-06-25 ENCOUNTER — Encounter: Payer: Self-pay | Admitting: Medical

## 2024-06-26 ENCOUNTER — Telehealth: Payer: Self-pay | Admitting: Podiatry

## 2024-06-26 ENCOUNTER — Ambulatory Visit: Admitting: Podiatry

## 2024-06-26 NOTE — Telephone Encounter (Signed)
 Called patient to confirm cancellation for 06/26/24

## 2024-07-05 ENCOUNTER — Other Ambulatory Visit: Payer: Self-pay | Admitting: Medical

## 2024-07-16 ENCOUNTER — Ambulatory Visit: Admitting: Podiatry

## 2024-07-29 ENCOUNTER — Ambulatory Visit (INDEPENDENT_AMBULATORY_CARE_PROVIDER_SITE_OTHER)

## 2024-07-29 DIAGNOSIS — I484 Atypical atrial flutter: Secondary | ICD-10-CM | POA: Diagnosis not present

## 2024-07-30 ENCOUNTER — Ambulatory Visit: Payer: Self-pay | Admitting: Cardiology

## 2024-07-30 LAB — CUP PACEART REMOTE DEVICE CHECK
Battery Remaining Longevity: 106 mo
Battery Voltage: 3 V
Brady Statistic RA Percent Paced: 1.54 %
Brady Statistic RV Percent Paced: 62.79 %
Date Time Interrogation Session: 20250903042717
Implantable Lead Connection Status: 753985
Implantable Lead Connection Status: 753985
Implantable Lead Implant Date: 20201207
Implantable Lead Location: 753859
Implantable Lead Location: 753860
Implantable Lead Model: 4076
Implantable Lead Model: 4076
Implantable Pulse Generator Implant Date: 20201207
Lead Channel Impedance Value: 304 Ohm
Lead Channel Impedance Value: 380 Ohm
Lead Channel Impedance Value: 418 Ohm
Lead Channel Impedance Value: 475 Ohm
Lead Channel Pacing Threshold Amplitude: 0.625 V
Lead Channel Pacing Threshold Amplitude: 0.625 V
Lead Channel Pacing Threshold Pulse Width: 0.4 ms
Lead Channel Pacing Threshold Pulse Width: 0.4 ms
Lead Channel Sensing Intrinsic Amplitude: 1 mV
Lead Channel Sensing Intrinsic Amplitude: 1 mV
Lead Channel Sensing Intrinsic Amplitude: 14.875 mV
Lead Channel Sensing Intrinsic Amplitude: 14.875 mV
Lead Channel Setting Pacing Amplitude: 1.5 V
Lead Channel Setting Pacing Amplitude: 2.5 V
Lead Channel Setting Pacing Pulse Width: 0.4 ms
Lead Channel Setting Sensing Sensitivity: 0.9 mV
Zone Setting Status: 755011
Zone Setting Status: 755011

## 2024-08-08 NOTE — Progress Notes (Signed)
 Remote PPM Transmission

## 2024-08-11 DIAGNOSIS — I4891 Unspecified atrial fibrillation: Secondary | ICD-10-CM | POA: Diagnosis not present

## 2024-08-11 DIAGNOSIS — E1169 Type 2 diabetes mellitus with other specified complication: Secondary | ICD-10-CM | POA: Diagnosis not present

## 2024-08-11 DIAGNOSIS — I509 Heart failure, unspecified: Secondary | ICD-10-CM | POA: Diagnosis not present

## 2024-08-11 DIAGNOSIS — Z1159 Encounter for screening for other viral diseases: Secondary | ICD-10-CM | POA: Diagnosis not present

## 2024-08-11 DIAGNOSIS — E785 Hyperlipidemia, unspecified: Secondary | ICD-10-CM | POA: Diagnosis not present

## 2024-08-11 DIAGNOSIS — I110 Hypertensive heart disease with heart failure: Secondary | ICD-10-CM | POA: Diagnosis not present

## 2024-08-11 DIAGNOSIS — I272 Pulmonary hypertension, unspecified: Secondary | ICD-10-CM | POA: Diagnosis not present

## 2024-08-11 DIAGNOSIS — I130 Hypertensive heart and chronic kidney disease with heart failure and stage 1 through stage 4 chronic kidney disease, or unspecified chronic kidney disease: Secondary | ICD-10-CM | POA: Diagnosis not present

## 2024-08-11 DIAGNOSIS — E039 Hypothyroidism, unspecified: Secondary | ICD-10-CM | POA: Diagnosis not present

## 2024-08-11 DIAGNOSIS — Z79899 Other long term (current) drug therapy: Secondary | ICD-10-CM | POA: Diagnosis not present

## 2024-08-24 ENCOUNTER — Ambulatory Visit: Admitting: Podiatry

## 2024-08-25 ENCOUNTER — Other Ambulatory Visit: Payer: Self-pay | Admitting: Medical

## 2024-08-25 DIAGNOSIS — I4891 Unspecified atrial fibrillation: Secondary | ICD-10-CM | POA: Diagnosis not present

## 2024-08-25 DIAGNOSIS — D649 Anemia, unspecified: Secondary | ICD-10-CM | POA: Diagnosis not present

## 2024-08-25 DIAGNOSIS — Z0001 Encounter for general adult medical examination with abnormal findings: Secondary | ICD-10-CM | POA: Diagnosis not present

## 2024-08-25 DIAGNOSIS — I272 Pulmonary hypertension, unspecified: Secondary | ICD-10-CM | POA: Diagnosis not present

## 2024-08-25 DIAGNOSIS — R011 Cardiac murmur, unspecified: Secondary | ICD-10-CM | POA: Diagnosis not present

## 2024-08-25 DIAGNOSIS — E113292 Type 2 diabetes mellitus with mild nonproliferative diabetic retinopathy without macular edema, left eye: Secondary | ICD-10-CM | POA: Diagnosis not present

## 2024-08-25 DIAGNOSIS — Z23 Encounter for immunization: Secondary | ICD-10-CM | POA: Diagnosis not present

## 2024-08-25 DIAGNOSIS — E1169 Type 2 diabetes mellitus with other specified complication: Secondary | ICD-10-CM | POA: Diagnosis not present

## 2024-08-25 DIAGNOSIS — E785 Hyperlipidemia, unspecified: Secondary | ICD-10-CM | POA: Diagnosis not present

## 2024-08-25 DIAGNOSIS — F17220 Nicotine dependence, chewing tobacco, uncomplicated: Secondary | ICD-10-CM | POA: Diagnosis not present

## 2024-08-25 DIAGNOSIS — E039 Hypothyroidism, unspecified: Secondary | ICD-10-CM | POA: Diagnosis not present

## 2024-08-25 NOTE — Progress Notes (Signed)
   08/25/2024  Patient ID: Darlene Boyer, female   DOB: [birth year], 83 y.o.   MRN: [id number]  Pharmacy Quality Measure Review  This patient is appearing on a report for being at risk of failing the adherence measure for hypertension (ACEi/ARB) medications this calendar year.   Medication: Losartan   Last fill date: 07/05/24 for 90 day supply  Insurance report was not up to date. No action needed at this time.   Jon VEAR Lindau, PharmD Clinical Pharmacist [phone]

## 2024-08-26 NOTE — Telephone Encounter (Signed)
 Left message for pt to call to schedule appt for cpe or med check

## 2024-08-26 NOTE — Telephone Encounter (Signed)
 Patient is overdue for a visit. No showed last visit. Please reschedule her visit and I will send in 30 days

## 2024-08-26 NOTE — Telephone Encounter (Signed)
 Looks like medication Trulicity  was increased

## 2024-08-31 ENCOUNTER — Other Ambulatory Visit: Payer: Self-pay | Admitting: Medical

## 2024-09-01 NOTE — Telephone Encounter (Signed)
Dose was increased to 15 mg

## 2024-09-28 ENCOUNTER — Encounter: Payer: Self-pay | Admitting: Radiology

## 2024-10-05 ENCOUNTER — Ambulatory Visit: Admitting: Podiatry

## 2024-10-06 NOTE — Progress Notes (Signed)
   10/06/2024  Patient ID: Darlene Boyer, female   DOB: October 28, 1941, 83 y.o.   MRN: [id number]  Pharmacy Quality Measure Review  This patient is appearing on a report for being at risk of failing the adherence measure for hypertension (ACEi/ARB) medications this calendar year.   Medication: Losartan   Last fill date: 10/04/24 for 90 day supply  Insurance report was not up to date. No action needed at this time.   Jon VEAR Lindau, PharmD Clinical Pharmacist [phone]

## 2024-10-28 ENCOUNTER — Ambulatory Visit: Attending: Cardiology | Admitting: Cardiology

## 2024-10-28 ENCOUNTER — Encounter: Payer: Self-pay | Admitting: Cardiology

## 2024-10-28 ENCOUNTER — Ambulatory Visit

## 2024-10-28 VITALS — BP 124/60 | HR 78 | Ht 61.0 in | Wt 220.6 lb

## 2024-10-28 DIAGNOSIS — I10 Essential (primary) hypertension: Secondary | ICD-10-CM

## 2024-10-28 DIAGNOSIS — R002 Palpitations: Secondary | ICD-10-CM

## 2024-10-28 DIAGNOSIS — I272 Pulmonary hypertension, unspecified: Secondary | ICD-10-CM

## 2024-10-28 DIAGNOSIS — E785 Hyperlipidemia, unspecified: Secondary | ICD-10-CM

## 2024-10-28 DIAGNOSIS — I4819 Other persistent atrial fibrillation: Secondary | ICD-10-CM | POA: Diagnosis not present

## 2024-10-28 DIAGNOSIS — Z950 Presence of cardiac pacemaker: Secondary | ICD-10-CM

## 2024-10-28 DIAGNOSIS — I484 Atypical atrial flutter: Secondary | ICD-10-CM

## 2024-10-28 MED ORDER — METOPROLOL SUCCINATE ER 100 MG PO TB24: 100.0000 mg | ORAL_TABLET | Freq: Every day | ORAL | 3 refills | Status: AC

## 2024-10-28 NOTE — Patient Instructions (Signed)
 Medication Instructions:  NO CHANGES *If you need a refill on your cardiac medications before your next appointment, please call your pharmacy*  Lab Work: [facility] AND CBC TODAY If you have labs (blood work) drawn today and your tests are completely normal, you will receive your results only by: MyChart Message (if you have MyChart) OR A paper copy in the mail If you have any lab test that is abnormal or we need to change your treatment, we will call you to review the results.  Testing/Procedures: NO TESTING  Follow-Up: At [facility], you and your health needs are our priority.  As part of our continuing mission to provide you with exceptional heart care, our providers are all part of one team.  This team includes your primary Cardiologist (physician) and Advanced Practice Providers or APPs (Physician Assistants and Nurse Practitioners) who all work together to provide you with the care you need, when you need it.  Your next appointment:   1 year(s)  Provider:   Rollo Louder, PA-C or Lynwood Schilling, MD    Other Instructions FOLLOW UP WITH EP IN 2-3 MONTHS  HOLD CRESTOR  FOR 3 WEEKS AND AT 3 WEEKS GIVE OFFICE A CALL TO SEE IF SYMPTOMS IMPROVED.

## 2024-10-28 NOTE — Progress Notes (Signed)
 Cardiology Office Note   Date:  10/28/2024  ID:  Darlene Boyer, DOB 1941-06-20, MRN [id number] PCP: Bulah Alm GORMAN DEVONNA  [facility] Providers Cardiologist:  Lynwood Schilling, MD Electrophysiologist:  Elspeth Sage, MD (Inactive)    History of Present Illness Darlene Boyer is a 83 y.o. female with a past medical history of atrial fibrillation, s/p PPM 2020, hypertension, hypothyroidism, arthritis.  Patient followed by Dr. Schilling.  Previously followed by Dr. Sage with EP.  Presents today for an annual follow-up appointment.  Patient previously underwent echocardiogram in 10/2019 that showed EF 65-70%, grade 2 diastolic dysfunction, mild mitral valve regurgitation, moderate tricuspid valve regurgitation.  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  Since then, she has been followed by both EP and general cardiology for atrial fibrillation.  Her most recent echocardiogram from 02/2023 showed EF 55-60%, no regional wall motion abnormalities, moderately reduced RV systolic function, moderately elevated PA systolic pressure, moderate left atrial dilation, severe right atrial dilation, moderate-severe TR. Afib has been managed with diltiazem , metoprolol , xarelto .   On interview today, patient reports that she is overall been doing well from a cardiac standpoint.  She denies any palpitations in her chest.  Denies feelings of racing heartbeat.  Sometimes after eating, she feels like she can feel her heartbeat in her stomach.  This resolves if she rubs her stomach.  Notices that this feeling is more significant if she eats too big of a meal.  She denie chest pain or shortness of breath.  Denies syncope, near syncope.  She has been compliant with her Xarelto  and denies bleeding issues.  Follows up regularly with a primary care provider.  She is currently taking her Crestor  every other day.  Is concerned that this medication might be contributing to her joint  pain.  We discussed taking a statin holiday.   Studies Reviewed EKG Interpretation Date/Time:  Wednesday October 28 2024 08:54:06 EST Ventricular Rate:  78 PR Interval:  184 QRS Duration:  198 QT Interval:  486 QTC Calculation: 554 R Axis:   -73  Text Interpretation: Atrial-sensed ventricular-paced rhythm When compared with ECG of 28-Oct-2023 14:30, Vent. rate has increased BY  15 BPM Confirmed by Vicci Sauer [phone]) on 10/28/2024 8:57:04 AM    Cardiac Studies & Procedures   ______________________________________________________________________________________________     ECHOCARDIOGRAM  ECHOCARDIOGRAM COMPLETE 02/26/2023  Narrative ECHOCARDIOGRAM REPORT    Patient Name:   Darlene Boyer Date of Exam: 02/26/2023 Medical Rec #:  [id number]        Height:       64.0 in Accession #:    [id number]       Weight:       230.0 lb Date of Birth:  1940-12-22       BSA:          2.076 m Patient Age:    81 years         BP:           110/62 mmHg Patient Gender: F                HR:           88 bpm. Exam Location:  [address]  Procedure: 2D Echo, 3D Echo, Strain Analysis, Cardiac Doppler and Color Doppler  Indications:    Pulmonary hypertension I27.2  History:        Patient has prior history of Echocardiogram examinations, most recent 02/07/2021. Arrythmias:Atrial Fibrillation; Risk Factors:Hypertension and Diabetes.  Sonographer:    Lauraine Pilot RDCS Referring Phys: [address]   Sonographer Comments: Global longitudinal strain was attempted. IMPRESSIONS   1. Left ventricular ejection fraction, by estimation, is 55 to 60%. Left ventricular ejection fraction by 3D volume is 54 %. The left ventricle has normal function. The left ventricle has no regional wall motion abnormalities. Left ventricular diastolic function could not be evaluated. The average left ventricular global longitudinal strain is -15.3 %. The global longitudinal strain is abnormal. 2. Right  ventricular systolic function is moderately reduced. The right ventricular size is mildly enlarged. There is moderately elevated pulmonary artery systolic pressure. The estimated right ventricular systolic pressure is 56.2 mmHg. 3. Left atrial size was moderately dilated. 4. Right atrial size was severely dilated. 5. The mitral valve is normal in structure. Trivial mitral valve regurgitation. No evidence of mitral stenosis. 6. Tricuspid valve regurgitation is moderate to severe. 7. The aortic valve is normal in structure. Aortic valve regurgitation is trivial. No aortic stenosis is present. 8. The inferior vena cava is dilated in size with <50% respiratory variability, suggesting right atrial pressure of 15 mmHg. 9. Compared to echo datd 02/07/21, RVSP has decreased from to  FINDINGS Left Ventricle: Left ventricular ejection fraction, by estimation, is 55 to 60%. Left ventricular ejection fraction by 3D volume is 54 %. The left ventricle has normal function. The left ventricle has no regional wall motion abnormalities. The average left ventricular global longitudinal strain is -15.3 %. The global longitudinal strain is abnormal. The left ventricular internal cavity size was normal in size. There is no left ventricular hypertrophy. Abnormal (paradoxical) septal motion, consistent with RV pacemaker. Left ventricular diastolic function could not be evaluated due to paced rhythm. Left ventricular diastolic function could not be evaluated.  Right Ventricle: The right ventricular size is mildly enlarged. No increase in right ventricular wall thickness. Right ventricular systolic function is moderately reduced. There is moderately elevated pulmonary artery systolic pressure. The tricuspid regurgitant velocity is 3.21 m/s, and with an assumed right atrial pressure of 15 mmHg, the estimated right ventricular systolic pressure is 56.2 mmHg.  Left Atrium: Left atrial size was moderately  dilated.  Right Atrium: Right atrial size was severely dilated.  Pericardium: There is no evidence of pericardial effusion.  Mitral Valve: The mitral valve is normal in structure. Mild mitral annular calcification. Trivial mitral valve regurgitation. No evidence of mitral valve stenosis.  Tricuspid Valve: The tricuspid valve is normal in structure. Tricuspid valve regurgitation is moderate to severe. No evidence of tricuspid stenosis.  Aortic Valve: The aortic valve is normal in structure. Aortic valve regurgitation is trivial. No aortic stenosis is present.  Pulmonic Valve: The pulmonic valve was normal in structure. Pulmonic valve regurgitation is mild. No evidence of pulmonic stenosis.  Aorta: The aortic root is normal in size and structure.  Venous: The inferior vena cava is dilated in size with less than 50% respiratory variability, suggesting right atrial pressure of 15 mmHg.  IAS/Shunts: No atrial level shunt detected by color flow Doppler.  Additional Comments: A device lead is visualized.   LEFT VENTRICLE PLAX 2D LVIDd:         3.50 cm LVIDs:         2.50 cm         2D LV PW:         0.80 cm         Longitudinal LV IVS:        0.80 cm  Strain LVOT diam:     1.90 cm         2D Strain GLS  -16.6 % LV SV:         48              (A2C): LV SV Index:   23              2D Strain GLS  -15.7 % LVOT Area:     2.84 cm        (A3C): 2D Strain GLS  -13.4 % (A4C): LV Volumes (MOD)               2D Strain GLS  -15.3 % LV vol d, MOD    72.5 ml       Avg: A2C: LV vol d, MOD    54.3 ml       3D Volume EF A4C:                           LV 3D EF:    Left LV vol s, MOD    27.3 ml                    ventricul A2C:                                        ar LV vol s, MOD    21.9 ml                    ejection A4C:                                        fraction LV SV MOD A2C:   45.2 ml                    by 3D LV SV MOD A4C:   54.3 ml                    volume is LV SV MOD BP:     40.2 ml                    54 %.  3D Volume EF: 3D EF:        54 % LV EDV:       107 ml LV ESV:       49 ml LV SV:        58 ml  RIGHT VENTRICLE RV S prime:     8.11 cm/s TAPSE (M-mode): 1.4 cm  LEFT ATRIUM             Index        RIGHT ATRIUM           Index LA diam:        5.30 cm 2.55 cm/m   RA Area:     21.90 cm LA Vol (A2C):   81.4 ml 39.21 ml/m  RA Volume:   64.35 ml  31.00 ml/m LA Vol (A4C):   81.2 ml 39.11 ml/m LA Biplane Vol: 86.1 ml 41.47 ml/m AORTIC VALVE             PULMONIC VALVE LVOT Vmax:   77.90 cm/s  PV Vmax:  0.51 m/s LVOT Vmean:  53.300 cm/s PV Vmean:         36.000 cm/s LVOT VTI:    0.170 m     PV VTI:           0.114 m PV Peak grad:     1.0 mmHg AORTA                    PV Mean grad:     1.0 mmHg Ao Root diam: 3.10 cm    PR End Diast Vel: 3.91 msec Ao Asc diam:  3.60 cm   TRICUSPID VALVE TR Peak grad:   41.2 mmHg TR Vmax:        321.00 cm/s  SHUNTS Systemic VTI:  0.17 m Systemic Diam: 1.90 cm  Wilbert Bihari MD Electronically signed by Wilbert Bihari MD Signature Date/Time: 02/26/2023/3:16:26 PM    Final          ______________________________________________________________________________________________       Risk Assessment/Calculations  CHA2DS2-VASc Score = 6  This indicates a 9.7% annual risk of stroke. The patient's score is based upon: CHF History: 0 HTN History: 1 Diabetes History: 1 Stroke History: 0 Vascular Disease History: 1 Age Score: 2 Gender Score: 1  Physical Exam VS:  BP 124/60 (BP Location: Right Arm, Patient Position: Sitting, Cuff Size: Large)   Pulse 78   Ht 5' 1 (1.549 m)   Wt 220 lb 9.6 oz (100.1 kg)   SpO2 98%   BMI 41.68 kg/m        Wt Readings from Last 3 Encounters:  10/28/24 220 lb 9.6 oz (100.1 kg)  01/21/24 212 lb 6.4 oz (96.3 kg)  01/03/24 216 lb (98 kg)    GEN: Well nourished, well developed in no acute distress.  Sitting comfortably on the exam table NECK: No JVD   CARDIAC:  RRR, no murmurs, rubs, gallops.  Radial pulses 2+ bilaterally RESPIRATORY:  Clear to auscultation without rales, wheezing or rhonchi.  Normal work of breathing on room air ABDOMEN: Soft, non-tender, non-distended.  No pulsatile masses noted. EXTREMITIES:  No edema in bilateral lower extremities; No deformity   ASSESSMENT AND PLAN  Persistent Atrial fibrillation/flutter  - EKG today shows A-sensed, v-paced rhythm with HR 78 BPM - No palpitations in her chest. No racing heart beats. Occasionally has palpitations in her stomach after she eats a large meal. These resolve if she rubs her stomach. Likely GI related  - Ordered BMP, mag, CBC  - Device interrogation from 07/2024 showed persistent atrial fibrillation with overall controlled rates - Continue xarelto  20 mg daily - patient denies bleeding or bruising on AC  - Continue diltiazem  120 mg daily, metoprolol  succinate 100 mg daily   HTN  - BP well-controlled, no symptoms concerning for orthostatic hypotension - Continue diltiazem  120 mg daily, metoprolol  succinate 100 mg daily, losartan  50 mg daily - Ordered repeat BMP for medication monitoring  HLD  - Lipid panel from 12/2023 showed LDL 40, HDL 69, triglycerides 56, total cholesterol 122 - Patient currently takes Crestor  10 mg every other day.  Is concerned that this medication is contributing to joint pain. - Discussed taking 3 weeks off of her Crestor  to see if her symptoms improve.  Instructed patient to call us  at the 3-week mark to discuss further.  If her joint pain has improved, we will keep her off Crestor .  If not, we will plan to resume   Tachy-brady, s/p ppm  - Previously followed by Dr.  Fernande.  - Most recent device check from 07/2024 showed normal device function - Patient is due for annual EP follow-up.  Scheduled this today.  Pulm HTN  - Echocardiogram from 02/2023 showed EF 55-60%, no wall motion abnormalities, moderately reduced RV function, moderately elevated  PA systolic pressure  - We are managing conservatively  - Denies SOB. Is euvolemic on exam  - Continue lasix  20 mg daily  - Ordered BMP    Dispo: Follow up with EP in the next 2-3 months. Follow up with gen cards in 1 year   Signed, Rollo FABIENE Louder, PA-C

## 2024-10-29 ENCOUNTER — Ambulatory Visit: Payer: Self-pay | Admitting: Cardiology

## 2024-10-29 DIAGNOSIS — Z79899 Other long term (current) drug therapy: Secondary | ICD-10-CM

## 2024-10-29 LAB — CUP PACEART REMOTE DEVICE CHECK
Battery Remaining Longevity: 102 mo
Battery Voltage: 2.99 V
Brady Statistic AP VP Percent: 9.85 %
Brady Statistic AP VS Percent: 42.79 %
Brady Statistic AS VP Percent: 36.8 %
Brady Statistic AS VS Percent: 10.43 %
Brady Statistic RA Percent Paced: 5.03 %
Brady Statistic RV Percent Paced: 68.54 %
Date Time Interrogation Session: 20251203012658
Implantable Lead Connection Status: 753985
Implantable Lead Connection Status: 753985
Implantable Lead Implant Date: 20201207
Implantable Lead Location: 753859
Implantable Lead Location: 753860
Implantable Lead Model: 4076
Implantable Lead Model: 4076
Implantable Pulse Generator Implant Date: 20201207
Lead Channel Impedance Value: 304 Ohm
Lead Channel Impedance Value: 380 Ohm
Lead Channel Impedance Value: 418 Ohm
Lead Channel Impedance Value: 475 Ohm
Lead Channel Pacing Threshold Amplitude: 0.625 V
Lead Channel Pacing Threshold Amplitude: 0.75 V
Lead Channel Pacing Threshold Pulse Width: 0.4 ms
Lead Channel Pacing Threshold Pulse Width: 0.4 ms
Lead Channel Sensing Intrinsic Amplitude: 15.625 mV
Lead Channel Sensing Intrinsic Amplitude: 15.625 mV
Lead Channel Sensing Intrinsic Amplitude: 4 mV
Lead Channel Sensing Intrinsic Amplitude: 4 mV
Lead Channel Setting Pacing Amplitude: 1.5 V
Lead Channel Setting Pacing Amplitude: 2.5 V
Lead Channel Setting Pacing Pulse Width: 0.4 ms
Lead Channel Setting Sensing Sensitivity: 0.9 mV
Zone Setting Status: 755011
Zone Setting Status: 755011

## 2024-10-29 LAB — CBC
Hematocrit: 36.2 % (ref 34.0–46.6)
Hemoglobin: 11.7 g/dL (ref 11.1–15.9)
MCH: 31.5 pg (ref 26.6–33.0)
MCHC: 32.3 g/dL (ref 31.5–35.7)
MCV: 97 fL (ref 79–97)
Platelets: 178 x10E3/uL (ref 150–450)
RBC: 3.72 x10E6/uL — ABNORMAL LOW (ref 3.77–5.28)
RDW: 12.7 % (ref 11.7–15.4)
WBC: 4.7 x10E3/uL (ref 3.4–10.8)

## 2024-10-29 LAB — BASIC METABOLIC PANEL WITH GFR
BUN/Creatinine Ratio: 19 (ref 12–28)
BUN: 20 mg/dL (ref 8–27)
CO2: 25 mmol/L (ref 20–29)
Calcium: 8.9 mg/dL (ref 8.7–10.3)
Chloride: 103 mmol/L (ref 96–106)
Creatinine, Ser: 1.06 mg/dL — ABNORMAL HIGH (ref 0.57–1.00)
Glucose: 96 mg/dL (ref 70–99)
Potassium: 3.2 mmol/L — ABNORMAL LOW (ref 3.5–5.2)
Sodium: 144 mmol/L (ref 134–144)
eGFR: 53 mL/min/1.73 — ABNORMAL LOW (ref >59)

## 2024-10-29 LAB — MAGNESIUM: Magnesium: 2 mg/dL (ref 1.6–2.3)

## 2024-10-29 MED ORDER — POTASSIUM CHLORIDE ER 20 MEQ PO TBCR: 20.0000 meq | EXTENDED_RELEASE_TABLET | Freq: Every day | ORAL | 2 refills | Status: AC

## 2024-10-29 NOTE — Telephone Encounter (Signed)
-----   Message from Rollo FABIENE Louder sent at 10/29/2024  8:06 AM EST ----- Good Morning  Please tell patient that her lab work from yesterday showed stable kidney function. Potassium is low at 3.2. With this, she should take potassium chloride  40 mEq for the next 3 days. Then, take 20  mEq daily. Looks like she has a prescription for potassium, but please send in prescription if needed. Repeat BMP in 1 week.   CBC and mag normal   Thanks KJ  ----- Message ----- From: Interface, [facility] Lab Results In Sent: 10/28/2024  10:41 PM EST To: Rollo JONELLE Louder, PA-C

## 2024-10-29 NOTE — Telephone Encounter (Signed)
 Lvm for patient to call back clinic  [phone]  about results with recommendations

## 2024-10-29 NOTE — Telephone Encounter (Signed)
 Pt returning call

## 2024-11-03 NOTE — Progress Notes (Signed)
 Remote PPM Transmission

## 2024-11-05 ENCOUNTER — Other Ambulatory Visit: Payer: Self-pay | Admitting: Medical

## 2024-11-05 NOTE — Telephone Encounter (Signed)
 Pt is overdue for an appt. She has canceled and no showed since her last visit earlier this year

## 2024-11-09 ENCOUNTER — Other Ambulatory Visit: Payer: Self-pay | Admitting: Medical

## 2024-11-09 NOTE — Telephone Encounter (Signed)
 Pt is overdue for this. Please schedule patient. Once schedule I can refill until appt

## 2024-11-10 NOTE — Telephone Encounter (Signed)
 Please call to schedule an appointment with Darlene Boyer for med check or physical

## 2024-11-15 ENCOUNTER — Ambulatory Visit (HOSPITAL_COMMUNITY): Admission: EM | Admit: 2024-11-15 | Discharge: 2024-11-15 | Disposition: A | Discharge status: Home / Self Care

## 2024-11-15 ENCOUNTER — Encounter (HOSPITAL_COMMUNITY): Payer: Self-pay

## 2024-11-15 DIAGNOSIS — Z7901 Long term (current) use of anticoagulants: Secondary | ICD-10-CM | POA: Insufficient documentation

## 2024-11-15 DIAGNOSIS — Z7984 Long term (current) use of oral hypoglycemic drugs: Secondary | ICD-10-CM | POA: Insufficient documentation

## 2024-11-15 DIAGNOSIS — M5459 Other low back pain: Secondary | ICD-10-CM | POA: Diagnosis not present

## 2024-11-15 DIAGNOSIS — M545 Low back pain, unspecified: Secondary | ICD-10-CM | POA: Insufficient documentation

## 2024-11-15 DIAGNOSIS — R1031 Right lower quadrant pain: Secondary | ICD-10-CM | POA: Diagnosis not present

## 2024-11-15 DIAGNOSIS — Z79899 Other long term (current) drug therapy: Secondary | ICD-10-CM | POA: Insufficient documentation

## 2024-11-15 LAB — POCT URINE DIPSTICK
Bilirubin, UA: NEGATIVE
Glucose, UA: NEGATIVE mg/dL
Ketones, POC UA: NEGATIVE mg/dL
Nitrite, UA: NEGATIVE
POC PROTEIN,UA: NEGATIVE
Spec Grav, UA: <=1.005 — AB
Urobilinogen, UA: 0.2 U/dL
pH, UA: 5

## 2024-11-15 MED ORDER — PREDNISONE 20 MG PO TABS: 20.0000 mg | ORAL_TABLET | Freq: Every day | ORAL | 0 refills | Status: AC

## 2024-11-15 MED ORDER — BACLOFEN 10 MG PO TABS: 10.0000 mg | ORAL_TABLET | Freq: Three times a day (TID) | ORAL | 0 refills | Status: AC

## 2024-11-15 NOTE — ED Provider Notes (Signed)
 " [facility]  Note:  This document was prepared using Dragon voice recognition software and may include unintentional dictation errors.  MRN: [id number] DOB: December 06, 1940  Subjective:   Darlene Boyer is a 83 y.o. female presenting for right-sided lower back pain that radiates around to the right lower abdomen since yesterday.  Patient reports that prior to the onset of pain she was taking trash bags back-and-forth to the dumpster, patient reports that trash bags were fairly heavy.  Patient is concerned that she may have pulled a muscle lifting the trash bags.  Patient also reports she lifted a 24 pack of water that also could have injured her back.  Patient denies any significant history of back pain.  No other medical concern at this time.  Patient denies any dysuria, increased urinary frequency, flank pain, suprapubic abdominal pressure.  Current Medications[1]   Allergies[2]  Past Medical History:  Diagnosis Date   Arthritis    Atrial fibrillation (HCC)    Diabetes mellitus without complication (HCC)    Hypertension    Obesity    Thyroid  disease      Past Surgical History:  Procedure Laterality Date   ABDOMINAL HYSTERECTOMY     partial   ANKLE FRACTURE SURGERY Right    COLONOSCOPY  01/27/2020   EYE SURGERY     Lens implants   FOOT SURGERY Left    Pins in 3 toes   GALLBLADDER SURGERY     PACEMAKER IMPLANT N/A 11/02/2019   Procedure: PACEMAKER IMPLANT;  Surgeon: Fernande Elspeth BROCKS, MD;  Location: [facility] INVASIVE CV LAB;  Service: Cardiovascular;  Laterality: N/A;   TRIGGER FINGER RELEASE Right 11/05/2023   Procedure: RELEASE TRIGGER FINGER/A-1 PULLEY;  Surgeon: Murrell Drivers, MD;  Location: [facility];  Service: Orthopedics;  Laterality: Right;   TUBAL LIGATION     UPPER GASTROINTESTINAL ENDOSCOPY  01/27/2020    Family History  Problem Relation Age of Onset   Emphysema Father    Cancer Mother        told lung and breast   Colon cancer Neg Hx     Esophageal cancer Neg Hx    Rectal cancer Neg Hx    Stomach cancer Neg Hx     Social History[3]  ROS Refer to HPI for ROS details.  Objective:    Vitals: BP 133/75 (BP Location: Right Arm)   Pulse 88   Temp 98.7 F (37.1 C) (Oral)   Resp 16   SpO2 98%   Physical Exam Vitals and nursing note reviewed.  Constitutional:      General: She is not in acute distress.    Appearance: She is well-developed. She is not ill-appearing or toxic-appearing.  HENT:     Head: Normocephalic and atraumatic.  Cardiovascular:     Rate and Rhythm: Normal rate.  Pulmonary:     Effort: Pulmonary effort is normal. No respiratory distress.     Breath sounds: No stridor. No wheezing.  Abdominal:     General: There is no distension.     Palpations: Abdomen is soft.     Tenderness: There is abdominal tenderness (Mild right lower abdomen pressure upon standing, no pain with direct palpation to the area or at rest.). There is no right CVA tenderness or left CVA tenderness.  Skin:    General: Skin is warm and dry.  Neurological:     General: No focal deficit present.     Mental Status: She is alert and oriented to person, place,  and time.  Psychiatric:        Mood and Affect: Mood normal.        Behavior: Behavior normal.     Procedures  Results for orders placed or performed during the hospital encounter of 11/15/24 (from the past 24 hours)  POC Urinalysis Dipstick     Status: Abnormal   Collection Time: 11/15/24  4:38 PM  Result Value Ref Range   Color, UA yellow yellow   Clarity, UA clear clear   Glucose, UA negative negative mg/dL   Bilirubin, UA negative negative   Ketones, POC UA negative negative mg/dL   Spec Grav, UA <=8.994 (A) 1.010 - 1.025   Blood, UA small (A) negative   pH, UA 5.0 5.0 - 8.0   POC PROTEIN,UA negative negative, trace   Urobilinogen, UA 0.2 0.2 or 1.0 E.U./dL   Nitrite, UA Negative Negative   Leukocytes, UA Trace (A) Negative    Assessment and Plan :      Discharge Instructions       1. Acute right-sided low back pain without sciatica (Primary) - POC Urinalysis Dipstick completed in UC shows trace leukocytes, small blood, these findings are possibly indicative of urinary infection, no nitrite, urine culture will be sent for further evaluation. - predniSONE  (DELTASONE ) 20 MG tablet; Take 1 tablet (20 mg total) by mouth daily for 5 days.  Dispense: 5 tablet; Refill: 0 - baclofen  (LIORESAL ) 10 MG tablet; Take 1 tablet (10 mg total) by mouth 3 (three) times daily.  Dispense: 30 each; Refill: 0 - Urine Culture collected in UC and sent to lab for further testing results should be available in 2 to 3 days.  -Continue to monitor symptoms for any change in severity if there is any escalation of current symptoms or development of new symptoms follow-up in ER for further evaluation and management.      Carlo Lorson B Mahki Spikes    [1] No current facility-administered medications for this encounter.  Current Outpatient Medications:    baclofen  (LIORESAL ) 10 MG tablet, Take 1 tablet (10 mg total) by mouth 3 (three) times daily., Disp: 30 each, Rfl: 0   predniSONE  (DELTASONE ) 20 MG tablet, Take 1 tablet (20 mg total) by mouth daily for 5 days., Disp: 5 tablet, Rfl: 0   diltiazem  (CARDIZEM  CD) 120 MG 24 hr capsule, TAKE 1 CAPSULE BY MOUTH EVERY DAY, Disp: 90 capsule, Rfl: 2   Dulaglutide  (TRULICITY ) 1.5 MG/0.5ML SOAJ, INJECT 1.5 MG SUBCUTANEOUSLY ONCE A WEEK, Disp: 6 mL, Rfl: 1   ferrous gluconate  (FERGON) 324 MG tablet, Take 1 tablet (324 mg total) by mouth daily with breakfast., Disp: 90 tablet, Rfl: 0   furosemide  (LASIX ) 20 MG tablet, TAKE 1 TABLET BY MOUTH EVERY DAY, Disp: 90 tablet, Rfl: 3   guaiFENesin  (MUCINEX  PO), Take 1 tablet by mouth every 12 (twelve) hours. (Patient not taking: Reported on 10/28/2024), Disp: , Rfl:    levothyroxine  (SYNTHROID ) 88 MCG tablet, TAKE 1 TABLET BY MOUTH EVERY DAY, Disp: 30 tablet, Rfl: 0   losartan  (COZAAR ) 50  MG tablet, TAKE 1 TABLET BY MOUTH EVERY DAY, Disp: 90 tablet, Rfl: 0   metoprolol  succinate (TOPROL -XL) 100 MG 24 hr tablet, Take 1 tablet (100 mg total) by mouth daily., Disp: 90 tablet, Rfl: 3   potassium chloride  20 MEQ TBCR, Take 1 tablet (20 mEq total) by mouth daily. 3 DAYS ONLY TAKE  40 MEQ  THEN RESUME  20 MEQ DAILY, Disp: 90 tablet, Rfl: 2   rosuvastatin  (  CRESTOR ) 10 MG tablet, TAKE 1 TABLET BY MOUTH EVERY DAY (Patient taking differently: Take 10 mg by mouth daily. Patient taking every other day), Disp: 30 tablet, Rfl: 0   XARELTO  20 MG TABS tablet, TAKE 1 TABLET BY MOUTH DAILY WITH SUPPER, Disp: 90 tablet, Rfl: 1 [2]  Allergies Allergen Reactions   Pollen Extract     And dust  [3]  Social History Tobacco Use   Smoking status: Never   Smokeless tobacco: Current    Types: Snuff   Tobacco comments:    every now and then  Vaping Use   Vaping status: Never Used  Substance Use Topics   Alcohol use: No    Alcohol/week: 0.0 standard drinks of alcohol   Drug use: No     Aurea Goodell B, NP 11/15/24 1659  "

## 2024-11-15 NOTE — ED Triage Notes (Signed)
 Patient here today with c/o right side low back pain that radiates around to the right lower abdomin since yesterday. Patient states that the pain is when she goes from sitting to standing and walking. Patient states that the pain started after going back and forth taking trash to the dumpster. Patient states that the bags were a little heavy.

## 2024-11-15 NOTE — Discharge Instructions (Addendum)
" °  1. Acute right-sided low back pain without sciatica (Primary) - POC Urinalysis Dipstick completed in UC shows trace leukocytes, small blood, these findings are possibly indicative of urinary infection, no nitrite, urine culture will be sent for further evaluation. - predniSONE  (DELTASONE ) 20 MG tablet; Take 1 tablet (20 mg total) by mouth daily for 5 days.  Dispense: 5 tablet; Refill: 0 - baclofen  (LIORESAL ) 10 MG tablet; Take 1 tablet (10 mg total) by mouth 3 (three) times daily.  Dispense: 30 each; Refill: 0 - Urine Culture collected in UC and sent to lab for further testing results should be available in 2 to 3 days.  -Continue to monitor symptoms for any change in severity if there is any escalation of current symptoms or development of new symptoms follow-up in ER for further evaluation and management. "

## 2024-11-16 LAB — URINE CULTURE: Special Requests: NORMAL

## 2024-11-18 ENCOUNTER — Ambulatory Visit (HOSPITAL_COMMUNITY): Payer: Self-pay

## 2024-12-04 ENCOUNTER — Other Ambulatory Visit: Payer: Self-pay | Admitting: Podiatry

## 2024-12-04 ENCOUNTER — Other Ambulatory Visit: Payer: Self-pay | Admitting: Medical

## 2024-12-04 DIAGNOSIS — L03032 Cellulitis of left toe: Secondary | ICD-10-CM

## 2024-12-07 ENCOUNTER — Other Ambulatory Visit: Payer: Self-pay | Admitting: Medical

## 2025-01-04 ENCOUNTER — Ambulatory Visit: Admitting: Podiatry

## 2025-01-27 ENCOUNTER — Ambulatory Visit

## 2025-04-28 ENCOUNTER — Ambulatory Visit

## 2025-07-28 ENCOUNTER — Ambulatory Visit
